# Patient Record
Sex: Female | Born: 1970 | Race: White | Hispanic: No | Smoking: Never smoker
Health system: Southern US, Community
[De-identification: ages and names within clinical notes are randomized; demographics above are authoritative.]

## PROBLEM LIST (undated history)

## (undated) DIAGNOSIS — K219 Gastro-esophageal reflux disease without esophagitis: Secondary | ICD-10-CM

## (undated) DIAGNOSIS — R609 Edema, unspecified: Secondary | ICD-10-CM

## (undated) DIAGNOSIS — B977 Papillomavirus as the cause of diseases classified elsewhere: Secondary | ICD-10-CM

## (undated) DIAGNOSIS — IMO0001 Reserved for inherently not codable concepts without codable children: Secondary | ICD-10-CM

## (undated) DIAGNOSIS — B379 Candidiasis, unspecified: Secondary | ICD-10-CM

## (undated) DIAGNOSIS — N6019 Diffuse cystic mastopathy of unspecified breast: Secondary | ICD-10-CM

## (undated) DIAGNOSIS — J329 Chronic sinusitis, unspecified: Secondary | ICD-10-CM

## (undated) DIAGNOSIS — T7840XA Allergy, unspecified, initial encounter: Secondary | ICD-10-CM

## (undated) DIAGNOSIS — F32A Depression, unspecified: Secondary | ICD-10-CM

## (undated) DIAGNOSIS — N390 Urinary tract infection, site not specified: Secondary | ICD-10-CM

## (undated) DIAGNOSIS — F329 Major depressive disorder, single episode, unspecified: Secondary | ICD-10-CM

## (undated) DIAGNOSIS — R569 Unspecified convulsions: Secondary | ICD-10-CM

## (undated) DIAGNOSIS — I77810 Thoracic aortic ectasia: Secondary | ICD-10-CM

## (undated) DIAGNOSIS — Q249 Congenital malformation of heart, unspecified: Secondary | ICD-10-CM

## (undated) DIAGNOSIS — S0300XA Dislocation of jaw, unspecified side, initial encounter: Secondary | ICD-10-CM

## (undated) HISTORY — DX: Allergy, unspecified, initial encounter: T78.40XA

## (undated) HISTORY — DX: Gastro-esophageal reflux disease without esophagitis: K21.9

## (undated) HISTORY — DX: Major depressive disorder, single episode, unspecified: F32.9

## (undated) HISTORY — DX: Diffuse cystic mastopathy of unspecified breast: N60.19

## (undated) HISTORY — DX: Papillomavirus as the cause of diseases classified elsewhere: B97.7

## (undated) HISTORY — DX: Congenital malformation of heart, unspecified: Q24.9

## (undated) HISTORY — DX: Edema, unspecified: R60.9

## (undated) HISTORY — DX: Depression, unspecified: F32.A

## (undated) HISTORY — DX: Dislocation of jaw, unspecified side, initial encounter: S03.00XA

## (undated) HISTORY — PX: EYE SURGERY: SHX253

## (undated) HISTORY — DX: Thoracic aortic ectasia: I77.810

---
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
## 2001-05-11 ENCOUNTER — Other Ambulatory Visit: Admission: RE | Admit: 2001-05-11 | Discharge: 2001-05-11 | Payer: Self-pay | Admitting: Obstetrics and Gynecology

## 2004-02-28 ENCOUNTER — Ambulatory Visit (HOSPITAL_COMMUNITY): Admission: RE | Admit: 2004-02-28 | Discharge: 2004-02-28 | Payer: Self-pay | Admitting: Cardiovascular Disease

## 2005-02-09 ENCOUNTER — Ambulatory Visit: Payer: Self-pay | Admitting: Family Medicine

## 2005-04-30 ENCOUNTER — Ambulatory Visit: Payer: Self-pay | Admitting: Family Medicine

## 2005-07-07 ENCOUNTER — Ambulatory Visit: Payer: Self-pay | Admitting: Cardiovascular Disease

## 2006-05-03 ENCOUNTER — Ambulatory Visit: Payer: Self-pay

## 2006-05-03 ENCOUNTER — Ambulatory Visit: Payer: Self-pay | Admitting: Cardiovascular Disease

## 2006-05-03 ENCOUNTER — Encounter: Payer: Self-pay | Admitting: Cardiovascular Disease

## 2006-06-12 ENCOUNTER — Encounter: Payer: Self-pay | Admitting: Family Medicine

## 2006-07-07 ENCOUNTER — Ambulatory Visit: Payer: Self-pay | Admitting: Family Medicine

## 2007-05-10 ENCOUNTER — Ambulatory Visit: Payer: Self-pay | Admitting: Cardiovascular Disease

## 2007-05-26 ENCOUNTER — Encounter: Admission: RE | Admit: 2007-05-26 | Discharge: 2007-05-26 | Payer: Self-pay | Admitting: Obstetrics and Gynecology

## 2007-06-13 LAB — CONVERTED CEMR LAB: Pap Smear: NORMAL

## 2007-09-19 ENCOUNTER — Telehealth: Payer: Self-pay | Admitting: Family Medicine

## 2007-10-13 HISTORY — PX: CRYOTHERAPY: SHX1416

## 2007-11-02 ENCOUNTER — Encounter: Payer: Self-pay | Admitting: Family Medicine

## 2007-11-02 DIAGNOSIS — J309 Allergic rhinitis, unspecified: Secondary | ICD-10-CM | POA: Insufficient documentation

## 2007-11-02 DIAGNOSIS — Q231 Congenital insufficiency of aortic valve: Secondary | ICD-10-CM

## 2007-11-02 DIAGNOSIS — F341 Dysthymic disorder: Secondary | ICD-10-CM | POA: Insufficient documentation

## 2007-11-02 DIAGNOSIS — Z8659 Personal history of other mental and behavioral disorders: Secondary | ICD-10-CM | POA: Insufficient documentation

## 2007-11-11 ENCOUNTER — Ambulatory Visit: Payer: Self-pay | Admitting: Family Medicine

## 2007-11-14 LAB — CONVERTED CEMR LAB
AST: 19 units/L (ref 0–37)
BUN: 7 mg/dL (ref 6–23)
Basophils Absolute: 0 10*3/uL (ref 0.0–0.1)
Basophils Relative: 0.5 % (ref 0.0–1.0)
Bilirubin, Direct: 0.1 mg/dL (ref 0.0–0.3)
Calcium: 9.1 mg/dL (ref 8.4–10.5)
Chloride: 104 meq/L (ref 96–112)
Creatinine, Ser: 0.6 mg/dL (ref 0.4–1.2)
Eosinophils Absolute: 0.1 10*3/uL (ref 0.0–0.6)
Eosinophils Relative: 2 % (ref 0.0–5.0)
HCT: 39.5 % (ref 36.0–46.0)
HDL: 44.5 mg/dL (ref >39.0)
Lymphocytes Relative: 32.4 % (ref 12.0–46.0)
MCHC: 33 g/dL (ref 30.0–36.0)
Monocytes Relative: 11.3 % — ABNORMAL HIGH (ref 3.0–11.0)
Neutro Abs: 3.5 10*3/uL (ref 1.4–7.7)
Neutrophils Relative %: 53.8 % (ref 43.0–77.0)
Potassium: 3.6 meq/L (ref 3.5–5.1)
RBC: 4.27 M/uL (ref 3.87–5.11)
RDW: 12.5 % (ref 11.5–14.6)
VLDL: 14 mg/dL (ref 0–40)

## 2008-03-23 ENCOUNTER — Telehealth: Payer: Self-pay | Admitting: Family Medicine

## 2008-06-12 LAB — CONVERTED CEMR LAB: Pap Smear: NORMAL

## 2008-06-28 ENCOUNTER — Telehealth (INDEPENDENT_AMBULATORY_CARE_PROVIDER_SITE_OTHER): Payer: Self-pay | Admitting: *Deleted

## 2008-07-05 ENCOUNTER — Encounter: Payer: Self-pay | Admitting: Family Medicine

## 2008-10-16 ENCOUNTER — Ambulatory Visit: Payer: Self-pay | Admitting: Cardiovascular Disease

## 2008-10-16 ENCOUNTER — Ambulatory Visit: Payer: Self-pay

## 2008-10-16 ENCOUNTER — Encounter: Payer: Self-pay | Admitting: Cardiovascular Disease

## 2008-11-13 ENCOUNTER — Ambulatory Visit: Payer: Self-pay | Admitting: Family Medicine

## 2008-11-14 LAB — CONVERTED CEMR LAB
AST: 16 units/L (ref 0–37)
Basophils Absolute: 0.1 10*3/uL (ref 0.0–0.1)
Basophils Relative: 0.8 % (ref 0.0–3.0)
Chloride: 108 meq/L (ref 96–112)
Cholesterol: 148 mg/dL (ref 0–200)
Creatinine, Ser: 0.7 mg/dL (ref 0.4–1.2)
Eosinophils Absolute: 0.2 10*3/uL (ref 0.0–0.7)
Eosinophils Relative: 3 % (ref 0.0–5.0)
GFR calc non Af Amer: 99 mL/min
HCT: 39.6 % (ref 36.0–46.0)
HDL: 43.5 mg/dL (ref >39.0)
Hemoglobin: 13.6 g/dL (ref 12.0–15.0)
LDL Cholesterol: 96 mg/dL (ref 0–99)
Lymphocytes Relative: 31.5 % (ref 12.0–46.0)
MCV: 91.3 fL (ref 78.0–100.0)
Monocytes Absolute: 1 10*3/uL (ref 0.1–1.0)
Neutrophils Relative %: 52.2 % (ref 43.0–77.0)
RDW: 12.7 % (ref 11.5–14.6)
Sodium: 140 meq/L (ref 135–145)
Total Bilirubin: 0.8 mg/dL (ref 0.3–1.2)
Total CHOL/HDL Ratio: 3.4
Total Protein: 6.4 g/dL (ref 6.0–8.3)
Triglycerides: 45 mg/dL (ref 0–149)
VLDL: 9 mg/dL (ref 0–40)

## 2009-03-25 ENCOUNTER — Telehealth: Payer: Self-pay | Admitting: Family Medicine

## 2009-06-12 ENCOUNTER — Telehealth: Payer: Self-pay | Admitting: Family Medicine

## 2009-06-19 ENCOUNTER — Telehealth: Payer: Self-pay | Admitting: Family Medicine

## 2009-06-24 ENCOUNTER — Telehealth: Payer: Self-pay | Admitting: Family Medicine

## 2009-11-14 ENCOUNTER — Ambulatory Visit: Payer: Self-pay | Admitting: Family Medicine

## 2009-11-14 DIAGNOSIS — R87810 Cervical high risk human papillomavirus (HPV) DNA test positive: Secondary | ICD-10-CM

## 2009-11-20 ENCOUNTER — Encounter: Payer: Self-pay | Admitting: Family Medicine

## 2009-12-11 ENCOUNTER — Encounter: Payer: Self-pay | Admitting: Family Medicine

## 2009-12-11 ENCOUNTER — Ambulatory Visit: Payer: Self-pay | Admitting: Family Medicine

## 2009-12-11 LAB — HM MAMMOGRAPHY: HM Mammogram: NORMAL

## 2009-12-19 ENCOUNTER — Encounter: Payer: Self-pay | Admitting: Family Medicine

## 2010-11-09 LAB — CONVERTED CEMR LAB: Pap Smear: NORMAL

## 2010-11-11 NOTE — Letter (Signed)
Summary: Results Follow up Letter  [place] [facility]  [address] [place], [place] [address]   Phone: [phone]  Fax: [phone]    11/20/2009 MRN: [id number]  Elizabeth Hayden [address] [place], [place]  [address]  Dear Ms. Hayden,  The following are the results of your recent test(s):  Test         Result    Pap Smear:        Normal _X____  Not Normal _____ Comments:Please repeat in one year. ______________________________________________________ Cholesterol: LDL(Bad cholesterol):         Your goal is less than:         HDL (Good cholesterol):       Your goal is more than: Comments:  ______________________________________________________ Mammogram:        Normal _____  Not Normal _____ Comments:  ___________________________________________________________________ Hemoccult:        Normal _____  Not normal _______ Comments:    _____________________________________________________________________ Other Tests:    We routinely do not discuss normal results over the telephone.  If you desire a copy of the results, or you have any questions about this information we can discuss them at your next office visit.   Sincerely,    Roxy Manns, MD   MT/ri

## 2010-11-11 NOTE — Letter (Signed)
Summary: Results Follow up Letter  [place] [facility]  [address] [place], [place] [address]   Phone: [phone]  Fax: [phone]    12/19/2009 MRN: [id number]    Elizabeth Hayden [address] [place], [place]  [address]    Dear Ms. Hayden,  The following are the results of your recent test(s):  Test         Result    Pap Smear:        Normal _____  Not Normal _____ Comments: ______________________________________________________ Cholesterol: LDL(Bad cholesterol):         Your goal is less than:         HDL (Good cholesterol):       Your goal is more than: Comments:  ______________________________________________________ Mammogram:        Normal __X___  Not Normal _____ Comments:Please repeat in one year.  ___________________________________________________________________ Hemoccult:        Normal _____  Not normal _______ Comments:    _____________________________________________________________________ Other Tests:    We routinely do not discuss normal results over the telephone.  If you desire a copy of the results, or you have any questions about this information we can discuss them at your next office visit.   Sincerely,    Idamae Schuller Tower,MD  MT/ri

## 2010-11-11 NOTE — Assessment & Plan Note (Signed)
Summary: CPX/CLE   Vital Signs:  Patient profile:   40 year old female Height:      67.75 inches Weight:      158 pounds BMI:     24.29 Temp:     98.2 degrees F oral Pulse rate:   72 / minute Pulse rhythm:   regular BP sitting:   110 / 74  (left arm) Cuff size:   regular  Vitals Entered By: Lowella Petties CMA (November 14, 2009 9:50 AM) CC: 30 minute check up   History of Present Illness: here for health mt exam   works in new job - at [employer]-- much less stressful/likes it   is feeling better overall  husb is getting eval for sleep apnea- is happy about that  she will sleep better without the snoring   is not exercising yet  much more healthy diet -- and no more fried foods more veg and chicken  mvi and also now on some calcium with vit   wt is stable-- but she wants to weigh less  bp very good at 110/74  has remote hx of abn pap and leep  pap was 09- will do that today  knows she has hpv high risk  last colposcopy has been a while and last pap was nl   mam 9/08- needs to get that scheduled  has had some lumps in past - [place] , fibrocystic  on self exam-- lumps tend to come and go  Td 05  did get a flu shot at work this year   Allergies: 1)  Ace Inhibitors  Past History:  Past Surgical History: Last updated: 11/02/2007 Coarctation of aorta,  surgery (1975) Eye surgery- strabismus (1985) Abn  paps- colposcopy, biopsy, ? 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 Eyes:  Denies blurring and eye pain. CV:  Denies chest pain or discomfort, palpitations, shortness of breath with exertion, and swelling of feet. Resp:  Denies cough and wheezing. GI:  Denies abdominal pain, change in bowel habits, and indigestion. MS:  Complains of joint pain and stiffness; denies cramps and muscle weakness. Derm:  Denies lesion(s), poor wound healing, and rash. Neuro:  Denies numbness and tingling. Psych:  Denies anxiety and depression. Endo:  Denies cold intolerance, excessive thirst, excessive urination, and heat intolerance. Heme:  Denies abnormal bruising and bleeding.  Physical Exam  General:  Well-developed,well-nourished,in no acute distress; alert,appropriate and cooperative throughout examination Head:  normocephalic, atraumatic, and no abnormalities observed.   Eyes:  vision grossly intact, pupils equal, pupils round, and pupils reactive to light.   Ears:  R ear normal and L ear normal.   Nose:  no nasal discharge.   Mouth:  pharynx pink and moist.   Neck:  supple with full rom and no masses or thyromegally, no JVD or carotid bruit  Chest Wall:  No deformities, masses, or  tenderness noted. Breasts:  No mass, nodules, thickening, tenderness, bulging, retraction, inflamation, nipple discharge or skin changes noted.   Lungs:  Normal respiratory effort, chest expands symmetrically. Lungs are clear to auscultation, no crackles or wheezes. Heart:  Normal rate and regular rhythm. S1 and S2 normal without gallop, murmur, click, rub or other extra sounds. Abdomen:  Bowel sounds positive,abdomen soft and non-tender without masses, organomegaly or hernias noted. no renal bruits  Genitalia:  Normal introitus for age, no external lesions,  no vaginal discharge, mucosa pink and moist, no vaginal or cervical lesions, no vaginal atrophy, no friaility or hemorrhage, normal uterus size and position, no adnexal masses or tenderness Msk:  No deformity or scoliosis noted of thoracic or lumbar spine.  no acute joint changes Pulses:  R and L carotid,radial,femoral,dorsalis pedis and posterior tibial pulses are full and equal bilaterally Extremities:  No clubbing, cyanosis, edema, or deformity noted with normal full range of motion of all joints.   Neurologic:  sensation intact to light touch, gait normal, and DTRs symmetrical and normal.   Skin:  Intact without suspicious lesions or rashes Cervical Nodes:  No lymphadenopathy noted Axillary Nodes:  No palpable lymphadenopathy Inguinal Nodes:  No significant adenopathy Psych:  normal affect, talkative and pleasant    Impression & Recommendations:  Problem # 1:  HEALTH MAINTENANCE EXAM (ICD-V70.0) Assessment Comment Only reviewed health habits including diet, exercise and skin cancer prevention reviewed health maintenance list and family history will send lab to [facility] adv to try to get regular exercise   Problem # 2:  ROUTINE GYNECOLOGICAL EXAMINATION (ICD-V72.31) Assessment: Comment Only with hx of hpv high risk  last 2 paps nl  check today and adv   Problem # 3:  DEPRESSION (ICD-311) Assessment: Improved overall is doing well with less stress  wants to try the generic wellbutrin xl again -- in past did not work as well, but price is going up  given px, will update  recommended good sleep and exercise  Her updated medication list for this problem includes:    Wellbutrin Xl 300 Mg Tb24 (Bupropion hcl) .Marland Kitchen... 1 by mouth once daily  Complete Medication List: 1)  Wellbutrin Xl 300 Mg Tb24 (Bupropion hcl) .Marland Kitchen.. 1 by mouth once daily 2)  Zantac 150 Mg Tabs (Ranitidine hcl) .... One by mouth daily prn  Other Orders: Radiology Referral (Radiology)  Patient Instructions: 1)   keep up the healthy diet  2)  It is important that you exercise reguarly at least 20 minutes 5 times a week. If you develop chest pain, have severe difficulty breathing, or feel very tired, stop exercising immediately and seek medical attention.  3)  labs sent to [facility] today Prescriptions: WELLBUTRIN XL 300 MG  TB24 (BUPROPION HCL) 1 by mouth once daily Brand medically necessary #90 x 3   Entered and Authorized by:   Judith Part MD   Signed by:   Judith Part MD on 11/14/2009   Method used:   Print then Give to Patient   RxID:   [id number]   Handout requested.   Prior Medications (reviewed today): WELLBUTRIN XL 300 MG  TB24 (BUPROPION HCL) 1 by mouth once daily ZANTAC 150 MG  TABS (RANITIDINE HCL) one by mouth daily prn Current Allergies: ACE INHIBITORS    Prevention & Chronic Care Immunizations   Influenza vaccine: given  (07/15/2007)    Tetanus booster: 04/18/2004: Td    Pneumococcal vaccine: Not documented  Other Screening   Pap smear: normal  (  06/12/2008)    Mammogram: normal  (06/13/2007)   Smoking status: quit  (11/02/2007)  Lipids   Total Cholesterol: 148  (11/13/2008)   LDL: 96  (11/13/2008)   LDL Direct: Not documented   HDL: 43.5  (11/13/2008)   Triglycerides: 45  (11/13/2008)

## 2010-11-11 NOTE — Miscellaneous (Signed)
Summary: pap smear prev care  Clinical Lists Changes  Observations: Added new observation of PAP DUE: 11/2010 (11/20/2009 16:37) Added new observation of PAP SMEAR: normal (11/14/2009 16:38)      Preventive Care Screening  Pap Smear:    Date:  11/14/2009    Next Due:  11/2010    Results:  normal  Letter mailed to patient as instructed. Put on patient's preventive care form. Lewanda Rife LPN  November 20, 2009 4:38 PM

## 2010-11-11 NOTE — Miscellaneous (Signed)
Summary: mammogram screening  Clinical Lists Changes  Observations: Added new observation of MAMMO DUE: 12/2010 (12/19/2009 13:49) Added new observation of MAMMOGRAM: normal (12/11/2009 13:50)      Preventive Care Screening  Mammogram:    Date:  12/11/2009    Next Due:  12/2010    Results:  normal

## 2011-01-08 ENCOUNTER — Other Ambulatory Visit: Payer: Self-pay | Admitting: Family Medicine

## 2011-01-08 NOTE — Telephone Encounter (Signed)
Pt needs to call for appt. 

## 2011-02-17 ENCOUNTER — Telehealth: Payer: Self-pay | Admitting: *Deleted

## 2011-02-17 NOTE — Telephone Encounter (Signed)
Pt is [facility] employee and has cpx scheduled. She request an order to take to [facility] for cpx labs.

## 2011-02-17 NOTE — Telephone Encounter (Signed)
Left message for pt to call back  °

## 2011-02-17 NOTE — Telephone Encounter (Signed)
Order done on px pad for PE labs in IN box

## 2011-02-18 NOTE — Telephone Encounter (Signed)
Left message for pt to call back  °

## 2011-02-18 NOTE — Telephone Encounter (Signed)
Patient notified as instructed by telephone. Pt requested lab order mailed to her home address. Copy of order sent for scanning.

## 2011-02-24 NOTE — Assessment & Plan Note (Signed)
[facility]                            CARDIOLOGY OFFICE NOTE   Elizabeth Hayden, Elizabeth Hayden                    MRN:          [id number]  DATE:05/10/2007                            DOB:          [birth year]    Elizabeth Hayden returns today for followup.  She has a history of bicuspid  aortic valve with coarctation repair.   She has been doing well, she has not had any significant PND, orthopnea,  chest pain or palpitations.  She has had a long standing history of  chronic edema, this tends to be generalizes body edema both in her legs,  and her hands and in her face.  She has been on Lasix for a long time,  as far as I can tell there has been not an etiology to this.  She is  being seen by Dr. Milinda Antis, this is clearly not related to her heart, she  says her thyroid has been fine.   REVIEW OF SYSTEMS:  Otherwise negative.   Patient's last echocardiogram which I reviewed the images for prior to  her visit showed an EF of 55% - 60%.  She had a bicuspid aortic valve  with a mean gradient of 5, trivial aortic insufficiency and no evidence  of restenosis of her coarc repair by suprasternal notch imaging.   Her teeth are in good shape, she sees a Dr. Judithann Sheen in [place] twice  a year.  Although she dose not have to, her and her dentist prefer to  continue SBE prophylaxis taking 2 grams of amoxicillin an hour before  any procedure.   CURRENT MEDICATIONS:  1. Wellbutrin 300 a day.  2. Lasix 60 a day.  3. Zantac 150 a day.  4. Multivitamin.   She continues to be under a lot of stress.  She works full time at [employer].  She has a blended family as she was remarried  last year.  There are 3 children living at home, 33 and 75 year old  girls who get along well; she has a 1-year-old down syndrome baby named  Elizabeth Hayden who has had an endocardial cushion defect repair, he is doing  fairly well at schools in [place] now; there is a 28 year old child  who  does not live at the house.   EXAMINATION:  Remarkable for healthy-appearing middle-aged white female  in no distress.  She is afebrile, her weight is 157, respiratory rate is  14, blood pressure is 114/80, pulse 73 and regular.  HEENT:  Normal.  There is no thyromegaly or lymphadenopathy, no bruits,  no JVP elevation.  LUNGS:  Clear with good diaphragmatic motion and no wheezing.  There is  no coarctation murmur in the back.  There is an S1 and S2, I think there  is an opening snap from her bicuspid valve.  There is no aortic  insufficiency murmur, there is a very short systolic ejection murmur.  PMI is normal.  ABDOMEN:  Benign, there is no radial femoral delay, there is no  abdominal aortic aneurysm, no tenderness.  Bowel sounds are positive, no  hepatosplenomegaly or  hepatojugular reflux.  Distal pulses are intact with no edema.  NEURO:  Nonfocal.  SKIN:  Warm and dry.  There is no muscular weakness.   Her EKG shows sinus rhythm with left axis deviation and poor R-wave  progression.   IMPRESSION:  1. History of coarctation repair, no lower extremity weakness.      Followup 2D echocardiogram to further assess.  No murmur in the      back, I suspect it is stable.  2. History of bicuspid aortic valve with trivial aortic stenosis and      aortic insufficiency, followup echo clinically it sounds fine and      she prefers to continue subacute bacterial endocarditis      prophylaxis.  3. History of moderate aortic root dilatation.  The patient's aortic      valve measured 4 cm at the sinus of Valsalva with a maximum      diameter distal of the __________ junction of 4.4 cm.  Her last MRI      was in May of 2005, I suspect we will recheck one next year unless      her aortic root appears large on echo.  4. History of generalized edema, not well characterized, clearly not      related to her heart.  Continue Lasix 60 mg a day, follow up with      Dr. Milinda Antis.  5. History of  depression, continue Wellbutrin 300 a day, seems to be      doing better.  Always a lot of stress in her life due to her      handicapped child but she seems to be tolerating it well.  6. History of reflux and gastroesophageal reflux disease, continue      Zantac p.r.n.   I will see her back in a year.     Noralyn Pick. Eden Emms, MD, [facility]  Electronically Signed    PCN/MedQ  DD: 05/10/2007  DT: 05/11/2007  Job #: [id number]

## 2011-02-27 NOTE — Assessment & Plan Note (Signed)
[facility]                              CARDIOLOGY OFFICE NOTE   NAME:Fickett, SHACOLA SCHUSSLER                      MRN:          [id number]  DATE:05/03/2006                            DOB:          1971/05/30    Shonette returns today for follow-up.  She has a bicuspid valve with previous  coarctation repair.  Interestingly, she has a son who has had an AV canal  repair who was followed at [facility].   She is doing well.  She has recently been remarried.  She is not having any  significant PND, orthopnea, palpitations or chest pain.   We did an MRA of her back in 2005 and she had an aortic root that was in the  4 to 4.1 cm range.  She had an echo today which confirmed this. She does  have a bicuspid aortic valve but there is no significant regurgitation or  stenosis.  She has mild MR.   I talked to her about some of the new SBE prophylaxis recommendations and I  do not think she would need them given the new guide lines.   Her dentition is in good shape.   PHYSICAL EXAMINATION:  GENERAL APPEARANCE:  She looks well.  VITAL SIGNS:  Blood pressure is 115/70, pulse 70 and regular.  NECK:  Carotids were normal.  LUNGS:  Clear.  CARDIOVASCULAR:  There was an S1 and S2 without an audible murmur.  ABDOMEN:  Benign.  EXTREMITIES:  Lower extremities with good pulses and no edema.   IMPRESSION:  Stable bicuspid aortic valve with no significant stenosis or  leakiness, previous coarctation repair, no evidence of restenosis by Doppler  today and by MRI in 2005.  I will see her back in a year.                               Noralyn Pick. Eden Emms, MD, [facility]    PCN/MedQ  DD:  05/03/2006  DT:  05/03/2006  Job #:  [id number]

## 2011-03-03 ENCOUNTER — Other Ambulatory Visit: Payer: Self-pay | Admitting: *Deleted

## 2011-03-03 ENCOUNTER — Encounter: Payer: Self-pay | Admitting: Family Medicine

## 2011-03-03 NOTE — Telephone Encounter (Signed)
Opened error

## 2011-03-04 ENCOUNTER — Ambulatory Visit (INDEPENDENT_AMBULATORY_CARE_PROVIDER_SITE_OTHER): Payer: 59 | Admitting: Family Medicine

## 2011-03-04 ENCOUNTER — Encounter: Payer: Self-pay | Admitting: Family Medicine

## 2011-03-04 VITALS — BP 128/84 | HR 84 | Temp 98.9°F | Ht 67.75 in | Wt 155.2 lb

## 2011-03-04 DIAGNOSIS — N63 Unspecified lump in unspecified breast: Secondary | ICD-10-CM | POA: Insufficient documentation

## 2011-03-04 NOTE — Progress Notes (Signed)
  Subjective:    Patient ID: Elizabeth Hayden, female    DOB: [birth year], 40 y.o.   MRN: [id number]  HPI Here for eval of lump in L breast  Noticed it in winter -- was sore and got better and now bigger and sore again  No skin change or rash No nipple d/c  Noticed one in the past and it went away   Cyst in R breast in past  Fibrocystic breasts   Mam 3/11- was normal   Past Medical History  Diagnosis Date  . Allergy   . Depression   . TMJ (dislocation of temporomandibular joint)     Wears a night gear   . Congenital heart disease     bicuspid aortic valve with coarctation repair 04/1974  . Aortic root dilation   . GERD (gastroesophageal reflux disease)   . Edema   . Fibrocystic breast   . HPV (human papilloma virus) infection     History   Social History  . Marital Status: Married    Spouse Name: N/A    Number of Children: 2  . Years of Education: N/A   Occupational History  . Not on file.   Social History Main Topics  . Smoking status: Never Smoker   . Smokeless tobacco: Not on file  . Alcohol Use: Not on file  . Drug Use: Not on file  . Sexually Active: Not on file   Other Topics Concern  . Not on file   Social History Narrative  . No narrative on file   drinks one diet coke per day  Family History  Problem Relation Age of Onset  . Hypertension Father   . Diabetes Father   . Cancer Father     lung          Review of Systems Review of Systems  Constitutional: Negative for fever, appetite change, fatigue and unexpected weight change.  Eyes: Negative for pain and visual disturbance.  Respiratory: Negative for cough and shortness of breath.   Cardiovascular: Negative.for cp or sob or edema    Gastrointestinal: Negative for nausea, diarrhea and constipation.  Genitourinary: Negative for urgency and frequency.  Skin: Negative for pallor.  Neurological: Negative for weakness, light-headedness, numbness and headaches.  Hematological: Negative for  adenopathy. Does not bruise/bleed easily.  Psychiatric/Behavioral: Negative for dysphoric mood. The patient is not nervous/anxious.          Objective:   Physical Exam  Constitutional: She appears well-developed and well-nourished.  HENT:  Head: Normocephalic and atraumatic.  Eyes: Conjunctivae and EOM are normal. Pupils are equal, round, and reactive to light.  Neck: Normal range of motion. Neck supple. No thyromegaly present.  Cardiovascular: Normal rate, regular rhythm and normal heart sounds.   Pulmonary/Chest: Effort normal and breath sounds normal.  Abdominal: Soft. Bowel sounds are normal.  Genitourinary:       bilat breasts are very dense in texture No skin changes or nipple d/c L breast large 2-3 cm oval mass with 1 cm oval mass next to it -- slt tender (consistent with cysts)   Musculoskeletal: She exhibits no edema.  Lymphadenopathy:    She has no cervical adenopathy.  Neurological: She is alert. She has normal reflexes.  Skin: Skin is warm and dry. No rash noted. No erythema. No pallor.  Psychiatric: She has a normal mood and affect.          Assessment & Plan:

## 2011-03-04 NOTE — Assessment & Plan Note (Signed)
L large and tender breast lump in pt with hx of fibrocystic breasts  sched bilat dx mam and L breast US and will update with results Will avoid tight bras

## 2011-03-04 NOTE — Patient Instructions (Signed)
We will do mammogram and ultrasound referral at check out  If symptoms change- let me know  We will update you as soon as we get a report Try to avoid caffiene

## 2011-03-17 ENCOUNTER — Ambulatory Visit
Admission: RE | Admit: 2011-03-17 | Discharge: 2011-03-17 | Disposition: A | Discharge status: Home / Self Care | Payer: 59 | Source: Ambulatory Visit | Attending: Family Medicine | Admitting: Family Medicine

## 2011-03-17 DIAGNOSIS — N63 Unspecified lump in unspecified breast: Secondary | ICD-10-CM

## 2011-04-17 ENCOUNTER — Other Ambulatory Visit: Payer: Self-pay | Admitting: Family Medicine

## 2011-04-17 DIAGNOSIS — N6001 Solitary cyst of right breast: Secondary | ICD-10-CM

## 2011-04-20 ENCOUNTER — Telehealth: Payer: Self-pay

## 2011-04-20 NOTE — Telephone Encounter (Signed)
Pt had called back to get daughter's lab results and pt wanted Dr Milinda Antis to know that she has appt for ultra sound of right breast on 04/21/11.

## 2011-04-20 NOTE — Telephone Encounter (Signed)
[place] did get her daughter's lab result.

## 2011-04-20 NOTE — Telephone Encounter (Signed)
Thanks for the update  Please make sure she got the lab results -thanks

## 2011-04-21 ENCOUNTER — Ambulatory Visit
Admission: RE | Admit: 2011-04-21 | Discharge: 2011-04-21 | Disposition: A | Discharge status: Home / Self Care | Payer: 59 | Source: Ambulatory Visit | Attending: Family Medicine | Admitting: Family Medicine

## 2011-04-21 DIAGNOSIS — N6001 Solitary cyst of right breast: Secondary | ICD-10-CM

## 2011-05-19 ENCOUNTER — Encounter: Payer: Self-pay | Admitting: Family Medicine

## 2011-05-19 ENCOUNTER — Ambulatory Visit (INDEPENDENT_AMBULATORY_CARE_PROVIDER_SITE_OTHER): Payer: 59 | Admitting: Family Medicine

## 2011-05-19 VITALS — BP 114/76 | HR 72 | Temp 97.9°F | Ht 67.75 in | Wt 152.8 lb

## 2011-05-19 DIAGNOSIS — Z0000 Encounter for general adult medical examination without abnormal findings: Secondary | ICD-10-CM

## 2011-05-19 DIAGNOSIS — Z01419 Encounter for gynecological examination (general) (routine) without abnormal findings: Secondary | ICD-10-CM

## 2011-05-19 NOTE — Assessment & Plan Note (Signed)
Reviewed health habits including diet and exercise and skin cancer prevention Also reviewed health mt list, fam hx and immunizations  Reviewed wellness labs today

## 2011-05-19 NOTE — Patient Instructions (Signed)
Pap today  Keep up healthy diet and exercise - your weight is very good today  Labs look good including cholesterol

## 2011-05-19 NOTE — Progress Notes (Signed)
Subjective:    Patient ID: Elizabeth Hayden, female    DOB: [birth year], 40 y.o.   MRN: [id number]  HPI Here for health mt exam and to review chronic med problems   Wt is down 3 lb Wishes she was thinner- hx of body image issues bmi is 23     Pap-- last time she was here  Needs pap today  hpv in past  Td 05  Mam 6/12- had to have some [place] for cysts Was anxiety provoking  Self exam - no lumps lately   Is feeling good - and doing well  Went on a cruise - very relaxing   Patient Active Problem List  Diagnoses  . EATING DISORDER  . DEPRESSION  . CARDIOMYOPATHY, HYPERTROPHIC, OBSTRUCTIVE  . ALLERGIC RHINITIS  . BICUSPID AORTIC VALVE  . CERV HIGH RISK HUMAN PAPILLOMAVIRUS DNA TEST POS  . Breast lump  . Gynecological examination  . Routine general medical examination at a health care facility   Past Medical History  Diagnosis Date  . Allergy   . Depression   . TMJ (dislocation of temporomandibular joint)     Wears a night gear   . Congenital heart disease     bicuspid aortic valve with coarctation repair 04/1974  . Aortic root dilation   . GERD (gastroesophageal reflux disease)   . Edema   . Fibrocystic breast   . HPV (human papilloma virus) infection    Past Surgical History  Procedure Date  . Coarctation of aorta excision 1975  . Eye surgery    History  Substance Use Topics  . Smoking status: Never Smoker   . Smokeless tobacco: Not on file  . Alcohol Use: Not on file   Family History  Problem Relation Age of Onset  . Hypertension Father   . Diabetes Father   . Cancer Father     lung   . Breast cancer Maternal Grandmother   . Leukemia Maternal Grandmother   . Ovarian cancer Paternal Grandmother    Allergies  Allergen Reactions  . Ace Inhibitors     REACTION: rash and cough   Current Outpatient Prescriptions on File Prior to Visit  Medication Sig Dispense Refill  . buPROPion (WELLBUTRIN XL) 300 MG 24 hr tablet TAKE 1 TABLET BY MOUTH ONCE A DAY  90  tablet  0  . CALCIUM-VITAMIN D PO Take 2 tablets by mouth daily.        . Multiple Vitamin (MULTIVITAMIN) capsule Take 1 capsule by mouth daily.        . ranitidine (ZANTAC) 150 MG tablet Take 150 mg by mouth daily.             Review of Systems Review of Systems  Constitutional: Negative for fever, appetite change, fatigue and unexpected weight change.  Eyes: Negative for pain and visual disturbance.  Respiratory: Negative for cough and shortness of breath.   Cardiovascular: Negative.for cp or palpitations   Gastrointestinal: Negative for nausea, diarrhea and constipation.  Genitourinary: Negative for urgency and frequency.  Skin: Negative for pallor. or rash  Neurological: Negative for weakness, light-headedness, numbness and headaches.  Hematological: Negative for adenopathy. Does not bruise/bleed easily.  Psychiatric/Behavioral: Negative for dysphoric mood. The patient is not nervous/anxious.         Objective:   Physical Exam  Constitutional: She appears well-developed and well-nourished. No distress.  HENT:  Head: Normocephalic and atraumatic.  Right Ear: External ear normal.  Left Ear: External ear normal.  Nose: Nose  normal.  Mouth/Throat: Oropharynx is clear and moist.  Eyes: Conjunctivae and EOM are normal. Pupils are equal, round, and reactive to light.  Neck: Normal range of motion. Neck supple. No JVD present. Carotid bruit is not present. Erythema present. No thyromegaly present.  Cardiovascular: Normal rate, regular rhythm, normal heart sounds and intact distal pulses.   Pulmonary/Chest: Effort normal and breath sounds normal. No respiratory distress. She has no wheezes.  Abdominal: Soft. Bowel sounds are normal. She exhibits no distension and no mass. There is no tenderness. There is no rebound.  Genitourinary: Vagina normal and uterus normal. No breast swelling, tenderness, discharge or bleeding. No vaginal discharge found.  Musculoskeletal: She exhibits no  edema and no tenderness.  Lymphadenopathy:    She has no cervical adenopathy.  Neurological: She is alert. She has normal reflexes. No cranial nerve deficit. Coordination normal.  Skin: Skin is warm and dry. No rash noted. No erythema. No pallor.  Psychiatric: She has a normal mood and affect.          Assessment & Plan:

## 2011-05-20 ENCOUNTER — Encounter: Payer: Self-pay | Admitting: Family Medicine

## 2011-05-23 NOTE — Assessment & Plan Note (Signed)
Annual exam with pap done Remote hx of hpv  No symptoms or problems

## 2011-05-28 ENCOUNTER — Encounter: Payer: Self-pay | Admitting: Family Medicine

## 2011-05-31 ENCOUNTER — Telehealth: Payer: Self-pay | Admitting: Family Medicine

## 2011-05-31 DIAGNOSIS — IMO0002 Reserved for concepts with insufficient information to code with codable children: Secondary | ICD-10-CM

## 2011-05-31 NOTE — Telephone Encounter (Signed)
I will go ahead and refer  

## 2011-05-31 NOTE — Telephone Encounter (Signed)
Message copied by Judy Pimple on Sun May 31, 2011 12:18 PM ------      Message from: Patience Musca      Created: Fri May 29, 2011  5:26 PM      Regarding: GYN referral       Please see result notes.      ----- Message -----         From: Roxy Manns, MD         Sent: 05/28/2011   9:00 PM           To: Yetta Glassman, LPN            Please let pt know that her pap shows some atypical cells and HPV       I want to get her referred to gyn for further eval      Let me know if agreeable

## 2011-06-09 ENCOUNTER — Other Ambulatory Visit: Payer: Self-pay | Admitting: *Deleted

## 2011-06-09 MED ORDER — BUPROPION HCL ER (XL) 300 MG PO TB24: ORAL_TABLET | ORAL | Status: DC

## 2011-06-09 NOTE — Telephone Encounter (Signed)
Will refill electronically  

## 2011-09-21 ENCOUNTER — Telehealth: Payer: Self-pay | Admitting: Cardiovascular Disease

## 2011-09-21 DIAGNOSIS — Q231 Congenital insufficiency of aortic valve: Secondary | ICD-10-CM

## 2011-09-21 DIAGNOSIS — I350 Nonrheumatic aortic (valve) stenosis: Secondary | ICD-10-CM

## 2011-09-21 NOTE — Telephone Encounter (Signed)
FU Call: Pt calling to schedule MRI, however there is not an order in pt chart to schedule MRI. Please return pt call to discuss further.

## 2011-09-21 NOTE — Telephone Encounter (Signed)
PER DR NISHAN PT NEEDS ECHO FOR DX AS  AND CARDIAC MRA FOR BICUSPID  VALVE AND AORTIC ROOT DILTATION

## 2011-09-21 NOTE — Telephone Encounter (Signed)
New message:  Pt stated she was to have an MRI this year and has not heard from anyone regarding this.  Wanted to know if this is suppose to be done this year.  Please call pt and advise.

## 2011-09-22 NOTE — Telephone Encounter (Signed)
PT AWARE OF TEST THAT NEED TO BE SCHEDULED  AND THAT   A SCHEDULER WILL CALL BACK TO MAKE APPTS./CY

## 2011-09-23 ENCOUNTER — Telehealth: Payer: Self-pay | Admitting: Cardiovascular Disease

## 2011-09-23 NOTE — Telephone Encounter (Signed)
Pt rtn debra's call re scheduling an MRI, pls call pt on cell  [phone]

## 2011-09-23 NOTE — Telephone Encounter (Signed)
Addended by: Freddi Starr on: 09/23/2011 03:43 PM   Modules accepted: Orders

## 2011-09-24 NOTE — Telephone Encounter (Signed)
Fu call Please call her back on her cell number [phone]

## 2011-09-24 NOTE — Telephone Encounter (Signed)
Spoke with pt, at home number. Aware we will call to schedule. She if off work the week between [employer] and the new year if we can schedule then. Will make geslia aware

## 2011-09-29 ENCOUNTER — Other Ambulatory Visit: Payer: Self-pay | Admitting: *Deleted

## 2011-09-29 ENCOUNTER — Telehealth: Payer: Self-pay | Admitting: *Deleted

## 2011-09-29 NOTE — Telephone Encounter (Signed)
PT AWARE INS WILL NOT PAY FOR MRI/ MRA  PER DR NISHAN  PROCEED WITH ECHO AND OFFICE VISIT   AND  WILL  DECIDE IF  NEEDS MRI/MRA AT THAT TIME  WILL GET PROPER  DOCUMENTATION  IF REQUIRES TEST .Elizabeth Hayden

## 2011-10-02 ENCOUNTER — Other Ambulatory Visit (HOSPITAL_COMMUNITY): Payer: 59

## 2011-10-07 ENCOUNTER — Ambulatory Visit (HOSPITAL_COMMUNITY): Payer: 59 | Attending: Cardiovascular Disease | Admitting: Radiology

## 2011-10-07 DIAGNOSIS — I77819 Aortic ectasia, unspecified site: Secondary | ICD-10-CM

## 2011-10-07 DIAGNOSIS — Q231 Congenital insufficiency of aortic valve: Secondary | ICD-10-CM | POA: Insufficient documentation

## 2011-10-07 DIAGNOSIS — I059 Rheumatic mitral valve disease, unspecified: Secondary | ICD-10-CM | POA: Insufficient documentation

## 2011-10-07 DIAGNOSIS — I359 Nonrheumatic aortic valve disorder, unspecified: Secondary | ICD-10-CM

## 2011-10-07 DIAGNOSIS — I350 Nonrheumatic aortic (valve) stenosis: Secondary | ICD-10-CM

## 2011-10-07 DIAGNOSIS — I079 Rheumatic tricuspid valve disease, unspecified: Secondary | ICD-10-CM | POA: Insufficient documentation

## 2011-10-15 ENCOUNTER — Inpatient Hospital Stay (HOSPITAL_COMMUNITY): Admission: RE | Admit: 2011-10-15 | Payer: 59 | Source: Ambulatory Visit

## 2011-10-15 ENCOUNTER — Other Ambulatory Visit (HOSPITAL_COMMUNITY): Payer: 59 | Admitting: Radiology

## 2011-10-15 ENCOUNTER — Other Ambulatory Visit: Payer: Self-pay | Admitting: *Deleted

## 2011-10-15 DIAGNOSIS — I77819 Aortic ectasia, unspecified site: Secondary | ICD-10-CM

## 2011-10-23 ENCOUNTER — Encounter: Payer: Self-pay | Admitting: *Deleted

## 2011-10-27 ENCOUNTER — Encounter: Payer: Self-pay | Admitting: Cardiovascular Disease

## 2011-10-30 ENCOUNTER — Other Ambulatory Visit (HOSPITAL_COMMUNITY): Payer: 59

## 2011-11-03 ENCOUNTER — Ambulatory Visit (HOSPITAL_COMMUNITY)
Admission: RE | Admit: 2011-11-03 | Discharge: 2011-11-03 | Disposition: A | Discharge status: Home / Self Care | Payer: 59 | Source: Ambulatory Visit | Attending: Cardiovascular Disease | Admitting: Cardiovascular Disease

## 2011-11-03 DIAGNOSIS — Q251 Coarctation of aorta: Secondary | ICD-10-CM | POA: Insufficient documentation

## 2011-11-03 DIAGNOSIS — I77819 Aortic ectasia, unspecified site: Secondary | ICD-10-CM

## 2011-11-03 DIAGNOSIS — Z9889 Other specified postprocedural states: Secondary | ICD-10-CM | POA: Insufficient documentation

## 2011-11-03 DIAGNOSIS — I77810 Thoracic aortic ectasia: Secondary | ICD-10-CM | POA: Insufficient documentation

## 2011-11-03 DIAGNOSIS — Q231 Congenital insufficiency of aortic valve: Secondary | ICD-10-CM | POA: Insufficient documentation

## 2011-11-03 MED ORDER — GADOBENATE DIMEGLUMINE 529 MG/ML IV SOLN
15.0000 mL | Freq: Once | INTRAVENOUS | Status: AC
  Administered 2011-11-03: 15 mL via INTRAVENOUS

## 2011-11-24 ENCOUNTER — Encounter: Payer: Self-pay | Admitting: Cardiovascular Disease

## 2011-11-24 ENCOUNTER — Ambulatory Visit (INDEPENDENT_AMBULATORY_CARE_PROVIDER_SITE_OTHER): Payer: 59 | Admitting: Cardiovascular Disease

## 2011-11-24 DIAGNOSIS — F329 Major depressive disorder, single episode, unspecified: Secondary | ICD-10-CM

## 2011-11-24 DIAGNOSIS — Q251 Coarctation of aorta: Secondary | ICD-10-CM

## 2011-11-24 DIAGNOSIS — F3289 Other specified depressive episodes: Secondary | ICD-10-CM

## 2011-11-24 DIAGNOSIS — I719 Aortic aneurysm of unspecified site, without rupture: Secondary | ICD-10-CM

## 2011-11-24 DIAGNOSIS — Q231 Congenital insufficiency of aortic valve: Secondary | ICD-10-CM

## 2011-11-24 NOTE — Assessment & Plan Note (Signed)
Stable no significant stenosis or regurgitation.  Murmur unchanged

## 2011-11-24 NOTE — Patient Instructions (Signed)
Your physician wants you to follow-up in: YEAR WITH DR NISHAN  You will receive a reminder letter in the mail two months in advance. If you don't receive a letter, please call our office to schedule the follow-up appointment.  Your physician recommends that you continue on your current medications as directed. Please refer to the Current Medication list given to you today. 

## 2011-11-24 NOTE — Assessment & Plan Note (Signed)
Surprised gradients are that low with minimal luminal diameter of 1.5 cm.  Asymptomatic S/P repair and no loud murmur on exam

## 2011-11-24 NOTE — Assessment & Plan Note (Signed)
Discussed with patient.  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

## 2011-11-24 NOTE — Assessment & Plan Note (Addendum)
Reactive Now with husband cheating on her likely to get worse Consider changing welbutrin to cymbalta

## 2011-11-24 NOTE — Progress Notes (Signed)
Patient ID: Elizabeth Hayden, female   DOB: Feb 09, 1971, 41 y.o.   MRN: [id number] Elizabeth Hayden returns today for followup. She has a history of bicuspid  aortic valve with coarctation repair.  She has been doing well, she has not had any significant PND, orthopnea,  chest pain or palpitations. She has had a long standing history of  chronic edema, this tends to be generalizes body edema both in her legs,  and her hands and in her face. No longer on lasix for edema  She is  being seen by Dr. Milinda Antis, this is clearly not related to her heart, she  says her thyroid has been fine.  Some stress at home.  Getting a divorce.  Has 64 yo downs child and 23 yo Daughter looking at colleges.  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 Wall thickness was normal. Systolic function was normal. The estimated ejection fraction was in the range of 60% to 65%. - Aorta: The aorta was moderately dilated.  Indicates coarctation but no mean gradient given. Had patient reimaged today with my supervision  Peak velocity: 1.35 Mean Gradient Peak Gradient:  ROS: Denies fever, malais, weight loss, blurry vision, decreased visual acuity, cough, sputum, SOB, hemoptysis, pleuritic pain, palpitaitons, heartburn, abdominal pain, melena, lower extremity edema, claudication, or rash.  All other systems reviewed and negative  General: Affect appropriate Healthy:  appears stated age HEENT: normal Neck supple with no adenopathy JVP normal no  bruits no thyromegaly Lungs clear with no wheezing and good diaphragmatic motion Heart:  S1/S2 short systolic  murmur, no rub, gallop or click no coarctation murmur PMI normal Abdomen: benighn, BS positve, no tenderness, no AAA no bruit.  No HSM or HJR Distal pulses intact with no bruits No edema Neuro non-focal Skin warm and dry No muscular weakness   Current Outpatient Prescriptions  Medication Sig Dispense Refill  . buPROPion (WELLBUTRIN XL) 300 MG 24 hr tablet Take one by mouth once daily  90 tablet  3  . CALCIUM-VITAMIN D PO Take 2 tablets by mouth daily.        . Multiple Vitamin (MULTIVITAMIN) capsule Take 1 capsule by mouth daily.        . ranitidine (ZANTAC) 150 MG tablet Take 150 mg by mouth as needed.         Allergies  Ace inhibitors  Electrocardiogram:  NSR 71 Nonspecific inferolateral ST/T wave changes. LAFB  Assessment and Plan

## 2011-12-02 NOTE — Progress Notes (Signed)
Addended by: Judithe Modest D on: 12/02/2011 10:33 AM   Modules accepted: Orders

## 2012-02-19 ENCOUNTER — Telehealth: Payer: Self-pay | Admitting: Family Medicine

## 2012-02-19 NOTE — Telephone Encounter (Signed)
We can draw here and send to [employer] -- would that be ok ? - then would flow into our system so I can compare results over time  Let me know

## 2012-02-19 NOTE — Telephone Encounter (Signed)
Patient has appointment for cpx on 07/25/12.  She works for [employer] and she asked if an order for lab work can be mailed to her,so she can get it done before her appointment.

## 2012-02-22 NOTE — Telephone Encounter (Signed)
Patient advised as instructed via telephone, she will have labs done at her appt.

## 2012-06-02 ENCOUNTER — Other Ambulatory Visit: Payer: Self-pay

## 2012-06-02 MED ORDER — BUPROPION HCL ER (XL) 300 MG PO TB24: ORAL_TABLET | ORAL | Status: DC

## 2012-06-02 NOTE — Telephone Encounter (Signed)
Request for Wellbutrin XL 300 mg 24 hr #90 last OV was 05/19/11. Last filled 06/09/11. Ok to refill?

## 2012-06-02 NOTE — Telephone Encounter (Signed)
Schedule appt please Will refill electronically

## 2012-06-06 ENCOUNTER — Other Ambulatory Visit: Payer: Self-pay | Admitting: *Deleted

## 2012-06-06 MED ORDER — BUPROPION HCL ER (XL) 300 MG PO TB24: ORAL_TABLET | ORAL | Status: DC

## 2012-06-07 ENCOUNTER — Other Ambulatory Visit: Payer: Self-pay

## 2012-06-07 MED ORDER — BUPROPION HCL ER (XL) 300 MG PO TB24: ORAL_TABLET | ORAL | Status: DC

## 2012-06-07 NOTE — Telephone Encounter (Signed)
Pt leaving for [place] for  1 week on 06/11/12 and will not get wellbutrin from [facility] before she leaves; pt request 30 day supply to [place] [address]. Pt advised refilled while on phone.

## 2012-07-25 ENCOUNTER — Encounter: Payer: Self-pay | Admitting: Family Medicine

## 2012-07-25 ENCOUNTER — Ambulatory Visit (INDEPENDENT_AMBULATORY_CARE_PROVIDER_SITE_OTHER): Payer: 59 | Admitting: Family Medicine

## 2012-07-25 VITALS — BP 134/84 | HR 86 | Temp 98.8°F | Ht 68.0 in | Wt 156.5 lb

## 2012-07-25 DIAGNOSIS — Z23 Encounter for immunization: Secondary | ICD-10-CM

## 2012-07-25 DIAGNOSIS — Z1231 Encounter for screening mammogram for malignant neoplasm of breast: Secondary | ICD-10-CM

## 2012-07-25 DIAGNOSIS — Z0000 Encounter for general adult medical examination without abnormal findings: Secondary | ICD-10-CM

## 2012-07-25 NOTE — Progress Notes (Signed)
Subjective:    Patient ID: Elizabeth Hayden, female    DOB: [birth year], 41 y.o.   MRN: [id number]  HPI Here for health maintenance exam and to review chronic medical problems    Lots of stress  Husband - infidelity issues, this is tough His daughter is pregnant at 10 Needs to get out of marriage  Also cares for son with down's syndrome   She is seeing a counselor  He has not changed - and is drinking more   Otherwise is feeling ok overall  bp is higher than usual today - is a bit upset   Had a flu shot today  Pap - is seeing a gyn- is due to go back for annual  Abn pap in 2012- just watching at this time  Is time for mammogram  Also tends to be very fibrocystic    Patient Active Problem List  Diagnosis  . EATING DISORDER  . DEPRESSION  . CARDIOMYOPATHY, HYPERTROPHIC, OBSTRUCTIVE  . ALLERGIC RHINITIS  . BICUSPID AORTIC VALVE  . CERV HIGH RISK HUMAN PAPILLOMAVIRUS DNA TEST POS  . Breast lump  . Gynecological examination  . Routine general medical examination at a health care facility  . Abnormal Pap smear and cervical HPV (human papillomavirus)  . Coarctation of aorta  . Aortic aneurysm   Past Medical History  Diagnosis Date  . Allergy   . Depression   . TMJ (dislocation of temporomandibular joint)     Wears a night gear   . Congenital heart disease     bicuspid aortic valve with coarctation repair 04/1974  . Aortic root dilation   . GERD (gastroesophageal reflux disease)   . Edema   . Fibrocystic breast   . HPV (human papilloma virus) infection    Past Surgical History  Procedure Date  . Coarctation of aorta excision 1975  . Eye surgery    History  Substance Use Topics  . Smoking status: Never Smoker   . Smokeless tobacco: Not on file  . Alcohol Use: Yes     occasional   Family History  Problem Relation Age of Onset  . Hypertension Father   . Diabetes Father   . Cancer Father     lung   . Breast cancer Maternal Grandmother   . Leukemia Maternal  Grandmother   . Ovarian cancer Paternal Grandmother    Allergies  Allergen Reactions  . Ace Inhibitors     REACTION: rash and cough   Current Outpatient Prescriptions on File Prior to Visit  Medication Sig Dispense Refill  . buPROPion (WELLBUTRIN XL) 300 MG 24 hr tablet Take one by mouth once daily  30 tablet  0  . CALCIUM-VITAMIN D PO Take 2 tablets by mouth daily.        . Multiple Vitamin (MULTIVITAMIN) capsule Take 1 capsule by mouth daily.        . ranitidine (ZANTAC) 150 MG tablet Take 150 mg by mouth as needed.           Review of Systems Review of Systems  Constitutional: Negative for fever, appetite change, fatigue and unexpected weight change.  Eyes: Negative for pain and visual disturbance.  Respiratory: Negative for cough and shortness of breath.   Cardiovascular: Negative for cp or palpitations    Gastrointestinal: Negative for nausea, diarrhea and constipation.  Genitourinary: Negative for urgency and frequency.  Skin: Negative for pallor or rash   Neurological: Negative for weakness, light-headedness, numbness and headaches.  Hematological: Negative for adenopathy.  Does not bruise/bleed easily.  Psychiatric/Behavioral: Negative for dysphoric mood. The patient is not nervous/anxious.  does admit to a lot of stress        Objective:   Physical Exam  Constitutional: She appears well-developed and well-nourished. No distress.  HENT:  Head: Normocephalic and atraumatic.  Right Ear: External ear normal.  Left Ear: External ear normal.  Nose: Nose normal.  Mouth/Throat: Oropharynx is clear and moist.       Non obst cerumen bilaterally  Eyes: Conjunctivae normal and EOM are normal. Pupils are equal, round, and reactive to light. No scleral icterus.  Neck: Normal range of motion. Neck supple. No JVD present. Carotid bruit is not present. No thyromegaly present.  Cardiovascular: Normal rate, regular rhythm, normal heart sounds and intact distal pulses.  Exam reveals  no gallop.   Pulmonary/Chest: Effort normal and breath sounds normal. No respiratory distress. She has no wheezes.  Abdominal: Soft. Bowel sounds are normal. She exhibits no distension, no abdominal bruit and no mass. There is no tenderness.  Musculoskeletal: She exhibits no edema and no tenderness.  Lymphadenopathy:    She has no cervical adenopathy.  Neurological: She is alert. She has normal reflexes. No cranial nerve deficit. She exhibits normal muscle tone. Coordination normal.  Skin: Skin is warm and dry. No rash noted. No erythema. No pallor.  Psychiatric: She has a normal mood and affect.          Assessment & Plan:

## 2012-07-25 NOTE — Assessment & Plan Note (Signed)
Ref for mammo She will continue self exams- has hx of fibrocystic breast dz  Enc to quit caffeine She will see her gyn for annual breast exam

## 2012-07-25 NOTE — Assessment & Plan Note (Signed)
Reviewed health habits including diet and exercise and skin cancer prevention Also reviewed health mt list, fam hx and immunizations  Pt will see gyn for her pap/ breast/ gyn exam (hx of abn pap in past) [facility]ness labs today to send to [facility] She will follow up if she needs an ear flush

## 2012-07-25 NOTE — Patient Instructions (Addendum)
You had your flu shot today Don't forget to see your gyn for your annual breast/ gyn/pap Labs today to send to [facility]  Keep going to counseling Try an otc night guard/ heat on your jaw for jaw clenching  Zantac is fine  Take care of yourself  We will refer you for mammogram at check out

## 2012-07-26 ENCOUNTER — Encounter: Payer: Self-pay | Admitting: *Deleted

## 2012-07-26 LAB — LIPID PANEL
Chol/HDL Ratio: 2.9 ratio units (ref 0.0–4.4)
HDL: 68 mg/dL (ref >39)
LDL Calculated: 115 mg/dL — ABNORMAL HIGH (ref 0–99)
VLDL Cholesterol Cal: 14 mg/dL (ref 5–40)

## 2012-07-26 LAB — COMPREHENSIVE METABOLIC PANEL
Albumin/Globulin Ratio: 2.1 (ref 1.1–2.5)
Albumin: 4.6 g/dL (ref 3.5–5.5)
Alkaline Phosphatase: 47 IU/L (ref 42–107)
BUN/Creatinine Ratio: 14 (ref 9–23)
BUN: 11 mg/dL (ref 6–24)
Chloride: 101 mmol/L (ref 97–108)
GFR calc Af Amer: 109 mL/min/{1.73_m2} (ref >59)
GFR calc non Af Amer: 95 mL/min/{1.73_m2} (ref >59)
Glucose: 89 mg/dL (ref 65–99)
Total Bilirubin: 0.6 mg/dL (ref 0.0–1.2)
Total Protein: 6.8 g/dL (ref 6.0–8.5)

## 2012-07-26 LAB — CBC WITH DIFFERENTIAL/PLATELET
Basophils Absolute: 0 10*3/uL (ref 0.0–0.2)
Basos: 0 % (ref 0–3)
Eosinophils Absolute: 0.1 10*3/uL (ref 0.0–0.4)
HCT: 41.4 % (ref 34.0–46.6)
Hemoglobin: 13.4 g/dL (ref 11.1–15.9)
Lymphocytes Absolute: 1.8 10*3/uL (ref 0.7–3.1)
Lymphs: 27 % (ref 14–46)
MCH: 29.8 pg (ref 26.6–33.0)
MCHC: 32.4 g/dL (ref 31.5–35.7)
Neutrophils Absolute: 3.9 10*3/uL (ref 1.4–7.0)
RBC: 4.49 x10E6/uL (ref 3.77–5.28)

## 2012-08-24 ENCOUNTER — Ambulatory Visit
Admission: RE | Admit: 2012-08-24 | Discharge: 2012-08-24 | Disposition: A | Discharge status: Home / Self Care | Payer: 59 | Source: Ambulatory Visit | Attending: Family Medicine | Admitting: Family Medicine

## 2012-08-24 DIAGNOSIS — Z1231 Encounter for screening mammogram for malignant neoplasm of breast: Secondary | ICD-10-CM

## 2012-08-25 ENCOUNTER — Other Ambulatory Visit: Payer: Self-pay | Admitting: Family Medicine

## 2012-08-25 ENCOUNTER — Telehealth: Payer: Self-pay

## 2012-08-25 DIAGNOSIS — N63 Unspecified lump in unspecified breast: Secondary | ICD-10-CM

## 2012-08-25 NOTE — Telephone Encounter (Signed)
Faxed order request from [facility] received and on Dr Royden Purl shelf.

## 2012-08-25 NOTE — Telephone Encounter (Signed)
Pt left v/m went for screening mammo 08/24/12; py told tech she had lumps in breast the breast center sent pt home. Pt needs bilateral diagnostic mammogram and [place] if needed ordered; fibrocystic breast can be dx. Pt wanted to make sure request from breast center received and more detailed study ordered. Please advise.

## 2012-08-26 NOTE — Telephone Encounter (Signed)
done

## 2012-08-30 ENCOUNTER — Other Ambulatory Visit: Payer: Self-pay | Admitting: *Deleted

## 2012-09-02 ENCOUNTER — Other Ambulatory Visit: Payer: 59

## 2012-09-07 ENCOUNTER — Ambulatory Visit
Admission: RE | Admit: 2012-09-07 | Discharge: 2012-09-07 | Disposition: A | Discharge status: Home / Self Care | Payer: 59 | Source: Ambulatory Visit | Attending: Family Medicine | Admitting: Family Medicine

## 2012-09-07 DIAGNOSIS — N63 Unspecified lump in unspecified breast: Secondary | ICD-10-CM

## 2012-09-21 ENCOUNTER — Ambulatory Visit (INDEPENDENT_AMBULATORY_CARE_PROVIDER_SITE_OTHER): Payer: 59 | Admitting: Psychology

## 2012-09-21 DIAGNOSIS — F4323 Adjustment disorder with mixed anxiety and depressed mood: Secondary | ICD-10-CM

## 2012-11-01 ENCOUNTER — Ambulatory Visit (INDEPENDENT_AMBULATORY_CARE_PROVIDER_SITE_OTHER): Payer: 59 | Admitting: Psychology

## 2012-11-01 DIAGNOSIS — F4323 Adjustment disorder with mixed anxiety and depressed mood: Secondary | ICD-10-CM

## 2012-11-22 ENCOUNTER — Ambulatory Visit (INDEPENDENT_AMBULATORY_CARE_PROVIDER_SITE_OTHER): Payer: 59 | Admitting: Psychology

## 2012-11-22 DIAGNOSIS — F4323 Adjustment disorder with mixed anxiety and depressed mood: Secondary | ICD-10-CM

## 2012-12-13 ENCOUNTER — Ambulatory Visit (INDEPENDENT_AMBULATORY_CARE_PROVIDER_SITE_OTHER): Payer: 59 | Admitting: Psychology

## 2012-12-13 DIAGNOSIS — F4323 Adjustment disorder with mixed anxiety and depressed mood: Secondary | ICD-10-CM

## 2013-01-03 ENCOUNTER — Ambulatory Visit (INDEPENDENT_AMBULATORY_CARE_PROVIDER_SITE_OTHER): Payer: 59 | Admitting: Psychology

## 2013-01-03 DIAGNOSIS — F4323 Adjustment disorder with mixed anxiety and depressed mood: Secondary | ICD-10-CM

## 2013-01-17 ENCOUNTER — Ambulatory Visit: Payer: 59 | Admitting: Psychology

## 2013-01-24 ENCOUNTER — Ambulatory Visit (INDEPENDENT_AMBULATORY_CARE_PROVIDER_SITE_OTHER): Payer: 59 | Admitting: Psychology

## 2013-01-24 DIAGNOSIS — F4323 Adjustment disorder with mixed anxiety and depressed mood: Secondary | ICD-10-CM

## 2013-02-08 ENCOUNTER — Ambulatory Visit (INDEPENDENT_AMBULATORY_CARE_PROVIDER_SITE_OTHER): Payer: 59 | Admitting: Psychology

## 2013-02-08 DIAGNOSIS — F4323 Adjustment disorder with mixed anxiety and depressed mood: Secondary | ICD-10-CM

## 2013-03-07 ENCOUNTER — Telehealth: Payer: Self-pay

## 2013-03-07 NOTE — Telephone Encounter (Signed)
Pt left v/m; pt is seeing Dr Laymond Purser and pt has questions about wellbutrin. Pt does not think wellbutrin is being effective. Left v/m requesting pt to call back.

## 2013-03-08 ENCOUNTER — Ambulatory Visit (INDEPENDENT_AMBULATORY_CARE_PROVIDER_SITE_OTHER): Payer: 59 | Admitting: Psychology

## 2013-03-08 DIAGNOSIS — F4323 Adjustment disorder with mixed anxiety and depressed mood: Secondary | ICD-10-CM

## 2013-03-08 MED ORDER — BUPROPION HCL ER (XL) 450 MG PO TB24: ORAL_TABLET | ORAL | Status: DC

## 2013-03-08 NOTE — Telephone Encounter (Signed)
Message left notifying patient and advised to return my call.

## 2013-03-08 NOTE — Telephone Encounter (Signed)
Pt called back and pt is feeling depressed on certain days, other days gets snappy with other people. Wellbutrin XL 300 mg one daily for 9 years. Pt also seeing Dr Laymond Purser. Pt said stress level is up and down. Pt request either changing dosage of Wellbutrin or substituting a different med. Pt does not feel that she would harm herself or anyone else.Please advise. [facility] [address] Pt said may speak with Dr Laymond Purser for her suggestions if needed. Pt has an appt 03/08/13 at 5:30 pm with Dr Laymond Purser. Pt request call back.(Can leave detailed message on cell phone if no answer).

## 2013-03-08 NOTE — Telephone Encounter (Signed)
It doesn't look like we've recently prescribed wellbutrin - but would suggest increase to max dose of 450mg  daily, trial of this for next 3-4 weeks, if side effects or not effective to come in for office visit to discuss switch in antidepressant with PCP.

## 2013-03-09 NOTE — Telephone Encounter (Signed)
Patient notified as instructed by telephone. Pt will pick up 450 mg and start med on 03/10/13. Pt will stop Wellbutrin XL 300 mg and will cb with any side effects or problems with med including if ineffective.

## 2013-04-04 ENCOUNTER — Ambulatory Visit (INDEPENDENT_AMBULATORY_CARE_PROVIDER_SITE_OTHER): Payer: 59 | Admitting: Psychology

## 2013-04-04 DIAGNOSIS — F4323 Adjustment disorder with mixed anxiety and depressed mood: Secondary | ICD-10-CM

## 2013-04-05 ENCOUNTER — Telehealth: Payer: Self-pay

## 2013-04-05 NOTE — Telephone Encounter (Signed)
Pt thinks wellbutrin 450 mg is effective; pt experiencing dry mouth and wants to know if that is a side effect of wellbutrin and if so what can pt do about dry mouth.Please advise.

## 2013-04-05 NOTE — Telephone Encounter (Signed)
Dry mouth is definitely side effect of wellbutrin. Recommend carry water bottle with her or may try OTC biotene products.  If not helping, may need change in dose.

## 2013-04-05 NOTE — Telephone Encounter (Signed)
Instructions given to pt, verbalized understanding.

## 2013-04-10 MED ORDER — BUPROPION HCL ER (XL) 450 MG PO TB24: 1.0000 | ORAL_TABLET | Freq: Every day | ORAL | Status: DC

## 2013-04-10 NOTE — Telephone Encounter (Signed)
Pt left v/m wants to know if needs appt to be seen prior to Well butrin being refilled; med seems to be working.Please advise.

## 2013-04-10 NOTE — Addendum Note (Signed)
Addended by: Shon Millet on: 04/10/2013 04:49 PM   Modules accepted: Orders

## 2013-04-10 NOTE — Telephone Encounter (Signed)
I really want her to stay on what she is on unless she cannot pay for it, thanks

## 2013-04-10 NOTE — Telephone Encounter (Addendum)
Pt will be due for CPE in Oct so pt scheduled CPE instead of f/u but when I went to refill med, system advise me that this Rx isn't covered by pt's insurance but system gave a list of Rx's alternatives that are covered, do you want me to refill med or change, please advise

## 2013-04-10 NOTE — Addendum Note (Signed)
Addended by: Shon Millet on: 04/10/2013 05:00 PM   Modules accepted: Orders

## 2013-04-10 NOTE — Telephone Encounter (Signed)
No- if she needs refills please go ahead and give her 3 refills and have her f/u in 3 mo thanks

## 2013-04-13 ENCOUNTER — Other Ambulatory Visit: Payer: Self-pay | Admitting: *Deleted

## 2013-04-13 MED ORDER — BUPROPION HCL ER (XL) 150 MG PO TB24: ORAL_TABLET | ORAL | Status: DC

## 2013-04-13 MED ORDER — BUPROPION HCL ER (XL) 300 MG PO TB24: ORAL_TABLET | ORAL | Status: DC

## 2013-04-13 NOTE — Telephone Encounter (Signed)
Received a fax from pharmacy that 450 mg of Wellbutrin xl is not a currently available strength.

## 2013-04-21 NOTE — Telephone Encounter (Signed)
Pt received Wellbutrin 150 mg and it is a different manufacturer than the 300 mg. Pt wants to know if it is OK to take 300 mg and 150 mg tabs if they have different manufacturers. Please advise.

## 2013-04-21 NOTE — Telephone Encounter (Signed)
That is fine - different generics often come from different manufacturers

## 2013-04-21 NOTE — Telephone Encounter (Signed)
Pt.notified

## 2013-05-02 ENCOUNTER — Ambulatory Visit (INDEPENDENT_AMBULATORY_CARE_PROVIDER_SITE_OTHER): Payer: 59 | Admitting: Psychology

## 2013-05-02 DIAGNOSIS — F4323 Adjustment disorder with mixed anxiety and depressed mood: Secondary | ICD-10-CM

## 2013-05-23 ENCOUNTER — Ambulatory Visit (INDEPENDENT_AMBULATORY_CARE_PROVIDER_SITE_OTHER): Payer: 59 | Admitting: Psychology

## 2013-05-23 DIAGNOSIS — F4323 Adjustment disorder with mixed anxiety and depressed mood: Secondary | ICD-10-CM

## 2013-06-13 ENCOUNTER — Ambulatory Visit (INDEPENDENT_AMBULATORY_CARE_PROVIDER_SITE_OTHER): Payer: 59 | Admitting: Psychology

## 2013-06-13 DIAGNOSIS — F4323 Adjustment disorder with mixed anxiety and depressed mood: Secondary | ICD-10-CM

## 2013-07-11 ENCOUNTER — Ambulatory Visit (INDEPENDENT_AMBULATORY_CARE_PROVIDER_SITE_OTHER): Payer: 59 | Admitting: Psychology

## 2013-07-11 DIAGNOSIS — F4323 Adjustment disorder with mixed anxiety and depressed mood: Secondary | ICD-10-CM

## 2013-08-01 ENCOUNTER — Encounter: Payer: Self-pay | Admitting: Family Medicine

## 2013-08-01 ENCOUNTER — Ambulatory Visit (INDEPENDENT_AMBULATORY_CARE_PROVIDER_SITE_OTHER): Payer: 59 | Admitting: Family Medicine

## 2013-08-01 VITALS — BP 128/82 | HR 76 | Temp 98.2°F | Ht 68.0 in | Wt 145.0 lb

## 2013-08-01 DIAGNOSIS — F329 Major depressive disorder, single episode, unspecified: Secondary | ICD-10-CM

## 2013-08-01 DIAGNOSIS — Z0000 Encounter for general adult medical examination without abnormal findings: Secondary | ICD-10-CM

## 2013-08-01 DIAGNOSIS — Z23 Encounter for immunization: Secondary | ICD-10-CM

## 2013-08-01 DIAGNOSIS — R87810 Cervical high risk human papillomavirus (HPV) DNA test positive: Secondary | ICD-10-CM

## 2013-08-01 MED ORDER — BUPROPION HCL ER (XL) 450 MG PO TB24: 1.0000 | ORAL_TABLET | Freq: Every day | ORAL | Status: DC

## 2013-08-01 NOTE — Progress Notes (Signed)
Subjective:    Patient ID: Elizabeth Hayden, female    DOB: January 25, 1971, 42 y.o.   MRN: [id number]  HPI Here for health maintenance exam and to review chronic medical problems    Is trying to sell a house and get divorced - husb cheated on her - is doing ok with it  Has a great support system    Wt is down 11 lb with bmi of 22 Stress level is up  Diet- eats well overall  -- she thinks she is eating enough  Exercise- is much more active - more time to take care of herself   Flu vaccine - got that today   Td 7/05  Mammogram 11/13 -found a cyst  She will schedule her own   Pap at [facility] - was about 6 months ago  (clear pap smear) Now back to yearly pap  [facility]- assessment was overall inconclusive- but classified as low risk  Hx of hpv Is getting checked for STDs- have been neg   Needs lab today  Mood/ depression- overall is doing fairly well  Increasing the wellbutrin has helped despite stressors - work and home both On 450 (forfivo -- buproprion)   Patient Active Problem List   Diagnosis Date Noted  . Other screening mammogram 07/25/2012  . Coarctation of aorta 11/24/2011  . Aortic aneurysm 11/24/2011  . Abnormal Pap smear and cervical HPV (human papillomavirus) 05/31/2011  . Gynecological examination 05/19/2011  . Routine general medical examination at a health care facility 05/19/2011  . Breast lump 03/04/2011  . CERV HIGH RISK HUMAN PAPILLOMAVIRUS DNA TEST POS 11/14/2009  . EATING DISORDER 11/02/2007  . DEPRESSION 11/02/2007  . CARDIOMYOPATHY, HYPERTROPHIC, OBSTRUCTIVE 11/02/2007  . ALLERGIC RHINITIS 11/02/2007  . BICUSPID AORTIC VALVE 11/02/2007   Past Medical History  Diagnosis Date  . Allergy   . Depression   . TMJ (dislocation of temporomandibular joint)     Wears a night gear   . Congenital heart disease     bicuspid aortic valve with coarctation repair 04/1974  . Aortic root dilation   . GERD (gastroesophageal reflux disease)   . Edema   .  Fibrocystic breast   . HPV (human papilloma virus) infection    Past Surgical History  Procedure Laterality Date  . Coarctation of aorta excision  1975  . Eye surgery     History  Substance Use Topics  . Smoking status: Never Smoker   . Smokeless tobacco: Not on file  . Alcohol Use: Yes     Comment: occasional   Family History  Problem Relation Age of Onset  . Hypertension Father   . Diabetes Father   . Cancer Father     lung   . Breast cancer Maternal Grandmother   . Leukemia Maternal Grandmother   . Ovarian cancer Paternal Grandmother    Allergies  Allergen Reactions  . Ace Inhibitors     REACTION: rash and cough   Current Outpatient Prescriptions on File Prior to Visit  Medication Sig Dispense Refill  . CALCIUM-VITAMIN D PO Take 2 tablets by mouth daily.        . Multiple Vitamin (MULTIVITAMIN) capsule Take 1 capsule by mouth daily.        . ranitidine (ZANTAC) 150 MG tablet Take 150 mg by mouth as needed.        No current facility-administered medications on file prior to visit.       Review of Systems Review of Systems  Constitutional:  Negative for fever, appetite change, fatigue and unexpected weight change.  Eyes: Negative for pain and visual disturbance.  Respiratory: Negative for cough and shortness of breath.   Cardiovascular: Negative for cp or palpitations    Gastrointestinal: Negative for nausea, diarrhea and constipation.  Genitourinary: Negative for urgency and frequency.  Skin: Negative for pallor or rash   Neurological: Negative for weakness, light-headedness, numbness and headaches.  Hematological: Negative for adenopathy. Does not bruise/bleed easily.  Psychiatric/Behavioral: Negative for dysphoric mood. The patient is not nervous/anxious.         Objective:   Physical Exam  Constitutional: She appears well-developed and well-nourished. No distress.  Slim female  HENT:  Head: Normocephalic and atraumatic.  Right Ear: External ear  normal.  Left Ear: External ear normal.  Mouth/Throat: Oropharynx is clear and moist.  Eyes: Conjunctivae and EOM are normal. Pupils are equal, round, and reactive to light. No scleral icterus.  Neck: Normal range of motion. Neck supple. No JVD present. Carotid bruit is not present. No thyromegaly present.  Cardiovascular: Normal rate, regular rhythm and intact distal pulses.  Exam reveals no gallop.   Murmur heard. Pulmonary/Chest: Effort normal and breath sounds normal. No respiratory distress. She has no wheezes. She exhibits no tenderness.  Abdominal: Soft. Bowel sounds are normal. She exhibits no distension, no abdominal bruit and no mass. There is no tenderness.  Genitourinary: No breast swelling, tenderness, discharge or bleeding.  Musculoskeletal: Normal range of motion. She exhibits no edema and no tenderness.  Lymphadenopathy:    She has no cervical adenopathy.  Neurological: She is alert. She has normal reflexes. No cranial nerve deficit. She exhibits normal muscle tone. Coordination normal.  Skin: Skin is warm and dry. No rash noted. No erythema. No pallor.  lentigos diffusely  Psychiatric: She has a normal mood and affect.          Assessment & Plan:

## 2013-08-01 NOTE — Patient Instructions (Signed)
Labs today Keep taking care of yourself Make sure you are getting enough calories - do not loose more weight  Flu shot today

## 2013-08-02 ENCOUNTER — Encounter: Payer: Self-pay | Admitting: *Deleted

## 2013-08-02 LAB — CBC WITH DIFFERENTIAL/PLATELET
Basophils Absolute: 0 10*3/uL (ref 0.0–0.2)
Eos: 3 %
HCT: 41.3 % (ref 34.0–46.6)
Immature Grans (Abs): 0 10*3/uL (ref 0.0–0.1)
Immature Granulocytes: 0 %
Lymphs: 28 %
MCHC: 32.4 g/dL (ref 31.5–35.7)
MCV: 93 fL (ref 79–97)
Monocytes: 12 %
RBC: 4.45 x10E6/uL (ref 3.77–5.28)
RDW: 13.2 % (ref 12.3–15.4)
WBC: 7.1 10*3/uL (ref 3.4–10.8)

## 2013-08-02 LAB — COMPREHENSIVE METABOLIC PANEL
Albumin/Globulin Ratio: 1.9 (ref 1.1–2.5)
Albumin: 4.4 g/dL (ref 3.5–5.5)
Alkaline Phosphatase: 46 IU/L (ref 39–117)
BUN: 10 mg/dL (ref 6–24)
CO2: 26 mmol/L (ref 18–29)
Calcium: 9.3 mg/dL (ref 8.7–10.2)
Chloride: 100 mmol/L (ref 97–108)
Glucose: 76 mg/dL (ref 65–99)
Potassium: 4.3 mmol/L (ref 3.5–5.2)
Total Protein: 6.7 g/dL (ref 6.0–8.5)

## 2013-08-02 LAB — TSH: TSH: 1.87 u[IU]/mL (ref 0.450–4.500)

## 2013-08-02 LAB — LIPID PANEL
Chol/HDL Ratio: 2.7 ratio units (ref 0.0–4.4)
Cholesterol, Total: 182 mg/dL (ref 100–199)
LDL Calculated: 105 mg/dL — ABNORMAL HIGH (ref 0–99)
Triglycerides: 48 mg/dL (ref 0–149)
VLDL Cholesterol Cal: 10 mg/dL (ref 5–40)

## 2013-08-02 NOTE — Assessment & Plan Note (Signed)
Refilled high dose buproprion which works well for her  Overall with good support - getting through divorce after infidelity on husb part  Disc coping skills

## 2013-08-02 NOTE — Assessment & Plan Note (Signed)
Reviewed health habits including diet and exercise and skin cancer prevention Also reviewed health mt list, fam hx and immunizations  Overall doing well - but need to watch for recurrence of disordered eating given 11 lb wt loss- she is exercising more, disc imp of keeping calories up and she voiced understanding  Wellness labs today

## 2013-08-02 NOTE — Assessment & Plan Note (Signed)
Per pt - last pap clear and continues to see gyn Has also had STD screening

## 2013-08-08 ENCOUNTER — Ambulatory Visit: Payer: Self-pay | Admitting: Psychology

## 2014-01-18 ENCOUNTER — Ambulatory Visit
Admission: RE | Admit: 2014-01-18 | Discharge: 2014-01-18 | Disposition: A | Discharge status: Home / Self Care | Payer: 59 | Source: Ambulatory Visit

## 2014-01-18 ENCOUNTER — Other Ambulatory Visit: Payer: Self-pay

## 2014-01-18 DIAGNOSIS — Z1231 Encounter for screening mammogram for malignant neoplasm of breast: Secondary | ICD-10-CM

## 2014-01-22 ENCOUNTER — Encounter: Payer: Self-pay | Admitting: *Deleted

## 2014-06-01 ENCOUNTER — Encounter: Payer: Self-pay | Admitting: Family Medicine

## 2014-06-01 ENCOUNTER — Ambulatory Visit (INDEPENDENT_AMBULATORY_CARE_PROVIDER_SITE_OTHER)
Admission: RE | Admit: 2014-06-01 | Discharge: 2014-06-01 | Disposition: A | Discharge status: Home / Self Care | Payer: 59 | Source: Ambulatory Visit | Attending: Family Medicine | Admitting: Family Medicine

## 2014-06-01 ENCOUNTER — Ambulatory Visit (INDEPENDENT_AMBULATORY_CARE_PROVIDER_SITE_OTHER): Payer: 59 | Admitting: Family Medicine

## 2014-06-01 VITALS — BP 146/96 | HR 83 | Temp 98.5°F | Ht 68.0 in | Wt 144.5 lb

## 2014-06-01 DIAGNOSIS — M542 Cervicalgia: Secondary | ICD-10-CM

## 2014-06-01 MED ORDER — MELOXICAM 15 MG PO TABS: 15.0000 mg | ORAL_TABLET | Freq: Every day | ORAL | Status: DC

## 2014-06-01 MED ORDER — CYCLOBENZAPRINE HCL 10 MG PO TABS: 10.0000 mg | ORAL_TABLET | Freq: Three times a day (TID) | ORAL | Status: DC | PRN

## 2014-06-01 NOTE — Progress Notes (Signed)
Pre visit review using our clinic review tool, if applicable. No additional management support is needed unless otherwise documented below in the visit note. 

## 2014-06-01 NOTE — Progress Notes (Signed)
Subjective:    Patient ID: Elizabeth Hayden, female    DOB: 10/11/1971, 43 y.o.   MRN: [id number]  HPI Here with neck pain after pulling a muscle   Has never had it this bad before   Started Monday - L  side - dull pain / irritating  Tuesday- significantly worse- massaged it all day - took ibuprofen and put heat on it  3am wed- severe- used ice pack to sleep- that persisted through the day (hurt to turn head to the R)  Thurs afternoon -worse /nothing helped Some pain rad down L arm with some tingling   Limited rom , and rotating head either way is bad  Even moving R arm will shoot pain up   Patient Active Problem List   Diagnosis Date Noted  . Other screening mammogram 07/25/2012  . Coarctation of aorta 11/24/2011  . Aortic aneurysm 11/24/2011  . Abnormal Pap smear and cervical HPV (human papillomavirus) 05/31/2011  . Gynecological examination 05/19/2011  . Routine general medical examination at a health care facility 05/19/2011  . CERV HIGH RISK HUMAN PAPILLOMAVIRUS DNA TEST POS 11/14/2009  . History of eating disorder 11/02/2007  . DEPRESSION 11/02/2007  . CARDIOMYOPATHY, HYPERTROPHIC, OBSTRUCTIVE 11/02/2007  . ALLERGIC RHINITIS 11/02/2007  . BICUSPID AORTIC VALVE 11/02/2007   Past Medical History  Diagnosis Date  . Allergy   . Depression   . TMJ (dislocation of temporomandibular joint)     Wears a night gear   . Congenital heart disease     bicuspid aortic valve with coarctation repair 04/1974  . Aortic root dilation   . GERD (gastroesophageal reflux disease)   . Edema   . Fibrocystic breast   . HPV (human papilloma virus) infection    Past Surgical History  Procedure Laterality Date  . Coarctation of aorta excision  1975  . Eye surgery     History  Substance Use Topics  . Smoking status: Never Smoker   . Smokeless tobacco: Not on file  . Alcohol Use: Yes     Comment: occasional   Family History  Problem Relation Age of Onset  . Hypertension Father   .  Diabetes Father   . Cancer Father     lung   . Breast cancer Maternal Grandmother   . Leukemia Maternal Grandmother   . Ovarian cancer Paternal Grandmother    Allergies  Allergen Reactions  . Ace Inhibitors     REACTION: rash and cough   Current Outpatient Prescriptions on File Prior to Visit  Medication Sig Dispense Refill  . BuPROPion HCl ER, XL, 450 MG TB24 Take 1 tablet by mouth daily.  90 tablet  3  . CALCIUM-VITAMIN D PO Take 2 tablets by mouth daily.        . Multiple Vitamin (MULTIVITAMIN) capsule Take 1 capsule by mouth daily.        . ranitidine (ZANTAC) 150 MG tablet Take 150 mg by mouth as needed.        No current facility-administered medications on file prior to visit.      Review of Systems Review of Systems  Constitutional: Negative for fever, appetite change, fatigue and unexpected weight change.  Eyes: Negative for pain and visual disturbance.  Respiratory: Negative for cough and shortness of breath.   Cardiovascular: Negative for cp or palpitations    Gastrointestinal: Negative for nausea, diarrhea and constipation.  Genitourinary: Negative for urgency and frequency.  Skin: Negative for pallor or rash   MSK pos  for neck pain that radiates to the arm  Neurological: Negative for weakness, light-headedness, numbness and headaches.  Hematological: Negative for adenopathy. Does not bruise/bleed easily.  Psychiatric/Behavioral: Negative for dysphoric mood. The patient is not nervous/anxious.         Objective:   Physical Exam  Constitutional: She appears well-developed and well-nourished. No distress.  HENT:  Head: Normocephalic and atraumatic.  Mouth/Throat: Oropharynx is clear and moist.  Eyes: Conjunctivae and EOM are normal. Pupils are equal, round, and reactive to light. No scleral icterus.  Neck: Neck supple. No JVD present. Muscular tenderness present. No spinous process tenderness present. No rigidity. Decreased range of motion present. No edema and  no erythema present. No thyromegaly present.  L para cervical and trapezius tenderness  No bony tenderness  Nl neuro exam of LUE   Cardiovascular: Normal rate and regular rhythm.   Pulmonary/Chest: Effort normal and breath sounds normal.  Lymphadenopathy:    She has no cervical adenopathy.  Neurological: She is alert. She has normal reflexes. No cranial nerve deficit or sensory deficit. She exhibits normal muscle tone. Coordination and gait normal.  Skin: Skin is warm and dry. No rash noted. No erythema. No pallor.  Psychiatric: She has a normal mood and affect.          Assessment & Plan:   Problem List Items Addressed This Visit     Other   Neck pain on left side - Primary     Cervical and trapezius spasm with rad to arm No neuro change on exam Xray cs  Flexeril as tolerated Heat  meloxicam daily Consider PT if no imp    Relevant Orders      DG Cervical Spine Complete (Completed)

## 2014-06-01 NOTE — Patient Instructions (Signed)
Xray today Use heat on neck  Think about a cervical support pillow (foam)  meloxicam daily with food (do not mix with otc meds)  Flexeril up to three times daily-watch for sedation  We may consider physical therapy

## 2014-06-01 NOTE — Assessment & Plan Note (Signed)
Cervical and trapezius spasm with rad to arm No neuro change on exam Xray cs  Flexeril as tolerated Heat  meloxicam daily Consider PT if no imp

## 2014-07-05 ENCOUNTER — Encounter: Payer: Self-pay | Admitting: Cardiovascular Disease

## 2014-07-05 ENCOUNTER — Ambulatory Visit (INDEPENDENT_AMBULATORY_CARE_PROVIDER_SITE_OTHER): Payer: 59 | Admitting: Cardiovascular Disease

## 2014-07-05 VITALS — BP 124/96 | HR 90 | Ht 68.0 in | Wt 145.0 lb

## 2014-07-05 DIAGNOSIS — I77810 Thoracic aortic ectasia: Secondary | ICD-10-CM

## 2014-07-05 DIAGNOSIS — Q231 Congenital insufficiency of aortic valve: Secondary | ICD-10-CM

## 2014-07-05 DIAGNOSIS — J309 Allergic rhinitis, unspecified: Secondary | ICD-10-CM

## 2014-07-05 NOTE — Assessment & Plan Note (Signed)
F/U echo to assess morphology and AS/AR  Suspect both mild

## 2014-07-05 NOTE — Patient Instructions (Signed)
Your physician wants you to follow-up in:  YEAR WITH  DR Haywood Filler will receive a reminder letter in the mail two months in advance. If you don't receive a letter, please call our office to schedule the follow-up appointment.  Your physician recommends that you continue on your current medications as directed. Please refer to the Current Medication list given to you today. Your physician has requested that you have an echocardiogram. Echocardiography is a painless test that uses sound waves to create images of your heart. It provides your doctor with information about the size and shape of your heart and how well your heart's chambers and valves are working. This procedure takes approximately one hour. There are no restrictions for this procedure. Your physician has requested that you have a cardiac MRI. Cardiac MRI uses a computer to create images of your heart as its beating, producing both still and moving pictures of your heart and major blood vessels. For further information please visit [email or web address]. Please follow the instruction sheet given to you today for more information. AND  MRA

## 2014-07-05 NOTE — Assessment & Plan Note (Signed)
In setting of bicuspid AV  4.5 cm  F/U Aortic MRA

## 2014-07-05 NOTE — Progress Notes (Signed)
Patient ID: Elizabeth Hayden, female   DOB: 03/08/1971, 43 y.o.   MRN: [id number] Parys returns today for followup. She has a history of bicuspid  aortic valve with coarctation repair.  She has been doing well, she has not had any significant PND, orthopnea,  chest pain or palpitations. She has had a long standing history of  chronic edema, this tends to be generalizes body edema both in her legs,  and her hands and in her face. No longer on lasix for edema She is  being seen by Dr. Milinda Antis, this is clearly not related to her heart, she  says her thyroid has been fine.  Some stress at home. Getting a divorce. Has 81 yo downs child and 81 yo  Daughter looking at colleges. 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 Wall thickness was normal. Systolic function was normal. The estimated ejection fraction was in the range of 60% to 65%. - Aorta: The aorta was moderately dilated.  Indicates coarctation but no mean gradient given.  Had patient reimaged today with my supervision  Peak velocity: 1.35  Mean Gradient  Peak Gradient:    Doing better finally divorced working in different department at [employer]  Daughter at [employer]  ROS: Denies fever, malais, weight loss, blurry vision, decreased visual acuity, cough, sputum, SOB, hemoptysis, pleuritic pain, palpitaitons, heartburn, abdominal pain, melena, lower extremity edema, claudication, or rash.  All other systems reviewed and  negative  General: Affect appropriate Healthy:  appears stated age HEENT: normal Neck supple with no adenopathy JVP normal no bruits no thyromegaly  No coarctation murmur in back  Lungs clear with no wheezing and good diaphragmatic motion Heart:  S1/S2 SEM no AR  murmur, no rub, gallop or click PMI normal Abdomen: benighn, BS positve, no tenderness, no AAA no bruit.  No HSM or HJR Distal pulses intact with no bruits No edema Neuro non-focal Skin warm and dry No muscular weakness   Current Outpatient Prescriptions  Medication Sig Dispense Refill  . BuPROPion HCl ER, XL, 450 MG TB24 Take 1 tablet by mouth daily.  90 tablet  3  . CALCIUM-VITAMIN D PO Take 2 tablets by mouth daily.        . Multiple Vitamin (MULTIVITAMIN) capsule Take 1 capsule by mouth daily.        . ranitidine (ZANTAC) 150 MG tablet Take 150 mg by mouth as needed.        No current facility-administered medications for this visit.    Allergies  Ace inhibitors  Electrocardiogram:  SR rate 90 LAFB   Assessment and Plan

## 2014-07-05 NOTE — Assessment & Plan Note (Signed)
Previous evidence of re coarctation.  Needs f/u cardiac MRI and aortic MRA to size ascending aortic root 4.5 cm previously and measure residual coarctation lumen  No LE symptoms

## 2014-07-09 ENCOUNTER — Encounter: Payer: Self-pay | Admitting: Cardiovascular Disease

## 2014-07-13 ENCOUNTER — Telehealth: Payer: Self-pay | Admitting: Cardiovascular Disease

## 2014-07-13 NOTE — Telephone Encounter (Signed)
Order for a Bmet faxed to patient at her place of employment for upcoming cardiac MRI, MRA of aorta. Results to be faxed to Dr. Eden EmmsNishan.

## 2014-07-13 NOTE — Telephone Encounter (Signed)
New problem   Pt called and needs   Order  Fax # [phone] for lad faxed to her.   She works at [employer]. Has to do it there.

## 2014-07-19 ENCOUNTER — Ambulatory Visit (HOSPITAL_COMMUNITY): Payer: 59 | Attending: Cardiology | Admitting: Radiology

## 2014-07-19 ENCOUNTER — Other Ambulatory Visit: Payer: Self-pay | Admitting: Cardiovascular Disease

## 2014-07-19 ENCOUNTER — Encounter: Payer: Self-pay | Admitting: Cardiovascular Disease

## 2014-07-19 DIAGNOSIS — I77810 Thoracic aortic ectasia: Secondary | ICD-10-CM | POA: Diagnosis not present

## 2014-07-19 DIAGNOSIS — Q231 Congenital insufficiency of aortic valve: Secondary | ICD-10-CM

## 2014-07-19 NOTE — Progress Notes (Signed)
Echocardiogram performed.  

## 2014-07-20 LAB — BASIC METABOLIC PANEL
BUN / CREAT RATIO: 14 (ref 9–23)
BUN: 12 mg/dL (ref 6–24)
CHLORIDE: 101 mmol/L (ref 97–108)
CO2: 24 mmol/L (ref 18–29)
Calcium: 9.4 mg/dL (ref 8.7–10.2)
Creatinine, Ser: 0.86 mg/dL (ref 0.57–1.00)
GFR calc non Af Amer: 82 mL/min/{1.73_m2} (ref >59)
GFR, EST AFRICAN AMERICAN: 94 mL/min/{1.73_m2} (ref >59)
Glucose: 89 mg/dL (ref 65–99)
Potassium: 4.1 mmol/L (ref 3.5–5.2)
SODIUM: 141 mmol/L (ref 134–144)

## 2014-07-23 ENCOUNTER — Ambulatory Visit (HOSPITAL_COMMUNITY): Admission: RE | Admit: 2014-07-23 | Payer: 59 | Source: Ambulatory Visit

## 2014-07-23 ENCOUNTER — Other Ambulatory Visit: Payer: Self-pay | Admitting: Family Medicine

## 2014-07-23 ENCOUNTER — Inpatient Hospital Stay (HOSPITAL_COMMUNITY): Admission: RE | Admit: 2014-07-23 | Payer: 59 | Source: Ambulatory Visit

## 2014-07-23 ENCOUNTER — Other Ambulatory Visit: Payer: Self-pay | Admitting: Cardiovascular Disease

## 2014-07-23 ENCOUNTER — Ambulatory Visit (HOSPITAL_COMMUNITY)
Admission: RE | Admit: 2014-07-23 | Discharge: 2014-07-23 | Disposition: A | Discharge status: Home / Self Care | Payer: 59 | Source: Ambulatory Visit | Attending: Cardiovascular Disease | Admitting: Cardiovascular Disease

## 2014-07-23 DIAGNOSIS — Q231 Congenital insufficiency of aortic valve: Secondary | ICD-10-CM | POA: Insufficient documentation

## 2014-07-23 DIAGNOSIS — I77810 Thoracic aortic ectasia: Secondary | ICD-10-CM | POA: Diagnosis not present

## 2014-07-23 DIAGNOSIS — I719 Aortic aneurysm of unspecified site, without rupture: Secondary | ICD-10-CM

## 2014-07-23 MED ORDER — GADOBENATE DIMEGLUMINE 529 MG/ML IV SOLN
24.0000 mL | Freq: Once | INTRAVENOUS | Status: AC | PRN
  Administered 2014-07-23: 24 mL via INTRAVENOUS

## 2014-07-24 ENCOUNTER — Telehealth: Payer: Self-pay | Admitting: Cardiovascular Disease

## 2014-07-24 ENCOUNTER — Other Ambulatory Visit: Payer: Self-pay | Admitting: *Deleted

## 2014-07-24 DIAGNOSIS — Q2381 Bicuspid aortic valve: Secondary | ICD-10-CM

## 2014-07-24 DIAGNOSIS — I712 Thoracic aortic aneurysm, without rupture: Secondary | ICD-10-CM

## 2014-07-24 DIAGNOSIS — Q231 Congenital insufficiency of aortic valve: Secondary | ICD-10-CM

## 2014-07-24 DIAGNOSIS — I7121 Aneurysm of the ascending aorta, without rupture: Secondary | ICD-10-CM

## 2014-07-24 NOTE — Telephone Encounter (Signed)
LMTCB ./CY 

## 2014-07-24 NOTE — Telephone Encounter (Signed)
PT  AWARE OF  APPT  FOR  Friday WITH DR Eden EmmsNISHAN  TO DISCUSS MRI MRA  RESULTS  ANEURYSM HAS  INCREASED IN SIZE  .Zack Seal/CY

## 2014-07-24 NOTE — Telephone Encounter (Signed)
New message ° ° ° ° °Pt is returning Christine's call °

## 2014-07-27 ENCOUNTER — Encounter: Payer: Self-pay | Admitting: *Deleted

## 2014-07-27 ENCOUNTER — Ambulatory Visit (INDEPENDENT_AMBULATORY_CARE_PROVIDER_SITE_OTHER): Payer: 59 | Admitting: Cardiovascular Disease

## 2014-07-27 VITALS — BP 133/75 | HR 88 | Ht 68.0 in | Wt 148.8 lb

## 2014-07-27 DIAGNOSIS — Q251 Coarctation of aorta: Secondary | ICD-10-CM

## 2014-07-27 DIAGNOSIS — Q231 Congenital insufficiency of aortic valve: Secondary | ICD-10-CM

## 2014-07-27 DIAGNOSIS — I719 Aortic aneurysm of unspecified site, without rupture: Secondary | ICD-10-CM

## 2014-07-27 DIAGNOSIS — Z01818 Encounter for other preprocedural examination: Secondary | ICD-10-CM

## 2014-07-27 NOTE — Patient Instructions (Addendum)
Your physician has requested that you have a cardiac catheterization. Cardiac catheterization is used to diagnose and/or treat various heart conditions. Doctors may recommend this procedure for a number of different reasons. The most common reason is to evaluate chest pain. Chest pain can be a symptom of coronary artery disease (CAD), and cardiac catheterization can show whether plaque is narrowing or blocking your heart's arteries. This procedure is also used to evaluate the valves, as well as measure the blood flow and oxygen levels in different parts of your heart. For further information please visit [email or web address]. Please follow instruction sheet, as given.   Your physician has requested that you have a TEE. During a TEE, sound waves are used to create images of your heart. It provides your doctor with information about the size and shape of your heart and how well your heart's chambers and valves are working. In this test, a transducer is attached to the end of a flexible tube that's guided down your throat and into your esophagus (the tube leading from you mouth to your stomach) to get a more detailed image of your heart. You are not awake for the procedure. Please see the instruction sheet given to you today. For further information please visit [email or web address].   Your physician recommends that you continue on your current medications as directed. Please refer to the Current Medication list given to you today. Your physician recommends that you return for lab work in:   PT  TO HAVE DO NE AT [facility]    BMET  CBC INR

## 2014-07-27 NOTE — Progress Notes (Signed)
Patient ID: Elizabeth Hayden, female   DOB: 03/08/1971, 43 y.o.   MRN: [id number] [name] returns today for followup. She has a history of bicuspid aortic valve with coarctation repair. She has been doing well, she has not had any significant PND, orthopnea, chest pain or palpitations. She has had a long standing history of  chronic edema, this tends to be generalizes body edema both in her legs, and her hands and in her face. No longer on lasix for edema She is being seen by Dr. 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 Wall thickness was normal. Systolic function was normal. The estimated ejection fraction was in the range of 60% to 65%. - Aorta: The aorta was moderately dilated.  Indicates coarctation but no mean gradient given.  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  Do not think that she needs concomitant distal aortic bypass for coarctation.  Given her young age I would think that despite bicuspid valve a "valve sparing " operation  Would be done as she has no significant AS/AR.  Will need to arrange TEE and left heart cath prior to any considered surgery to r/o CAD and assess morphology of aortic valve better.      ROS: Denies fever, malais, weight loss, blurry vision, decreased visual acuity, cough, sputum, SOB, hemoptysis, pleuritic pain, palpitaitons, heartburn, abdominal pain, melena, lower extremity edema, claudication, or rash.  All other systems reviewed and negative  General: Affect appropriate Healthy:  appears stated age HEENT: normal Neck supple with no adenopathy JVP normal no bruits no thyromegaly Lungs clear with no wheezing and good diaphragmatic motion Heart:  S1/S2 soft SEM no AR  Murmur no coarctation murmur , no rub, gallop or click PMI normal  Previous left lateral thoracotomy scar from coarctation repair  Abdomen: benighn, BS positve, no tenderness, no AAA no bruit.  No HSM or HJR Distal pulses intact with no bruits No edema Neuro non-focal Skin warm and dry No muscular weakness   Current Outpatient Prescriptions  Medication Sig Dispense Refill  . CALCIUM-VITAMIN D PO Take 2 tablets by mouth daily.        . FORFIVO XL 450 MG  TB24 Take 1 tablet by mouth  daily  90 tablet  1  . Multiple Vitamin (MULTIVITAMIN) capsule Take 1 capsule by mouth daily.        . ranitidine (ZANTAC) 150 MG tablet Take 150 mg by mouth as needed.        No current facility-administered medications for this visit.    Allergies  Ace inhibitors  Electrocardiogram:  SR rate 90 LAFB   Assessment and Plan

## 2014-07-29 ENCOUNTER — Encounter: Payer: Self-pay | Admitting: Cardiovascular Disease

## 2014-07-29 NOTE — Assessment & Plan Note (Signed)
No significant AS/AR  TEE to check morphology to see if suitable for valve sparing surgery of aortic root

## 2014-07-29 NOTE — Assessment & Plan Note (Signed)
Increase in size measured orthogonally from 4.5 to 5.0 cm in setting of bicuspid AV aortopathy recommend surgical repair.  Scheduled TEE/Cath ad f/u with Dr Laneta SimmersBartle

## 2014-07-29 NOTE — Assessment & Plan Note (Signed)
MLD 1.5 cm stable no murmur Will do pull back gradient at cath but don't think distal aortic bypass will be needed when aneurysm repaired

## 2014-07-31 ENCOUNTER — Encounter (HOSPITAL_COMMUNITY): Payer: Self-pay | Admitting: Pharmacy Technician

## 2014-08-05 ENCOUNTER — Other Ambulatory Visit: Payer: Self-pay | Admitting: Cardiovascular Disease

## 2014-08-06 ENCOUNTER — Encounter (HOSPITAL_COMMUNITY)
Admission: RE | Disposition: A | Discharge status: Home / Self Care | Payer: Self-pay | Source: Ambulatory Visit | Attending: Cardiovascular Disease

## 2014-08-06 ENCOUNTER — Ambulatory Visit (HOSPITAL_COMMUNITY)
Admission: RE | Admit: 2014-08-06 | Discharge: 2014-08-06 | Disposition: A | Discharge status: Home / Self Care | Payer: 59 | Source: Ambulatory Visit | Attending: Cardiovascular Disease | Admitting: Cardiovascular Disease

## 2014-08-06 ENCOUNTER — Encounter (HOSPITAL_COMMUNITY): Payer: Self-pay

## 2014-08-06 DIAGNOSIS — Z01810 Encounter for preprocedural cardiovascular examination: Secondary | ICD-10-CM | POA: Diagnosis not present

## 2014-08-06 DIAGNOSIS — Q231 Congenital insufficiency of aortic valve: Secondary | ICD-10-CM | POA: Insufficient documentation

## 2014-08-06 DIAGNOSIS — I77810 Thoracic aortic ectasia: Secondary | ICD-10-CM | POA: Diagnosis not present

## 2014-08-06 DIAGNOSIS — I352 Nonrheumatic aortic (valve) stenosis with insufficiency: Secondary | ICD-10-CM

## 2014-08-06 DIAGNOSIS — I351 Nonrheumatic aortic (valve) insufficiency: Secondary | ICD-10-CM

## 2014-08-06 HISTORY — PX: LEFT HEART CATHETERIZATION WITH CORONARY ANGIOGRAM: SHX5451

## 2014-08-06 HISTORY — PX: TEE WITHOUT CARDIOVERSION: SHX5443

## 2014-08-06 LAB — CBC
HCT: 39.1 % (ref 36.0–46.0)
Hemoglobin: 12.9 g/dL (ref 12.0–15.0)
MCH: 30.3 pg (ref 26.0–34.0)
MCHC: 33 g/dL (ref 30.0–36.0)
MCV: 91.8 fL (ref 78.0–100.0)
Platelets: 351 10*3/uL (ref 150–400)
RBC: 4.26 MIL/uL (ref 3.87–5.11)
RDW: 13.4 % (ref 11.5–15.5)
WBC: 6.6 10*3/uL (ref 4.0–10.5)

## 2014-08-06 LAB — BASIC METABOLIC PANEL
Anion gap: 11 (ref 5–15)
BUN: 12 mg/dL (ref 6–23)
CHLORIDE: 103 meq/L (ref 96–112)
CO2: 25 meq/L (ref 19–32)
Calcium: 9.1 mg/dL (ref 8.4–10.5)
Creatinine, Ser: 0.88 mg/dL (ref 0.50–1.10)
GFR calc Af Amer: >90 mL/min (ref >90)
GFR calc non Af Amer: 78 mL/min — ABNORMAL LOW (ref >90)
Glucose, Bld: 96 mg/dL (ref 70–99)
Potassium: 4.1 mEq/L (ref 3.7–5.3)
Sodium: 139 mEq/L (ref 137–147)

## 2014-08-06 LAB — PROTIME-INR
INR: 1.1 (ref 0.00–1.49)
Prothrombin Time: 14.4 seconds (ref 11.6–15.2)

## 2014-08-06 SURGERY — LEFT HEART CATHETERIZATION WITH CORONARY ANGIOGRAM
Anesthesia: LOCAL

## 2014-08-06 SURGERY — ECHOCARDIOGRAM, TRANSESOPHAGEAL
Anesthesia: Moderate Sedation

## 2014-08-06 MED ORDER — FENTANYL CITRATE 0.05 MG/ML IJ SOLN
INTRAMUSCULAR | Status: AC
  Filled 2014-08-06: qty 2

## 2014-08-06 MED ORDER — BUTAMBEN-TETRACAINE-BENZOCAINE 2-2-14 % EX AERO
INHALATION_SPRAY | CUTANEOUS | Status: DC | PRN
  Administered 2014-08-06: 2 via TOPICAL

## 2014-08-06 MED ORDER — SODIUM CHLORIDE 0.9 % IV SOLN
INTRAVENOUS | Status: DC
  Administered 2014-08-06: 09:00:00 via INTRAVENOUS

## 2014-08-06 MED ORDER — SODIUM CHLORIDE 0.45 % IV SOLN
INTRAVENOUS | Status: AC
  Administered 2014-08-06: 225 mL via INTRAVENOUS

## 2014-08-06 MED ORDER — SODIUM CHLORIDE 0.9 % IJ SOLN: 3.0000 mL | INTRAMUSCULAR | Status: DC | PRN

## 2014-08-06 MED ORDER — SODIUM CHLORIDE 0.9 % IV SOLN: 250.0000 mL | INTRAVENOUS | Status: DC | PRN

## 2014-08-06 MED ORDER — OXYCODONE-ACETAMINOPHEN 5-325 MG PO TABS: 1.0000 | ORAL_TABLET | ORAL | Status: DC | PRN

## 2014-08-06 MED ORDER — MIDAZOLAM HCL 10 MG/2ML IJ SOLN
INTRAMUSCULAR | Status: DC | PRN
  Administered 2014-08-06 (×2): 2 mg via INTRAVENOUS
  Administered 2014-08-06: 1 mg via INTRAVENOUS
  Administered 2014-08-06: 2 mg via INTRAVENOUS

## 2014-08-06 MED ORDER — MIDAZOLAM HCL 5 MG/ML IJ SOLN
INTRAMUSCULAR | Status: AC
  Filled 2014-08-06: qty 2

## 2014-08-06 MED ORDER — DIAZEPAM 2 MG PO TABS: 2.0000 mg | ORAL_TABLET | Freq: Four times a day (QID) | ORAL | Status: DC | PRN

## 2014-08-06 MED ORDER — SODIUM CHLORIDE 0.9 % IJ SOLN: 3.0000 mL | Freq: Two times a day (BID) | INTRAMUSCULAR | Status: DC

## 2014-08-06 MED ORDER — LIDOCAINE HCL (PF) 1 % IJ SOLN
INTRAMUSCULAR | Status: AC
  Filled 2014-08-06: qty 30

## 2014-08-06 MED ORDER — NITROGLYCERIN 1 MG/10 ML FOR IR/CATH LAB
INTRA_ARTERIAL | Status: AC
  Filled 2014-08-06: qty 10

## 2014-08-06 MED ORDER — HEPARIN (PORCINE) IN NACL 2-0.9 UNIT/ML-% IJ SOLN
INTRAMUSCULAR | Status: AC
  Filled 2014-08-06: qty 1500

## 2014-08-06 MED ORDER — DIPHENHYDRAMINE HCL 50 MG/ML IJ SOLN
INTRAMUSCULAR | Status: AC
  Filled 2014-08-06: qty 1

## 2014-08-06 MED ORDER — MIDAZOLAM HCL 2 MG/2ML IJ SOLN
INTRAMUSCULAR | Status: AC
  Filled 2014-08-06: qty 2

## 2014-08-06 MED ORDER — FENTANYL CITRATE 0.05 MG/ML IJ SOLN
INTRAMUSCULAR | Status: DC | PRN
  Administered 2014-08-06 (×2): 25 ug via INTRAVENOUS

## 2014-08-06 NOTE — Interval H&P Note (Signed)
History and Physical Interval Note:  08/06/2014 8:55 AM  Elizabeth Hayden  has presented today for surgery, with the diagnosis of ascending aortic aneursym  The various methods of treatment have been discussed with the patient and family. After consideration of risks, benefits and other options for treatment, the patient has consented to  Procedure(s): TRANSESOPHAGEAL ECHOCARDIOGRAM (TEE) (N/A) as a surgical intervention .  The patient's history has been reviewed, patient examined, no change in status, stable for surgery.  I have reviewed the patient's chart and labs.  Questions were answered to the patient's satisfaction.     Charlton HawsPeter Derion Kreiter

## 2014-08-06 NOTE — H&P (View-Only) (Signed)
Patient ID: Elizabeth Hayden, female   DOB: 03/08/1971, 43 y.o.   MRN: [id number] [name] returns today for followup. She has a history of bicuspid aortic valve with coarctation repair. She has been doing well, she has not had any significant PND, orthopnea, chest pain or palpitations. She has had a long standing history of  chronic edema, this tends to be generalizes body edema both in her legs, and her hands and in her face. No longer on lasix for edema She is being seen by Dr. 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 Wall thickness was normal. Systolic function was normal. The estimated ejection fraction was in the range of 60% to 65%. - Aorta: The aorta was moderately dilated.  Indicates coarctation but no mean gradient given.  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  Do not think that she needs concomitant distal aortic bypass for coarctation.  Given her young age I would think that despite bicuspid valve a "valve sparing " operation  Would be done as she has no significant AS/AR.  Will need to arrange TEE and left heart cath prior to any considered surgery to r/o CAD and assess morphology of aortic valve better.      ROS: Denies fever, malais, weight loss, blurry vision, decreased visual acuity, cough, sputum, SOB, hemoptysis, pleuritic pain, palpitaitons, heartburn, abdominal pain, melena, lower extremity edema, claudication, or rash.  All other systems reviewed and negative  General: Affect appropriate Healthy:  appears stated age HEENT: normal Neck supple with no adenopathy JVP normal no bruits no thyromegaly Lungs clear with no wheezing and good diaphragmatic motion Heart:  S1/S2 soft SEM no AR  Murmur no coarctation murmur , no rub, gallop or click PMI normal  Previous left lateral thoracotomy scar from coarctation repair  Abdomen: benighn, BS positve, no tenderness, no AAA no bruit.  No HSM or HJR Distal pulses intact with no bruits No edema Neuro non-focal Skin warm and dry No muscular weakness   Current Outpatient Prescriptions  Medication Sig Dispense Refill  . CALCIUM-VITAMIN D PO Take 2 tablets by mouth daily.        . FORFIVO XL 450 MG  TB24 Take 1 tablet by mouth  daily  90 tablet  1  . Multiple Vitamin (MULTIVITAMIN) capsule Take 1 capsule by mouth daily.        . ranitidine (ZANTAC) 150 MG tablet Take 150 mg by mouth as needed.        No current facility-administered medications for this visit.    Allergies  Ace inhibitors  Electrocardiogram:  SR rate 90 LAFB   Assessment and Plan  

## 2014-08-06 NOTE — Interval H&P Note (Signed)
History and Physical Interval Note:  08/06/2014 8:56 AM  Elizabeth Hayden  has presented today for surgery, with the diagnosis of pre-op clearance, ascending aneurism   The various methods of treatment have been discussed with the patient and family. After consideration of risks, benefits and other options for treatment, the patient has consented to  Procedure(s): LEFT HEART CATHETERIZATION WITH CORONARY ANGIOGRAM (N/A) as a surgical intervention .  The patient's history has been reviewed, patient examined, no change in status, stable for surgery.  I have reviewed the patient's chart and labs.  Questions were answered to the patient's satisfaction.     Charlton HawsPeter Oriyah Lamphear

## 2014-08-06 NOTE — CV Procedure (Signed)
TEE:  Indication Bicuspid Aortic valve Aortic root aneurysm  6 mg versed and 50ug fentanyl  Severe aortic root enlargement 4.9 cm.  Bicuspid Aortic valve with no significant AS/AR suitable for valve sparing EF normal 60%  Normal mitral , pulmonary and tricuspid valves.  No ASD or effusion.  Could not visualize area Of coarctation repair Minimal diameter of descending thoracic aorta 2.0 cm.    Impression:  Bicuspid AV with no AS/AR suitable for valve sparing Severe aortic root enlargement 4.9 cm Normal MV, TV, PV EF 60% No effusion No ASD  Elizabeth HawsPeter Ajiah Hayden

## 2014-08-06 NOTE — CV Procedure (Signed)
   Cardiac Catheterization Procedure Note  Name: Theone StanleyMelissa M Lardner MRN: [id number] DOB: 03/25/1971  Procedure: Left Heart Cath, Selective Coronary Angiography, LV angiography  Indication:  Bicuspid aortic valve , Coarctation Pre surgical for Aortic Root aneurysm   Procedural details: The right groin was prepped, draped, and anesthetized with 1% lidocaine. Using modified Seldinger technique, a 5 French sheath was introduced into the right femoral artery. Standard Judkins catheters were used for coronary angiography and left ventriculography. Catheter exchanges were performed over a guidewire. There were no immediate procedural complications. The patient was transferred to the post catheterization recovery area for further monitoring.  Procedural Findings: Hemodynamics:  AO  123 65  LV     Coronary angiography: Coronary dominance: right  Left mainstem:  Normal  Left anterior descending (LAD):  Normal   D1: normal  D2: normal  D3: normal  Left circumflex (LCx):  Left dominant normal   OM1: normal  OM2: normal  PLD/PLA:  Multiple small branches normal  Right coronary artery (RCA):  Nondominant normal  Aortograpy:  Severe aortic root enlargement no disection  Mild narrowing at sight of previous coarctation repair with no gradient    Final Conclusions:  No CAD  Recommendations:  Patient has a 5.0 cm aortic root that has increased in size.  Refer to Dr Laneta SimmersBartle for valve sparing aortic root replacement.  No gradient across coarctation repair with MLD by MRI over 1.5 cm    Charlton HawsPeter Lonnie Reth 08/06/2014, 10:38 AM

## 2014-08-06 NOTE — Progress Notes (Signed)
Site area: RFA Site Prior to Removal:  Level 0 Pressure Applied For:20 min Manual:    Patient Status During Pull:  stable Post Pull Site:  Level Post Pull Instructions Given:   Post Pull Pulses Present: palpable pt Dressing Applied:  clear Bedrest begins @ 1100am Comments:no complications pulled by [employer] Tim[employer] Company[employer]ondra RN

## 2014-08-06 NOTE — Progress Notes (Signed)
  Echocardiogram Transesophageal Echocardiogram has been performed.  Cathie BeamsGREGORY, Jesiah Yerby 08/06/2014, 9:37 AM

## 2014-08-06 NOTE — Discharge Instructions (Addendum)
Keep appt with Dr Laneta SimmersBartle 08/15/14 Call office if any concerns about cath sight Angiogram, Care After Refer to this sheet in the next few weeks. These instructions provide you with information on caring for yourself after your procedure. Your health care provider may also give you more specific instructions. Your treatment has been planned according to current medical practices, but problems sometimes occur. Call your health care provider if you have any problems or questions after your procedure.  WHAT TO EXPECT AFTER THE PROCEDURE After your procedure, it is typical to have the following sensations:  Minor discomfort or tenderness and a small bump at the catheter insertion site. The bump should usually decrease in size and tenderness within 1 to 2 weeks.  Any bruising will usually fade within 2 to 4 weeks. HOME CARE INSTRUCTIONS   You may need to keep taking blood thinners if they were prescribed for you. Take medicines only as directed by your health care provider.  Do not apply powder or lotion to the site.  Do not take baths, swim, or use a hot tub until your health care provider approves.  You may shower 24 hours after the procedure. Remove the bandage (dressing) and gently wash the site with plain soap and water. Gently pat the site dry.  Inspect the site at least twice daily.  Limit your activity for the first 48 hours. Do not bend, squat, or lift anything over 20 lb (9 kg) or as directed by your health care provider.  Plan to have someone take you home after the procedure. Follow instructions about when you can drive or return to work. SEEK MEDICAL CARE IF:  You get light-headed when standing up.  You have drainage (other than a small amount of blood on the dressing).  You have chills.  You have a fever.  You have redness, warmth, swelling, or pain at the insertion site. SEEK IMMEDIATE MEDICAL CARE IF:   You develop chest pain or shortness of breath, feel faint, or pass  out.  You have bleeding, swelling larger than a walnut, or drainage from the catheter insertion site.  You develop pain, discoloration, coldness, or severe bruising in the leg or arm that held the catheter.  You develop bleeding from any other place, such as the bowels. You may see bright red blood in your urine or stools, or your stools may appear black and tarry.  You have heavy bleeding from the site. If this happens, hold pressure on the site. MAKE SURE YOU:  Understand these instructions.  Will watch your condition.  Will get help right away if you are not doing well or get worse. Document Released: 04/16/2005 Document Revised: 02/12/2014 Document Reviewed: 02/20/2013 [facility] Patient Information 2015 [employer] [place]. This information is not intended to replace advice given to you by your health care provider. Make sure you discuss any questions you have with your health care provider.

## 2014-08-07 ENCOUNTER — Encounter (HOSPITAL_COMMUNITY): Payer: Self-pay | Admitting: Cardiovascular Disease

## 2014-08-15 ENCOUNTER — Encounter: Payer: Self-pay | Admitting: Surgery

## 2014-08-15 ENCOUNTER — Institutional Professional Consult (permissible substitution) (INDEPENDENT_AMBULATORY_CARE_PROVIDER_SITE_OTHER): Payer: 59 | Admitting: Surgery

## 2014-08-15 VITALS — BP 148/99 | HR 81 | Ht 68.0 in | Wt 148.0 lb

## 2014-08-15 DIAGNOSIS — Z736 Limitation of activities due to disability: Secondary | ICD-10-CM

## 2014-08-15 DIAGNOSIS — I719 Aortic aneurysm of unspecified site, without rupture: Secondary | ICD-10-CM

## 2014-08-16 ENCOUNTER — Other Ambulatory Visit: Payer: Self-pay | Admitting: *Deleted

## 2014-08-16 ENCOUNTER — Encounter: Payer: Self-pay | Admitting: Surgery

## 2014-08-16 DIAGNOSIS — I7121 Aneurysm of the ascending aorta, without rupture: Secondary | ICD-10-CM

## 2014-08-16 DIAGNOSIS — I712 Thoracic aortic aneurysm, without rupture: Secondary | ICD-10-CM

## 2014-08-16 NOTE — Progress Notes (Signed)
Cardiothoracic Surgery Consultation   PCP is Roxy Manns, MD Referring Provider is Wendall Stade, MD  Chief Complaint  Patient presents with  . NEW CARDIAC    ASCENDING ANEURYSM    HPI:  The patient is a 43 year old woman with a history of bicuspid aortic valve, coarctation repair at age 30 through a left thoracotomy, and known aortic root and ascending aortic dilation. A 2D echo in 09/2011 showed an aortic root diameter of 42 mm, STJ diameter of 40 mm, and ascending aortic diameter of 49 mm. An MRA in 10/2011 showed the diameter of the ascending aorta at the level of the LPA to be 45 mm compared to the descending aorta that was 23 mm. A recent MRA on 07/23/2014 shows the ascending aorta to be 51 mm. The left subclavian artery has its origin from the descending aorta. The aorta in the area of the DA is narrowed to 17 mm but there is no gradient. Cardiac MR shows a bicuspid valve with no significant stenosis or regurgitation and normal EF of 71%. She is doing well and denies any shortness of breath. She has occassional chest discomfort that is vague. She has a history of chronic generalized edema of unclear etiology but is no longer on lasix for that and it has not been a problem lately.  Past Medical History  Diagnosis Date  . Allergy   . Depression   . TMJ (dislocation of temporomandibular joint)     Wears a night gear   . Congenital heart disease     bicuspid aortic valve with coarctation repair 04/1974  . Aortic root dilation   . GERD (gastroesophageal reflux disease)   . Edema   . Fibrocystic breast   . HPV (human papilloma virus) infection     Past Surgical History  Procedure Laterality Date  . Coarctation of aorta excision  1975  . Eye surgery    . Tee without cardioversion N/A 08/06/2014    Procedure: TRANSESOPHAGEAL ECHOCARDIOGRAM (TEE);  Surgeon: Wendall Stade, MD;  Location: [facility] ENDOSCOPY;  Service: Cardiovascular;  Laterality: N/A;    Family History  Problem  Relation Age of Onset  . Hypertension Father   . Diabetes Father   . Cancer Father     lung   . Breast cancer Maternal Grandmother   . Leukemia Maternal Grandmother   . Ovarian cancer Paternal Grandmother     Social History History  Substance Use Topics  . Smoking status: Never Smoker   . Smokeless tobacco: Not on file  . Alcohol Use: Yes     Comment: occasional    Current Outpatient Prescriptions  Medication Sig Dispense Refill  . CALCIUM-VITAMIN D PO Take 2 tablets by mouth daily.      . FORFIVO XL 450 MG TB24 Take 1 tablet by mouth  daily 90 tablet 1  . Multiple Vitamin (MULTIVITAMIN) capsule Take 1 capsule by mouth daily.      . ranitidine (ZANTAC) 75 MG tablet Take 150 mg by mouth daily.     No current facility-administered medications for this visit.    Allergies  Allergen Reactions  . Ace Inhibitors     REACTION: rash and cough    Review of Systems  Constitutional: Negative for fever, chills, diaphoresis, fatigue and unexpected weight change.       Not physically active.  HENT: Negative.   Eyes: Negative.   Respiratory: Negative.   Cardiovascular: Positive for leg swelling.  Occassional chest pain that is not exertional  Gastrointestinal: Negative.   Endocrine: Negative.   Genitourinary: Negative.   Musculoskeletal: Negative.   Skin: Negative.   Allergic/Immunologic: Negative.   Neurological: Negative.   Hematological: Negative.   Psychiatric/Behavioral:       Depression    BP 148/99 mmHg  Pulse 81  Ht 5\' 8"  (1.727 m)  Wt 148 lb (67.132 kg)  BMI 22.51 kg/m2  SpO2 97%  LMP 07/12/2014 Physical Exam  Constitutional: She is oriented to person, place, and time. She appears well-developed and well-nourished. No distress.  HENT:  Head: Normocephalic and atraumatic.  Mouth/Throat: Oropharynx is clear and moist.  Eyes: EOM are normal. Pupils are equal, round, and reactive to light.  Neck: Normal range of motion. Neck supple. No JVD present. No  tracheal deviation present. No thyromegaly present.  Cardiovascular: Normal rate, regular rhythm, normal heart sounds and intact distal pulses.   1/6 systolic murmur over aorta  Pulmonary/Chest: Effort normal and breath sounds normal. No respiratory distress. She has no wheezes. She has no rales.  Left posterolateral thoracotomy scar  Abdominal: Soft. Bowel sounds are normal. She exhibits no distension and no mass. There is no tenderness.  Musculoskeletal: Normal range of motion. She exhibits no edema or tenderness.  Lymphadenopathy:    She has no cervical adenopathy.  Neurological: She is alert and oriented to person, place, and time. She has normal strength. No cranial nerve deficit or sensory deficit.  Skin: Skin is warm and dry.  Psychiatric: She has a normal mood and affect.     Diagnostic Tests:   CLINICAL DATA: Bicuspid Aortic Valve and Aortic Aneurysm  EXAM: CARDIAC MRI  TECHNIQUE: The patient was scanned on a 1.5 Tesla GE magnet. A dedicated cardiac coil was used. Functional imaging was done using Fiesta sequences. 2,3, and 4 chamber views were done to assess for RWMA's. Modified Simpson's rule using a short axis stack was used to calculate an ejection fraction on a dedicated work [employer]. The patient received 15 cc of Multihance. After 10 minutes inversion recovery sequences were used to assess for infiltration and scar tissue.  CONTRAST: 15 cc multihance  FINDINGS: All 4 cardiac chambers were normal in size and function. There was no ASD/VSD. The mitral and tricuspid valves appeared normal. The aortic valve was bicuspid. There did not appear to be any significant stenosis or regurgitation. The quantitative EF was 71% (EDV 85 cc ESV 25 cc SV 60 cc) The ascending aorta was enlarged This started at the level of the sinus of valsalva but was largest in cross sectional diameter at the right MPA By IIR axial black blood imaging and, MRA the  maximum diameter was 5.0 cm There was no evidence of dissection or penetrating hematoma  Aortic MRA: Aortic MRA confirms severe ascending aortic root dilatation. 5.0 cm. There was an aberrant left subclavian take off from the descending thoracic aorta. The patient has had a previous coarctation repair. There is some re narrowing in the area of the PDA with minimal luminal diameter of 1.5 cm and mild post stenotic dilatation at 2.3 cm There is no significant gradient.  IMPRESSION: 1) Increase in size of ascending aortic aneurysm 5.0 cm compared to 11/03/11 where it measured 4.5 cm.  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  On: 07/23/2014 18:49     CLINICAL DATA: AORTIC DILITATION BICUSPID AORTIC VALVE COART REPAIR  EXAM: MRA CHEST WITH OR WITHOUT CONTRAST  TECHNIQUE: Angiographic images of the chest were obtained using MRA technique without and with intravenous contrast.  CONTRAST: 24mL MULTIHANCE GADOBENATE DIMEGLUMINE 529 MG/ML IV SOLN  COMPARISON: 11/03/2011 and earlier studies  FINDINGS: The thoracic aorta measures 4.2 cm maximum transverse diameter at the level of the sinuses of Valsalva, 5.1 cm mid ascending, 3.6 cm distal ascending/ proximal arch, 2.2 cm distal arch, 2.6 cm proximal descending, with mild tapered narrowing to 17 mm CT in its proximal segment, returned to a diameter of 2.4 cm in the mid descending, tapering down to 2.2 cm in the distal descending just above the diaphragm. Origins of the right brachiocephalic and left common carotid arteries widely patent. There is a distal origin of the left subclavian artery with a broad  infundibulum, widely patent. Mild irregularity and narrowing of the descending thoracic aorta just distal to the subclavian origin as above. No dissection or hemodynamically significant stenosis.  No pleural or pericardial effusion. No hilar or mediastinal adenopathy identified. Visualized segments of upper abdomen unremarkable. Multiple fluid signal lesions in bilateral breasts, incompletely characterized. Small mid thoracic disc protrusion is noted without spinal stenosis.  Cardiac morphology segment of the examination will be dictated separately by supervising cardiologist.  IMPRESSION: 1. Minimal increase in size of 5.1 cm ascending aortic aneurysm. 2. Distal left subclavian artery origin with persistent mild tapered narrowing of the proximal descending aorta, 17 mm diameter.   Electronically Signed  By: Oley Balmaniel Hassell M.D.  On: 07/24/2014 10:13    TEE: Indication Bicuspid Aortic valve Aortic root aneurysm 6 mg versed and 50ug fentanyl  Severe aortic root enlargement 4.9 cm. Bicuspid Aortic valve with no significant AS/AR suitable for valve sparing EF normal 60% Normal mitral , pulmonary and tricuspid valves. No ASD or effusion. Could not visualize area Of coarctation repair Minimal diameter of descending thoracic aorta 2.0 cm.   Impression:  Bicuspid AV with no AS/AR suitable for valve sparing Severe aortic root enlargement 4.9 cm Normal MV, TV, PV EF 60% No effusion No ASD  Charlton HawsPeter Nishan          Cardiac Catheterization Procedure Note  Name: Elizabeth Hayden MRN: [id number] DOB: 12/29/1970  Procedure: Left Heart Cath, Selective Coronary Angiography, LV angiography  Indication: Bicuspid aortic valve , Coarctation Pre surgical for Aortic Root aneurysm  Procedural details: The right groin was prepped, draped, and anesthetized with 1% lidocaine. Using modified Seldinger technique, a 5 French sheath was introduced into the right  femoral artery. Standard Judkins catheters were used for coronary angiography and left ventriculography. Catheter exchanges were performed over a guidewire. There were no immediate procedural complications. The patient was transferred to the post catheterization recovery area for further monitoring.  Procedural Findings: Hemodynamics:  AO 123 65  LV   Coronary angiography: Coronary dominance: right  Left mainstem: Normal  Left anterior descending (LAD): Normal  D1: normal D2: normal D3: normal  Left circumflex (LCx): Left dominant normal  OM1: normal OM2: normal PLD/PLA: Multiple small branches normal  Right coronary artery (RCA): Nondominant normal  Aortograpy: Severe aortic root enlargement no disection Mild narrowing at sight of previous coarctation repair with no gradient   Final Conclusions: No CAD  Recommendations: Patient has a 5.0 cm aortic root that  has increased in size. Refer to Dr Laneta SimmersBartle for valve sparing aortic root replacement. No gradient across coarctation repair with MLD by MRI over 1.5 cm   Charlton HawsPeter Nishan 08/06/2014, 10:38 AM       Impression:  She has an aortic root and ascending aortic aneurysm with a maximum diameter in the mid ascending aorta of 5.1 cm. This has increased from 4.5 cm in 2013. She has a bicuspid aortic valve that structurally looks good with no stenosis or regurgitation and looks suitable for a valve sparing aortic replacement. Her aneurysm is at a size that should be replaced especially with a bicuspid valve in young person. I discussed the very small chance of having to replace her valve and the recommendation for a mechanical valve given her age. She understands that she would have to be on lifelong coumadin and is agreement with that. She is not planning any further pregnancies. I discussed the operative procedure with the patient and  family including alternatives, benefits and risks; including but not limited to bleeding, blood transfusion, infection, stroke, myocardial infarction, graft failure, heart block requiring a permanent pacemaker, organ dysfunction, and death.  Elizabeth Hayden understands and agrees to proceed.    Plan:  Aortic valve sparing root and ascending aortic replacement with circulatory arrest on Monday 09/03/2014.

## 2014-08-20 ENCOUNTER — Encounter: Payer: Self-pay | Admitting: Family Medicine

## 2014-08-20 ENCOUNTER — Ambulatory Visit (INDEPENDENT_AMBULATORY_CARE_PROVIDER_SITE_OTHER): Payer: 59 | Admitting: Family Medicine

## 2014-08-20 VITALS — BP 132/96 | HR 86 | Temp 97.8°F | Ht 68.0 in | Wt 144.8 lb

## 2014-08-20 DIAGNOSIS — B9789 Other viral agents as the cause of diseases classified elsewhere: Principal | ICD-10-CM

## 2014-08-20 DIAGNOSIS — J069 Acute upper respiratory infection, unspecified: Secondary | ICD-10-CM | POA: Insufficient documentation

## 2014-08-20 MED ORDER — AMOXICILLIN 500 MG PO CAPS: 500.0000 mg | ORAL_CAPSULE | Freq: Three times a day (TID) | ORAL | Status: DC

## 2014-08-20 NOTE — Progress Notes (Signed)
Subjective:    Patient ID: Theone StanleyMelissa M Skarda, female    DOB: 05/08/1971, 43 y.o.   MRN: [id number]  HPI Here for uri symptoms   Is supposed to have cardiac surgery on the 3rd of Dec   Started symptoms Friday- scratchy throat and ache in jaw  Went on for several days Took advil cold and sinus Then cough and drainage  Taken mucinex DM - helping break up the phlegm  Not spitting anything out   No fever   No wheeze   (but cough has a barking quality)  Patient Active Problem List   Diagnosis Date Noted  . Neck pain on left side 06/01/2014  . Other screening mammogram 07/25/2012  . Coarctation of aorta 11/24/2011  . Aortic aneurysm 11/24/2011  . Abnormal Pap smear and cervical HPV (human papillomavirus) 05/31/2011  . Gynecological examination 05/19/2011  . Routine general medical examination at a health care facility 05/19/2011  . CERV HIGH RISK HUMAN PAPILLOMAVIRUS DNA TEST POS 11/14/2009  . History of eating disorder 11/02/2007  . DEPRESSION 11/02/2007  . CARDIOMYOPATHY, HYPERTROPHIC, OBSTRUCTIVE 11/02/2007  . Allergic rhinitis 11/02/2007  . BICUSPID AORTIC VALVE 11/02/2007   Past Medical History  Diagnosis Date  . Allergy   . Depression   . TMJ (dislocation of temporomandibular joint)     Wears a night gear   . Congenital heart disease     bicuspid aortic valve with coarctation repair 04/1974  . Aortic root dilation   . GERD (gastroesophageal reflux disease)   . Edema   . Fibrocystic breast   . HPV (human papilloma virus) infection    Past Surgical History  Procedure Laterality Date  . Coarctation of aorta excision  1975  . Eye surgery    . Tee without cardioversion N/A 08/06/2014    Procedure: TRANSESOPHAGEAL ECHOCARDIOGRAM (TEE);  Surgeon: Wendall StadePeter C Nishan, MD;  [facility];  Service: Cardiovascular;  Laterality: N/A;   History  Substance Use Topics  . Smoking status: Never Smoker   . Smokeless tobacco: Not on file  . Alcohol Use: 0.0 oz/week    0  Not specified per week     Comment: occasional   Family History  Problem Relation Age of Onset  . Hypertension Father   . Diabetes Father   . Cancer Father     lung   . Breast cancer Maternal Grandmother   . Leukemia Maternal Grandmother   . Ovarian cancer Paternal Grandmother    Allergies  Allergen Reactions  . Ace Inhibitors     REACTION: rash and cough   Current Outpatient Prescriptions on File Prior to Visit  Medication Sig Dispense Refill  . CALCIUM-VITAMIN D PO Take 2 tablets by mouth daily.      . FORFIVO XL 450 MG TB24 Take 1 tablet by mouth  daily 90 tablet 1  . Multiple Vitamin (MULTIVITAMIN) capsule Take 1 capsule by mouth daily.      . ranitidine (ZANTAC) 75 MG tablet Take 150 mg by mouth daily.     No current facility-administered medications on file prior to visit.      Review of Systems    Review of Systems  Constitutional: Negative for fever, appetite change,  and unexpected weight change.  ENt pos for cong and rhinorrhea and ST , neg for sinus pain  Eyes: Negative for pain and visual disturbance.  Respiratory: Negative for wheeze and shortness of breath.   Cardiovascular: Negative for cp or palpitations    Gastrointestinal: Negative  for nausea, diarrhea and constipation.  Genitourinary: Negative for urgency and frequency.  Skin: Negative for pallor or rash   Neurological: Negative for weakness, light-headedness, numbness and headaches.  Hematological: Negative for adenopathy. Does not bruise/bleed easily.  Psychiatric/Behavioral: Negative for dysphoric mood. The patient is not nervous/anxious.      Objective:   Physical Exam  Constitutional: She appears well-developed and well-nourished. No distress.  HENT:  Head: Normocephalic and atraumatic.  Right Ear: External ear normal.  Left Ear: External ear normal.  Mouth/Throat: Oropharynx is clear and moist. No oropharyngeal exudate.  Nares are injected and congested  Clear rhinorrhea Mild maxillary  sinus tenderness   Eyes: Conjunctivae and EOM are normal. Pupils are equal, round, and reactive to light. Right eye exhibits no discharge. Left eye exhibits no discharge.  Neck: Normal range of motion. Neck supple.  Cardiovascular: Normal rate and regular rhythm.   Murmur heard. Pulmonary/Chest: Effort normal and breath sounds normal. No respiratory distress. She has no wheezes. She has no rales.  No crackles or rhonchi  Musculoskeletal: She exhibits no edema.  Lymphadenopathy:    She has no cervical adenopathy.  Neurological: She is alert.  Skin: Skin is warm and dry. No rash noted.  Psychiatric: She has a normal mood and affect.          Assessment & Plan:   Problem List Items Addressed This Visit      Respiratory   Viral URI with cough - Primary    In light of upcoming cardiac surgery (valvular) and some sinus pain -will err on the side of caution and px 7 d of amox 500 mg tid  Disc symptomatic care - see instructions on AVS mucinex prn Fluids and rest  Update if not starting to improve in a week or if worsening

## 2014-08-20 NOTE — Assessment & Plan Note (Signed)
In light of upcoming cardiac surgery (valvular) and some sinus pain -will err on the side of caution and px 7 d of amox 500 mg tid  Disc symptomatic care - see instructions on AVS mucinex prn Fluids and rest  Update if not starting to improve in a week or if worsening

## 2014-08-20 NOTE — Progress Notes (Signed)
Pre visit review using our clinic review tool, if applicable. No additional management support is needed unless otherwise documented below in the visit note. 

## 2014-08-20 NOTE — Patient Instructions (Signed)
Continue mucinex for congestion and cough  Drink lots of fluids  Rest  Take amoxicillin as directed and finish it all Update if not starting to improve in a week or if worsening

## 2014-08-30 ENCOUNTER — Other Ambulatory Visit (HOSPITAL_COMMUNITY): Payer: Self-pay

## 2014-08-30 ENCOUNTER — Encounter (HOSPITAL_COMMUNITY): Payer: Self-pay

## 2014-08-31 ENCOUNTER — Ambulatory Visit (HOSPITAL_COMMUNITY)
Admission: RE | Admit: 2014-08-31 | Discharge: 2014-08-31 | Disposition: A | Discharge status: Home / Self Care | Payer: 59 | Source: Ambulatory Visit | Attending: Surgery | Admitting: Surgery

## 2014-08-31 ENCOUNTER — Encounter (HOSPITAL_COMMUNITY)
Admission: RE | Admit: 2014-08-31 | Discharge: 2014-08-31 | Disposition: A | Discharge status: Home / Self Care | Payer: 59 | Source: Ambulatory Visit | Attending: Surgery | Admitting: Surgery

## 2014-08-31 ENCOUNTER — Encounter (HOSPITAL_COMMUNITY): Payer: Self-pay

## 2014-08-31 VITALS — BP 148/98 | HR 91 | Temp 98.5°F | Resp 18 | Ht 68.0 in | Wt 143.7 lb

## 2014-08-31 DIAGNOSIS — I712 Thoracic aortic aneurysm, without rupture: Secondary | ICD-10-CM | POA: Insufficient documentation

## 2014-08-31 DIAGNOSIS — F329 Major depressive disorder, single episode, unspecified: Secondary | ICD-10-CM | POA: Diagnosis present

## 2014-08-31 DIAGNOSIS — D62 Acute posthemorrhagic anemia: Secondary | ICD-10-CM | POA: Diagnosis not present

## 2014-08-31 DIAGNOSIS — E8779 Other fluid overload: Secondary | ICD-10-CM | POA: Diagnosis not present

## 2014-08-31 DIAGNOSIS — I7121 Aneurysm of the ascending aorta, without rupture: Secondary | ICD-10-CM

## 2014-08-31 DIAGNOSIS — Q231 Congenital insufficiency of aortic valve: Secondary | ICD-10-CM

## 2014-08-31 DIAGNOSIS — Z8249 Family history of ischemic heart disease and other diseases of the circulatory system: Secondary | ICD-10-CM

## 2014-08-31 DIAGNOSIS — D696 Thrombocytopenia, unspecified: Secondary | ICD-10-CM | POA: Diagnosis not present

## 2014-08-31 DIAGNOSIS — K219 Gastro-esophageal reflux disease without esophagitis: Secondary | ICD-10-CM | POA: Diagnosis present

## 2014-08-31 DIAGNOSIS — Z833 Family history of diabetes mellitus: Secondary | ICD-10-CM

## 2014-08-31 DIAGNOSIS — Z7982 Long term (current) use of aspirin: Secondary | ICD-10-CM

## 2014-08-31 DIAGNOSIS — Z806 Family history of leukemia: Secondary | ICD-10-CM

## 2014-08-31 DIAGNOSIS — Z79899 Other long term (current) drug therapy: Secondary | ICD-10-CM

## 2014-08-31 DIAGNOSIS — Z01810 Encounter for preprocedural cardiovascular examination: Secondary | ICD-10-CM

## 2014-08-31 HISTORY — DX: Candidiasis, unspecified: B37.9

## 2014-08-31 HISTORY — DX: Unspecified convulsions: R56.9

## 2014-08-31 HISTORY — DX: Urinary tract infection, site not specified: N39.0

## 2014-08-31 HISTORY — DX: Chronic sinusitis, unspecified: J32.9

## 2014-08-31 HISTORY — DX: Reserved for inherently not codable concepts without codable children: IMO0001

## 2014-08-31 LAB — URINALYSIS, ROUTINE W REFLEX MICROSCOPIC
Bilirubin Urine: NEGATIVE
GLUCOSE, UA: NEGATIVE mg/dL
Hgb urine dipstick: NEGATIVE
Ketones, ur: NEGATIVE mg/dL
Nitrite: NEGATIVE
PH: 6 (ref 5.0–8.0)
PROTEIN: NEGATIVE mg/dL
Specific Gravity, Urine: 1.011 (ref 1.005–1.030)
Urobilinogen, UA: 0.2 mg/dL (ref 0.0–1.0)

## 2014-08-31 LAB — PULMONARY FUNCTION TEST
DL/VA % pred: 107 %
DL/VA: 5.65 ml/min/mmHg/L
DLCO cor % pred: 98 %
DLCO cor: 29.3 ml/min/mmHg
DLCO unc % pred: 98 %
DLCO unc: 29.3 ml/min/mmHg
FEF 25-75 Post: 4.13 L/sec
FEF 25-75 Pre: 3.4 L/sec
FEF2575-%Change-Post: 21 %
FEF2575-%PRED-PRE: 107 %
FEF2575-%Pred-Post: 130 %
FEV1-%Change-Post: 5 %
FEV1-%Pred-Post: 93 %
FEV1-%Pred-Pre: 88 %
FEV1-Post: 3.07 L
FEV1-Pre: 2.91 L
FEV1FVC-%Change-Post: 2 %
FEV1FVC-%Pred-Pre: 102 %
FEV6-%CHANGE-POST: 2 %
FEV6-%PRED-POST: 88 %
FEV6-%PRED-PRE: 86 %
FEV6-PRE: 3.48 L
FEV6-Post: 3.58 L
FEV6FVC-%PRED-PRE: 102 %
FEV6FVC-%Pred-Post: 102 %
FVC-%Change-Post: 2 %
FVC-%PRED-POST: 86 %
FVC-%PRED-PRE: 84 %
FVC-POST: 3.58 L
FVC-PRE: 3.5 L
POST FEV1/FVC RATIO: 86 %
POST FEV6/FVC RATIO: 100 %
Pre FEV1/FVC ratio: 83 %
Pre FEV6/FVC Ratio: 100 %
RV % PRED: 95 %
RV: 1.81 L
TLC % PRED: 96 %
TLC: 5.47 L

## 2014-08-31 LAB — SURGICAL PCR SCREEN
MRSA, PCR: NEGATIVE
STAPHYLOCOCCUS AUREUS: NEGATIVE

## 2014-08-31 LAB — COMPREHENSIVE METABOLIC PANEL
ALBUMIN: 4.2 g/dL (ref 3.5–5.2)
ALT: 18 U/L (ref 0–35)
ANION GAP: 15 (ref 5–15)
AST: 22 U/L (ref 0–37)
Alkaline Phosphatase: 48 U/L (ref 39–117)
BILIRUBIN TOTAL: 0.6 mg/dL (ref 0.3–1.2)
BUN: 11 mg/dL (ref 6–23)
CO2: 21 meq/L (ref 19–32)
CREATININE: 0.76 mg/dL (ref 0.50–1.10)
Calcium: 9.6 mg/dL (ref 8.4–10.5)
Chloride: 103 mEq/L (ref 96–112)
GFR calc Af Amer: >90 mL/min (ref >90)
Glucose, Bld: 85 mg/dL (ref 70–99)
Potassium: 3.6 mEq/L — ABNORMAL LOW (ref 3.7–5.3)
Sodium: 139 mEq/L (ref 137–147)
Total Protein: 7.7 g/dL (ref 6.0–8.3)

## 2014-08-31 LAB — HEMOGLOBIN A1C
HEMOGLOBIN A1C: 5.2 % (ref <5.7)
MEAN PLASMA GLUCOSE: 103 mg/dL (ref <117)

## 2014-08-31 LAB — CBC
HCT: 41.4 % (ref 36.0–46.0)
Hemoglobin: 13.8 g/dL (ref 12.0–15.0)
MCH: 30 pg (ref 26.0–34.0)
MCHC: 33.3 g/dL (ref 30.0–36.0)
MCV: 90 fL (ref 78.0–100.0)
PLATELETS: 382 10*3/uL (ref 150–400)
RBC: 4.6 MIL/uL (ref 3.87–5.11)
RDW: 13.3 % (ref 11.5–15.5)
WBC: 9.8 10*3/uL (ref 4.0–10.5)

## 2014-08-31 LAB — URINE MICROSCOPIC-ADD ON

## 2014-08-31 LAB — PROTIME-INR
INR: 1.09 (ref 0.00–1.49)
Prothrombin Time: 14.2 seconds (ref 11.6–15.2)

## 2014-08-31 LAB — ABO/RH: ABO/RH(D): O POS

## 2014-08-31 LAB — APTT: aPTT: 39 seconds — ABNORMAL HIGH (ref 24–37)

## 2014-08-31 LAB — HCG, SERUM, QUALITATIVE: PREG SERUM: NEGATIVE

## 2014-08-31 MED ORDER — ALBUTEROL SULFATE (2.5 MG/3ML) 0.083% IN NEBU
2.5000 mg | INHALATION_SOLUTION | Freq: Once | RESPIRATORY_TRACT | Status: AC
  Administered 2014-08-31: 2.5 mg via RESPIRATORY_TRACT

## 2014-08-31 MED ORDER — CHLORHEXIDINE GLUCONATE 4 % EX LIQD: 30.0000 mL | CUTANEOUS | Status: DC

## 2014-08-31 NOTE — Progress Notes (Signed)
VASCULAR LAB PRELIMINARY  PRELIMINARY  PRELIMINARY  PRELIMINARY  Pre-op Cardiac Surgery  Carotid Findings:  Bilateral:  1-39% ICA stenosis.  Vertebral artery flow is antegrade.      Upper Extremity Right Left  Brachial Pressures 133  Triphasic  135  Triphasic   Radial Waveforms Triphasic  Triphasic   Ulnar Waveforms Triphasic  Triphasic   Palmar Arch (Allen's Test) Doppler obliterates with radial compression, normal with ulnar compression. Within normal limits     Adryen Cookson, RVT 08/31/2014, 6:14 PM

## 2014-08-31 NOTE — Progress Notes (Signed)
ABG unable to obtain at PAT-will redraw DOS

## 2014-08-31 NOTE — Progress Notes (Signed)
Cardiologist is Dr.Nishan with clearance in epic from   Heart cath 07/2014 in epic   Echo reports in epic from 2007/2010/2012/2015  Stress test done around 2003-2004  Medical Md is DR.Tower  Denies EKG in past month  Denies CXR in past 2 wks

## 2014-08-31 NOTE — Progress Notes (Signed)
Confirmed with pt that she did have her Dopplers and PFT's done today

## 2014-08-31 NOTE — Pre-Procedure Instructions (Signed)
Theone StanleyMelissa M Mctavish  08/31/2014   Your procedure is scheduled on:  Mon, Nov 23 @ 7:30 AM  [facility]  at 5:30 AM.  Call this number if you have problems the morning of surgery: [phone]   Remember:   Do not eat food or drink liquids after midnight.   Take these medicines the morning of surgery with A SIP OF WATER: Amoxicillin(Amoxil) and Zantac(Ranitidine)               Stop taking your Ibuprofen and Vitamins. No Goody's,BC's,Aleve,Aspirin,Ibuprofen,Fish Oil,or any Herbal Medications   Do not wear jewelry, make-up or nail polish.  Do not wear lotions, powders, or perfumes.   Do not shave 48 hours prior to surgery.   Do not bring valuables to the hospital.  [facility] is not responsible                  for any belongings or valuables.               Contacts, dentures or bridgework may not be worn into surgery.  Leave suitcase in the car. After surgery it may be brought to your room.  For patients admitted to the hospital, discharge time is determined by your                treatment team.                Special Instructions:  [facility] - Preparing for Surgery  Before surgery, you can play an important role.  Because skin is not sterile, your skin needs to be as free of germs as possible.  You can reduce the number of germs on you skin by washing with CHG (chlorahexidine gluconate) soap before surgery.  CHG is an antiseptic cleaner which kills germs and bonds with the skin to continue killing germs even after washing.  Please DO NOT use if you have an allergy to CHG or antibacterial soaps.  If your skin becomes reddened/irritated stop using the CHG and inform your nurse when you arrive at Short Stay.  Do not shave (including legs and underarms) for at least 48 hours prior to the first CHG shower.  You may shave your face.  Please follow these instructions carefully:   1.  Shower with CHG Soap the night before surgery and the                                 morning of Surgery.  2.  If you choose to wash your hair, wash your hair first as usual with your       normal shampoo.  3.  After you shampoo, rinse your hair and body thoroughly to remove the                      Shampoo.  4.  Use CHG as you would any other liquid soap.  You can apply chg directly       to the skin and wash gently with scrungie or a clean washcloth.  5.  Apply the CHG Soap to your body ONLY FROM THE NECK DOWN.        Do not use on open wounds or open sores.  Avoid contact with your eyes,       ears, mouth and genitals (private parts).  Wash genitals (private parts)       with  your normal soap.  6.  Wash thoroughly, paying special attention to the area where your surgery        will be performed.  7.  Thoroughly rinse your body with warm water from the neck down.  8.  DO NOT shower/wash with your normal soap after using and rinsing off       the CHG Soap.  9.  Pat yourself dry with a clean towel.            10.  Wear clean pajamas.            11.  Place clean sheets on your bed the night of your first shower and do not        sleep with pets.  Day of Surgery  Do not apply any lotions/deoderants the morning of surgery.  Please wear clean clothes to the hospital/surgery center.     Please read over the following fact sheets that you were given: Pain Booklet, Coughing and Deep Breathing, Blood Transfusion Information, MRSA Information and Surgical Site Infection Prevention

## 2014-09-01 LAB — TYPE AND SCREEN
ABO/RH(D): O POS
Antibody Screen: NEGATIVE

## 2014-09-02 MED ORDER — SODIUM CHLORIDE 0.9 % IV SOLN
INTRAVENOUS | Status: AC
  Administered 2014-09-03: 1.4 [IU]/h via INTRAVENOUS
  Filled 2014-09-02: qty 2.5

## 2014-09-02 MED ORDER — PLASMA-LYTE 148 IV SOLN
INTRAVENOUS | Status: DC
  Filled 2014-09-02: qty 2.5

## 2014-09-02 MED ORDER — SODIUM CHLORIDE 0.9 % IV SOLN
1250.0000 mg | INTRAVENOUS | Status: AC
  Administered 2014-09-03: 1250 mg via INTRAVENOUS
  Filled 2014-09-02 (×2): qty 1250

## 2014-09-02 MED ORDER — CEFUROXIME SODIUM 1.5 G IJ SOLR
1.5000 g | INTRAMUSCULAR | Status: AC
  Administered 2014-09-03: .75 g via INTRAVENOUS
  Administered 2014-09-03: 1.5 g via INTRAVENOUS
  Filled 2014-09-02: qty 1.5

## 2014-09-02 MED ORDER — DEXTROSE 5 % IV SOLN
750.0000 mg | INTRAVENOUS | Status: DC
  Filled 2014-09-02: qty 750

## 2014-09-02 MED ORDER — METOPROLOL TARTRATE 12.5 MG HALF TABLET: 12.5000 mg | ORAL_TABLET | Freq: Once | ORAL | Status: DC

## 2014-09-02 MED ORDER — SODIUM CHLORIDE 0.9 % IV SOLN
INTRAVENOUS | Status: DC
  Filled 2014-09-02: qty 30

## 2014-09-02 MED ORDER — POTASSIUM CHLORIDE 2 MEQ/ML IV SOLN
80.0000 meq | INTRAVENOUS | Status: DC
  Filled 2014-09-02: qty 40

## 2014-09-02 MED ORDER — DEXTROSE 5 % IV SOLN
0.0000 ug/min | INTRAVENOUS | Status: DC
  Filled 2014-09-02: qty 4

## 2014-09-02 MED ORDER — SODIUM CHLORIDE 0.9 % IV SOLN
INTRAVENOUS | Status: AC
  Administered 2014-09-03: 69.8 mL/h via INTRAVENOUS
  Filled 2014-09-02: qty 40

## 2014-09-02 MED ORDER — DOPAMINE-DEXTROSE 3.2-5 MG/ML-% IV SOLN
0.0000 ug/kg/min | INTRAVENOUS | Status: DC
  Filled 2014-09-02: qty 250

## 2014-09-02 MED ORDER — NITROGLYCERIN IN D5W 200-5 MCG/ML-% IV SOLN
2.0000 ug/min | INTRAVENOUS | Status: DC
  Filled 2014-09-02: qty 250

## 2014-09-02 MED ORDER — DEXMEDETOMIDINE HCL IN NACL 400 MCG/100ML IV SOLN
0.1000 ug/kg/h | INTRAVENOUS | Status: AC
  Administered 2014-09-03: 0.3 ug/kg/h via INTRAVENOUS
  Filled 2014-09-02: qty 100

## 2014-09-02 MED ORDER — PHENYLEPHRINE HCL 10 MG/ML IJ SOLN
30.0000 ug/min | INTRAVENOUS | Status: AC
  Administered 2014-09-03: 30 ug/min via INTRAVENOUS
  Filled 2014-09-02: qty 2

## 2014-09-02 MED ORDER — MAGNESIUM SULFATE 50 % IJ SOLN
40.0000 meq | INTRAMUSCULAR | Status: DC
  Filled 2014-09-02: qty 10

## 2014-09-03 ENCOUNTER — Ambulatory Visit (HOSPITAL_COMMUNITY): Payer: 59 | Admitting: Anesthesiology

## 2014-09-03 ENCOUNTER — Inpatient Hospital Stay (HOSPITAL_COMMUNITY): Payer: 59

## 2014-09-03 ENCOUNTER — Encounter (HOSPITAL_COMMUNITY)
Admission: RE | Disposition: A | Discharge status: Home / Self Care | Payer: 59 | Source: Ambulatory Visit | Attending: Surgery

## 2014-09-03 ENCOUNTER — Encounter (HOSPITAL_COMMUNITY): Payer: Self-pay | Admitting: Surgery

## 2014-09-03 ENCOUNTER — Inpatient Hospital Stay (HOSPITAL_COMMUNITY)
Admission: RE | Admit: 2014-09-03 | Discharge: 2014-09-08 | DRG: 220 | Disposition: A | Discharge status: Home / Self Care | Payer: 59 | Source: Ambulatory Visit | Attending: Surgery | Admitting: Surgery

## 2014-09-03 DIAGNOSIS — Z8249 Family history of ischemic heart disease and other diseases of the circulatory system: Secondary | ICD-10-CM | POA: Diagnosis not present

## 2014-09-03 DIAGNOSIS — I712 Thoracic aortic aneurysm, without rupture: Secondary | ICD-10-CM

## 2014-09-03 DIAGNOSIS — Z806 Family history of leukemia: Secondary | ICD-10-CM | POA: Diagnosis not present

## 2014-09-03 DIAGNOSIS — D696 Thrombocytopenia, unspecified: Secondary | ICD-10-CM | POA: Diagnosis not present

## 2014-09-03 DIAGNOSIS — E8779 Other fluid overload: Secondary | ICD-10-CM | POA: Diagnosis not present

## 2014-09-03 DIAGNOSIS — Z79899 Other long term (current) drug therapy: Secondary | ICD-10-CM | POA: Diagnosis not present

## 2014-09-03 DIAGNOSIS — I714 Abdominal aortic aneurysm, without rupture: Secondary | ICD-10-CM

## 2014-09-03 DIAGNOSIS — K219 Gastro-esophageal reflux disease without esophagitis: Secondary | ICD-10-CM | POA: Diagnosis present

## 2014-09-03 DIAGNOSIS — D62 Acute posthemorrhagic anemia: Secondary | ICD-10-CM | POA: Diagnosis not present

## 2014-09-03 DIAGNOSIS — Z7982 Long term (current) use of aspirin: Secondary | ICD-10-CM | POA: Diagnosis not present

## 2014-09-03 DIAGNOSIS — I7121 Aneurysm of the ascending aorta, without rupture: Secondary | ICD-10-CM

## 2014-09-03 DIAGNOSIS — F329 Major depressive disorder, single episode, unspecified: Secondary | ICD-10-CM | POA: Diagnosis present

## 2014-09-03 DIAGNOSIS — Z833 Family history of diabetes mellitus: Secondary | ICD-10-CM | POA: Diagnosis not present

## 2014-09-03 DIAGNOSIS — I719 Aortic aneurysm of unspecified site, without rupture: Secondary | ICD-10-CM

## 2014-09-03 DIAGNOSIS — Q231 Congenital insufficiency of aortic valve: Secondary | ICD-10-CM | POA: Diagnosis not present

## 2014-09-03 HISTORY — PX: INTRAOPERATIVE TRANSESOPHAGEAL ECHOCARDIOGRAM: SHX5062

## 2014-09-03 HISTORY — PX: ASCENDING AORTIC ROOT REPLACEMENT: SHX5729

## 2014-09-03 LAB — POCT I-STAT 3, ART BLOOD GAS (G3+)
ACID-BASE DEFICIT: 3 mmol/L — AB (ref 0.0–2.0)
ACID-BASE DEFICIT: 2 mmol/L (ref 0.0–2.0)
ACID-BASE DEFICIT: 4 mmol/L — AB (ref 0.0–2.0)
ACID-BASE EXCESS: 2 mmol/L (ref 0.0–2.0)
Acid-base deficit: 1 mmol/L (ref 0.0–2.0)
Bicarbonate: 24.6 mEq/L — ABNORMAL HIGH (ref 20.0–24.0)
Bicarbonate: 26.4 mEq/L — ABNORMAL HIGH (ref 20.0–24.0)
Bicarbonate: 23 mEq/L (ref 20.0–24.0)
Bicarbonate: 21.7 mEq/L (ref 20.0–24.0)
Bicarbonate: 20.5 mEq/L (ref 20.0–24.0)
Bicarbonate: 21.4 mEq/L (ref 20.0–24.0)
O2 SAT: 100 %
O2 SAT: 100 %
O2 SAT: 97 %
O2 Saturation: 100 %
O2 Saturation: 100 %
O2 Saturation: 99 %
PCO2 ART: 38 mmHg (ref 35.0–45.0)
PCO2 ART: 35.2 mmHg (ref 35.0–45.0)
PH ART: 7.419 (ref 7.350–7.450)
PH ART: 7.409 (ref 7.350–7.450)
PO2 ART: 418 mmHg — AB (ref 80.0–100.0)
PO2 ART: 152 mmHg — AB (ref 80.0–100.0)
Patient temperature: 36.8
TCO2: 26 mmol/L (ref 0–100)
TCO2: 28 mmol/L (ref 0–100)
TCO2: 24 mmol/L (ref 0–100)
TCO2: 23 mmol/L (ref 0–100)
TCO2: 21 mmol/L (ref 0–100)
TCO2: 23 mmol/L (ref 0–100)
pCO2 arterial: 40.5 mmHg (ref 35.0–45.0)
pCO2 arterial: 33.8 mmHg — ABNORMAL LOW (ref 35.0–45.0)
pCO2 arterial: 25.8 mmHg — ABNORMAL LOW (ref 35.0–45.0)
pCO2 arterial: 40.5 mmHg (ref 35.0–45.0)
pH, Arterial: 7.422 (ref 7.350–7.450)
pH, Arterial: 7.423 (ref 7.350–7.450)
pH, Arterial: 7.508 — ABNORMAL HIGH (ref 7.350–7.450)
pH, Arterial: 7.33 — ABNORMAL LOW (ref 7.350–7.450)
pO2, Arterial: 513 mmHg — ABNORMAL HIGH (ref 80.0–100.0)
pO2, Arterial: 482 mmHg — ABNORMAL HIGH (ref 80.0–100.0)
pO2, Arterial: 153 mmHg — ABNORMAL HIGH (ref 80.0–100.0)
pO2, Arterial: 102 mmHg — ABNORMAL HIGH (ref 80.0–100.0)

## 2014-09-03 LAB — POCT I-STAT, CHEM 8
BUN: 6 mg/dL (ref 6–23)
BUN: 5 mg/dL — AB (ref 6–23)
BUN: 5 mg/dL — ABNORMAL LOW (ref 6–23)
BUN: 6 mg/dL (ref 6–23)
BUN: 5 mg/dL — ABNORMAL LOW (ref 6–23)
BUN: 5 mg/dL — AB (ref 6–23)
BUN: 5 mg/dL — ABNORMAL LOW (ref 6–23)
BUN: 6 mg/dL (ref 6–23)
CALCIUM ION: 1.2 mmol/L (ref 1.12–1.23)
CALCIUM ION: 1.07 mmol/L — AB (ref 1.12–1.23)
CALCIUM ION: 1.16 mmol/L (ref 1.12–1.23)
CHLORIDE: 100 meq/L (ref 96–112)
CHLORIDE: 103 meq/L (ref 96–112)
CHLORIDE: 102 meq/L (ref 96–112)
CREATININE: 0.3 mg/dL — AB (ref 0.50–1.10)
Calcium, Ion: 1.22 mmol/L (ref 1.12–1.23)
Calcium, Ion: 1.08 mmol/L — ABNORMAL LOW (ref 1.12–1.23)
Calcium, Ion: 1.01 mmol/L — ABNORMAL LOW (ref 1.12–1.23)
Calcium, Ion: 1.05 mmol/L — ABNORMAL LOW (ref 1.12–1.23)
Calcium, Ion: 1.05 mmol/L — ABNORMAL LOW (ref 1.12–1.23)
Chloride: 103 mEq/L (ref 96–112)
Chloride: 99 mEq/L (ref 96–112)
Chloride: 103 mEq/L (ref 96–112)
Chloride: 103 mEq/L (ref 96–112)
Chloride: 107 mEq/L (ref 96–112)
Creatinine, Ser: 0.4 mg/dL — ABNORMAL LOW (ref 0.50–1.10)
Creatinine, Ser: 0.4 mg/dL — ABNORMAL LOW (ref 0.50–1.10)
Creatinine, Ser: 0.5 mg/dL (ref 0.50–1.10)
Creatinine, Ser: 0.4 mg/dL — ABNORMAL LOW (ref 0.50–1.10)
Creatinine, Ser: 0.4 mg/dL — ABNORMAL LOW (ref 0.50–1.10)
Creatinine, Ser: 0.4 mg/dL — ABNORMAL LOW (ref 0.50–1.10)
Creatinine, Ser: 0.5 mg/dL (ref 0.50–1.10)
GLUCOSE: 98 mg/dL (ref 70–99)
GLUCOSE: 137 mg/dL — AB (ref 70–99)
GLUCOSE: 107 mg/dL — AB (ref 70–99)
Glucose, Bld: 105 mg/dL — ABNORMAL HIGH (ref 70–99)
Glucose, Bld: 93 mg/dL (ref 70–99)
Glucose, Bld: 109 mg/dL — ABNORMAL HIGH (ref 70–99)
Glucose, Bld: 101 mg/dL — ABNORMAL HIGH (ref 70–99)
Glucose, Bld: 148 mg/dL — ABNORMAL HIGH (ref 70–99)
HCT: 34 % — ABNORMAL LOW (ref 36.0–46.0)
HCT: 31 % — ABNORMAL LOW (ref 36.0–46.0)
HCT: 25 % — ABNORMAL LOW (ref 36.0–46.0)
HEMATOCRIT: 26 % — AB (ref 36.0–46.0)
HEMATOCRIT: 23 % — AB (ref 36.0–46.0)
HEMATOCRIT: 23 % — AB (ref 36.0–46.0)
HEMATOCRIT: 24 % — AB (ref 36.0–46.0)
HEMATOCRIT: 29 % — AB (ref 36.0–46.0)
HEMOGLOBIN: 11.6 g/dL — AB (ref 12.0–15.0)
HEMOGLOBIN: 10.5 g/dL — AB (ref 12.0–15.0)
HEMOGLOBIN: 8.5 g/dL — AB (ref 12.0–15.0)
HEMOGLOBIN: 8.2 g/dL — AB (ref 12.0–15.0)
HEMOGLOBIN: 9.9 g/dL — AB (ref 12.0–15.0)
Hemoglobin: 8.8 g/dL — ABNORMAL LOW (ref 12.0–15.0)
Hemoglobin: 7.8 g/dL — ABNORMAL LOW (ref 12.0–15.0)
Hemoglobin: 7.8 g/dL — ABNORMAL LOW (ref 12.0–15.0)
POTASSIUM: 3.2 meq/L — AB (ref 3.7–5.3)
POTASSIUM: 3.4 meq/L — AB (ref 3.7–5.3)
POTASSIUM: 4.6 meq/L (ref 3.7–5.3)
POTASSIUM: 3.9 meq/L (ref 3.7–5.3)
Potassium: 3.5 mEq/L — ABNORMAL LOW (ref 3.7–5.3)
Potassium: 4 mEq/L (ref 3.7–5.3)
Potassium: 4.3 mEq/L (ref 3.7–5.3)
Potassium: 4.6 mEq/L (ref 3.7–5.3)
SODIUM: 139 meq/L (ref 137–147)
SODIUM: 136 meq/L — AB (ref 137–147)
SODIUM: 137 meq/L (ref 137–147)
SODIUM: 137 meq/L (ref 137–147)
Sodium: 139 mEq/L (ref 137–147)
Sodium: 135 mEq/L — ABNORMAL LOW (ref 137–147)
Sodium: 133 mEq/L — ABNORMAL LOW (ref 137–147)
Sodium: 139 mEq/L (ref 137–147)
TCO2: 24 mmol/L (ref 0–100)
TCO2: 24 mmol/L (ref 0–100)
TCO2: 24 mmol/L (ref 0–100)
TCO2: 22 mmol/L (ref 0–100)
TCO2: 22 mmol/L (ref 0–100)
TCO2: 22 mmol/L (ref 0–100)
TCO2: 23 mmol/L (ref 0–100)
TCO2: 19 mmol/L (ref 0–100)

## 2014-09-03 LAB — PROTIME-INR
INR: 1.72 — ABNORMAL HIGH (ref 0.00–1.49)
Prothrombin Time: 20.3 seconds — ABNORMAL HIGH (ref 11.6–15.2)

## 2014-09-03 LAB — POCT I-STAT 4, (NA,K, GLUC, HGB,HCT)
Glucose, Bld: 72 mg/dL (ref 70–99)
HEMATOCRIT: 26 % — AB (ref 36.0–46.0)
Hemoglobin: 8.8 g/dL — ABNORMAL LOW (ref 12.0–15.0)
Potassium: 2.9 mEq/L — CL (ref 3.7–5.3)
Sodium: 141 mEq/L (ref 137–147)

## 2014-09-03 LAB — CBC
HCT: 28.4 % — ABNORMAL LOW (ref 36.0–46.0)
HEMATOCRIT: 29.5 % — AB (ref 36.0–46.0)
HEMOGLOBIN: 9.7 g/dL — AB (ref 12.0–15.0)
Hemoglobin: 10.1 g/dL — ABNORMAL LOW (ref 12.0–15.0)
MCH: 31 pg (ref 26.0–34.0)
MCH: 31 pg (ref 26.0–34.0)
MCHC: 34.2 g/dL (ref 30.0–36.0)
MCHC: 34.2 g/dL (ref 30.0–36.0)
MCV: 90.7 fL (ref 78.0–100.0)
MCV: 90.5 fL (ref 78.0–100.0)
Platelets: 123 10*3/uL — ABNORMAL LOW (ref 150–400)
Platelets: 123 10*3/uL — ABNORMAL LOW (ref 150–400)
RBC: 3.13 MIL/uL — ABNORMAL LOW (ref 3.87–5.11)
RBC: 3.26 MIL/uL — ABNORMAL LOW (ref 3.87–5.11)
RDW: 13.1 % (ref 11.5–15.5)
RDW: 13.1 % (ref 11.5–15.5)
WBC: 16.7 10*3/uL — ABNORMAL HIGH (ref 4.0–10.5)
WBC: 16.3 10*3/uL — ABNORMAL HIGH (ref 4.0–10.5)

## 2014-09-03 LAB — APTT: aPTT: 46 seconds — ABNORMAL HIGH (ref 24–37)

## 2014-09-03 LAB — HEMOGLOBIN AND HEMATOCRIT, BLOOD
HCT: 24.3 % — ABNORMAL LOW (ref 36.0–46.0)
Hemoglobin: 8.1 g/dL — ABNORMAL LOW (ref 12.0–15.0)

## 2014-09-03 LAB — PLATELET COUNT: Platelets: 144 10*3/uL — ABNORMAL LOW (ref 150–400)

## 2014-09-03 LAB — CREATININE, SERUM
Creatinine, Ser: 0.56 mg/dL (ref 0.50–1.10)
GFR calc Af Amer: >90 mL/min (ref >90)

## 2014-09-03 LAB — MAGNESIUM: MAGNESIUM: 3.1 mg/dL — AB (ref 1.5–2.5)

## 2014-09-03 SURGERY — ASCENDING AORTIC ROOT REPLACEMENT
Anesthesia: General | Site: Chest

## 2014-09-03 MED ORDER — 0.9 % SODIUM CHLORIDE (POUR BTL) OPTIME
TOPICAL | Status: DC | PRN
  Administered 2014-09-03: 1000 mL

## 2014-09-03 MED ORDER — INSULIN REGULAR BOLUS VIA INFUSION
0.0000 [IU] | Freq: Three times a day (TID) | INTRAVENOUS | Status: DC
  Filled 2014-09-03: qty 10

## 2014-09-03 MED ORDER — DEXTROSE 5 % IV SOLN
1.5000 g | Freq: Two times a day (BID) | INTRAVENOUS | Status: AC
  Administered 2014-09-03 – 2014-09-05 (×4): 1.5 g via INTRAVENOUS
  Filled 2014-09-03 (×5): qty 1.5

## 2014-09-03 MED ORDER — ROCURONIUM BROMIDE 100 MG/10ML IV SOLN
INTRAVENOUS | Status: DC | PRN
  Administered 2014-09-03: 50 mg via INTRAVENOUS

## 2014-09-03 MED ORDER — ASPIRIN EC 325 MG PO TBEC
325.0000 mg | DELAYED_RELEASE_TABLET | Freq: Every day | ORAL | Status: DC
  Administered 2014-09-04 – 2014-09-06 (×3): 325 mg via ORAL
  Filled 2014-09-03 (×3): qty 1

## 2014-09-03 MED ORDER — METOPROLOL TARTRATE 1 MG/ML IV SOLN: 2.5000 mg | INTRAVENOUS | Status: DC | PRN

## 2014-09-03 MED ORDER — OXYCODONE HCL 5 MG PO TABS
5.0000 mg | ORAL_TABLET | ORAL | Status: DC | PRN
  Administered 2014-09-04: 5 mg via ORAL
  Administered 2014-09-04 – 2014-09-05 (×7): 10 mg via ORAL
  Administered 2014-09-06: 5 mg via ORAL
  Filled 2014-09-03: qty 1
  Filled 2014-09-03 (×5): qty 2
  Filled 2014-09-03 (×2): qty 1
  Filled 2014-09-03 (×2): qty 2

## 2014-09-03 MED ORDER — SODIUM CHLORIDE 0.9 % IJ SOLN
3.0000 mL | Freq: Two times a day (BID) | INTRAMUSCULAR | Status: DC
  Administered 2014-09-04 – 2014-09-06 (×5): 3 mL via INTRAVENOUS

## 2014-09-03 MED ORDER — ARTIFICIAL TEARS OP OINT
TOPICAL_OINTMENT | OPHTHALMIC | Status: AC
  Filled 2014-09-03: qty 3.5

## 2014-09-03 MED ORDER — HEMOSTATIC AGENTS (NO CHARGE) OPTIME
TOPICAL | Status: DC | PRN
  Administered 2014-09-03 (×3): 1 via TOPICAL

## 2014-09-03 MED ORDER — MIDAZOLAM HCL 10 MG/2ML IJ SOLN
INTRAMUSCULAR | Status: AC
  Filled 2014-09-03: qty 2

## 2014-09-03 MED ORDER — SODIUM CHLORIDE 0.9 % IJ SOLN
INTRAMUSCULAR | Status: AC
  Filled 2014-09-03: qty 20

## 2014-09-03 MED ORDER — POTASSIUM CHLORIDE 10 MEQ/50ML IV SOLN
10.0000 meq | INTRAVENOUS | Status: AC
  Administered 2014-09-03 (×2): 10 meq via INTRAVENOUS

## 2014-09-03 MED ORDER — SUCCINYLCHOLINE CHLORIDE 20 MG/ML IJ SOLN
INTRAMUSCULAR | Status: AC
  Filled 2014-09-03: qty 1

## 2014-09-03 MED ORDER — BISACODYL 10 MG RE SUPP: 10.0000 mg | Freq: Every day | RECTAL | Status: DC

## 2014-09-03 MED ORDER — MIDAZOLAM HCL 2 MG/2ML IJ SOLN
2.0000 mg | INTRAMUSCULAR | Status: DC | PRN
  Administered 2014-09-04: 2 mg via INTRAVENOUS
  Filled 2014-09-03 (×2): qty 2

## 2014-09-03 MED ORDER — PROPOFOL 10 MG/ML IV BOLUS
INTRAVENOUS | Status: AC
  Filled 2014-09-03: qty 20

## 2014-09-03 MED ORDER — MIDAZOLAM HCL 5 MG/5ML IJ SOLN
INTRAMUSCULAR | Status: DC | PRN
  Administered 2014-09-03 (×2): 5 mg via INTRAVENOUS
  Administered 2014-09-03 (×3): 1 mg via INTRAVENOUS
  Administered 2014-09-03: 2 mg via INTRAVENOUS

## 2014-09-03 MED ORDER — ACETAMINOPHEN 650 MG RE SUPP
650.0000 mg | Freq: Once | RECTAL | Status: AC
  Administered 2014-09-03: 650 mg via RECTAL

## 2014-09-03 MED ORDER — THROMBIN 20000 UNITS EX SOLR
CUTANEOUS | Status: AC
  Filled 2014-09-03: qty 20000

## 2014-09-03 MED ORDER — FENTANYL CITRATE 0.05 MG/ML IJ SOLN
INTRAMUSCULAR | Status: AC
  Filled 2014-09-03: qty 5

## 2014-09-03 MED ORDER — METOPROLOL TARTRATE 25 MG/10 ML ORAL SUSPENSION
12.5000 mg | Freq: Two times a day (BID) | ORAL | Status: DC
  Filled 2014-09-03: qty 5

## 2014-09-03 MED ORDER — MAGNESIUM SULFATE 4 GM/100ML IV SOLN
4.0000 g | Freq: Once | INTRAVENOUS | Status: AC
  Administered 2014-09-03: 4 g via INTRAVENOUS
  Filled 2014-09-03: qty 100

## 2014-09-03 MED ORDER — THROMBIN 20000 UNITS EX SOLR
CUTANEOUS | Status: DC | PRN
  Administered 2014-09-03: 20000 [IU] via TOPICAL

## 2014-09-03 MED ORDER — METOCLOPRAMIDE HCL 5 MG/ML IJ SOLN
10.0000 mg | Freq: Four times a day (QID) | INTRAMUSCULAR | Status: AC
  Administered 2014-09-03 – 2014-09-04 (×3): 10 mg via INTRAVENOUS
  Filled 2014-09-03 (×4): qty 2

## 2014-09-03 MED ORDER — DOCUSATE SODIUM 100 MG PO CAPS
200.0000 mg | ORAL_CAPSULE | Freq: Every day | ORAL | Status: DC
  Administered 2014-09-04 – 2014-09-06 (×3): 200 mg via ORAL
  Filled 2014-09-03 (×3): qty 2

## 2014-09-03 MED ORDER — ALBUMIN HUMAN 5 % IV SOLN
INTRAVENOUS | Status: DC | PRN
  Administered 2014-09-03: 13:00:00 via INTRAVENOUS

## 2014-09-03 MED ORDER — HEPARIN SODIUM (PORCINE) 1000 UNIT/ML IJ SOLN
INTRAMUSCULAR | Status: DC | PRN
  Administered 2014-09-03: 10000 [IU] via INTRAVENOUS
  Administered 2014-09-03: 12000 [IU] via INTRAVENOUS

## 2014-09-03 MED ORDER — THROMBIN 20000 UNITS EX SOLR
OROMUCOSAL | Status: DC | PRN
  Administered 2014-09-03: 4 mL via TOPICAL

## 2014-09-03 MED ORDER — DEXMEDETOMIDINE HCL IN NACL 200 MCG/50ML IV SOLN: 0.0000 ug/kg/h | INTRAVENOUS | Status: DC

## 2014-09-03 MED ORDER — LACTATED RINGERS IV SOLN: 500.0000 mL | Freq: Once | INTRAVENOUS | Status: AC | PRN

## 2014-09-03 MED ORDER — MORPHINE SULFATE 2 MG/ML IJ SOLN
1.0000 mg | INTRAMUSCULAR | Status: AC | PRN
  Administered 2014-09-03 (×3): 2 mg via INTRAVENOUS
  Administered 2014-09-03: 1 mg via INTRAVENOUS
  Administered 2014-09-04: 4 mg via INTRAVENOUS
  Filled 2014-09-03 (×2): qty 1
  Filled 2014-09-03: qty 2
  Filled 2014-09-03 (×2): qty 1

## 2014-09-03 MED ORDER — MORPHINE SULFATE 2 MG/ML IJ SOLN
2.0000 mg | INTRAMUSCULAR | Status: DC | PRN
  Administered 2014-09-04: 2 mg via INTRAVENOUS
  Administered 2014-09-04 (×2): 4 mg via INTRAVENOUS
  Administered 2014-09-04 – 2014-09-05 (×2): 2 mg via INTRAVENOUS
  Administered 2014-09-05: 5 mg via INTRAVENOUS
  Filled 2014-09-03: qty 1
  Filled 2014-09-03: qty 2
  Filled 2014-09-03 (×2): qty 1
  Filled 2014-09-03: qty 2
  Filled 2014-09-03: qty 3

## 2014-09-03 MED ORDER — BISACODYL 5 MG PO TBEC
10.0000 mg | DELAYED_RELEASE_TABLET | Freq: Every day | ORAL | Status: DC
  Administered 2014-09-04 – 2014-09-05 (×2): 10 mg via ORAL
  Filled 2014-09-03 (×3): qty 2

## 2014-09-03 MED ORDER — SODIUM CHLORIDE 0.9 % IV SOLN
INTRAVENOUS | Status: DC
  Administered 2014-09-03: 1.9 [IU]/h via INTRAVENOUS
  Filled 2014-09-03 (×2): qty 2.5

## 2014-09-03 MED ORDER — PANTOPRAZOLE SODIUM 40 MG PO TBEC
40.0000 mg | DELAYED_RELEASE_TABLET | Freq: Every day | ORAL | Status: DC
  Administered 2014-09-05 – 2014-09-06 (×2): 40 mg via ORAL
  Filled 2014-09-03 (×2): qty 1

## 2014-09-03 MED ORDER — SODIUM CHLORIDE 0.45 % IV SOLN
INTRAVENOUS | Status: DC
  Administered 2014-09-03: 20 mL/h via INTRAVENOUS

## 2014-09-03 MED ORDER — SODIUM CHLORIDE 0.9 % IV SOLN
INTRAVENOUS | Status: DC | PRN
  Administered 2014-09-03: 13:00:00 via INTRAVENOUS

## 2014-09-03 MED ORDER — GLYCOPYRROLATE 0.2 MG/ML IJ SOLN
INTRAMUSCULAR | Status: AC
  Filled 2014-09-03: qty 1

## 2014-09-03 MED ORDER — BUPROPION HCL ER (XL) 300 MG PO TB24
450.0000 mg | ORAL_TABLET | Freq: Every day | ORAL | Status: DC
  Administered 2014-09-04 – 2014-09-08 (×5): 450 mg via ORAL
  Filled 2014-09-03 (×5): qty 1

## 2014-09-03 MED ORDER — TRAMADOL HCL 50 MG PO TABS
50.0000 mg | ORAL_TABLET | ORAL | Status: DC | PRN
  Administered 2014-09-04: 50 mg via ORAL
  Filled 2014-09-03: qty 1

## 2014-09-03 MED ORDER — SODIUM CHLORIDE 0.9 % IJ SOLN: 3.0000 mL | INTRAMUSCULAR | Status: DC | PRN

## 2014-09-03 MED ORDER — VANCOMYCIN HCL IN DEXTROSE 1-5 GM/200ML-% IV SOLN
1000.0000 mg | Freq: Once | INTRAVENOUS | Status: AC
  Administered 2014-09-03: 1000 mg via INTRAVENOUS
  Filled 2014-09-03: qty 200

## 2014-09-03 MED ORDER — METOPROLOL TARTRATE 12.5 MG HALF TABLET
12.5000 mg | ORAL_TABLET | Freq: Two times a day (BID) | ORAL | Status: DC
  Filled 2014-09-03: qty 1

## 2014-09-03 MED ORDER — LIDOCAINE HCL (CARDIAC) 20 MG/ML IV SOLN
INTRAVENOUS | Status: DC | PRN
  Administered 2014-09-03: 40 mg via INTRAVENOUS

## 2014-09-03 MED ORDER — VECURONIUM BROMIDE 10 MG IV SOLR
INTRAVENOUS | Status: AC
  Filled 2014-09-03: qty 20

## 2014-09-03 MED ORDER — ACETAMINOPHEN 160 MG/5ML PO SOLN: 650.0000 mg | Freq: Once | ORAL | Status: AC

## 2014-09-03 MED ORDER — PROPOFOL 10 MG/ML IV BOLUS
INTRAVENOUS | Status: DC | PRN
  Administered 2014-09-03: 50 mg via INTRAVENOUS
  Administered 2014-09-03: 150 mg via INTRAVENOUS

## 2014-09-03 MED ORDER — SODIUM CHLORIDE 0.9 % IV SOLN: 250.0000 mL | INTRAVENOUS | Status: DC

## 2014-09-03 MED ORDER — ALBUMIN HUMAN 5 % IV SOLN: 250.0000 mL | INTRAVENOUS | Status: AC | PRN

## 2014-09-03 MED ORDER — ROCURONIUM BROMIDE 50 MG/5ML IV SOLN
INTRAVENOUS | Status: AC
  Filled 2014-09-03: qty 1

## 2014-09-03 MED ORDER — PHENYLEPHRINE HCL 10 MG/ML IJ SOLN
INTRAMUSCULAR | Status: DC | PRN
  Administered 2014-09-03: 40 ug via INTRAVENOUS

## 2014-09-03 MED ORDER — ACETAMINOPHEN 160 MG/5ML PO SOLN
1000.0000 mg | Freq: Four times a day (QID) | ORAL | Status: DC
  Filled 2014-09-03: qty 40

## 2014-09-03 MED ORDER — METHYLPREDNISOLONE SODIUM SUCC 125 MG IJ SOLR
INTRAMUSCULAR | Status: DC | PRN
  Administered 2014-09-03: 125 mg via INTRAVENOUS

## 2014-09-03 MED ORDER — LACTATED RINGERS IV SOLN
INTRAVENOUS | Status: DC | PRN
  Administered 2014-09-03 (×6): via INTRAVENOUS

## 2014-09-03 MED ORDER — ACETAMINOPHEN 500 MG PO TABS
1000.0000 mg | ORAL_TABLET | Freq: Four times a day (QID) | ORAL | Status: DC
  Administered 2014-09-04 – 2014-09-05 (×6): 1000 mg via ORAL
  Filled 2014-09-03 (×15): qty 2

## 2014-09-03 MED ORDER — SODIUM CHLORIDE 0.9 % IV SOLN
INTRAVENOUS | Status: DC
  Administered 2014-09-03: 20 mL/h via INTRAVENOUS

## 2014-09-03 MED ORDER — LACTATED RINGERS IV SOLN: INTRAVENOUS | Status: DC

## 2014-09-03 MED ORDER — DEXTROSE 5 % IV SOLN
0.0000 ug/min | INTRAVENOUS | Status: DC
  Administered 2014-09-05: 12 ug/min via INTRAVENOUS
  Filled 2014-09-03 (×3): qty 2

## 2014-09-03 MED ORDER — FENTANYL CITRATE 0.05 MG/ML IJ SOLN
INTRAMUSCULAR | Status: DC | PRN
  Administered 2014-09-03: 100 ug via INTRAVENOUS
  Administered 2014-09-03: 150 ug via INTRAVENOUS
  Administered 2014-09-03: 200 ug via INTRAVENOUS
  Administered 2014-09-03: 150 ug via INTRAVENOUS
  Administered 2014-09-03: 100 ug via INTRAVENOUS
  Administered 2014-09-03: 50 ug via INTRAVENOUS
  Administered 2014-09-03: 350 ug via INTRAVENOUS
  Administered 2014-09-03: 150 ug via INTRAVENOUS

## 2014-09-03 MED ORDER — NITROGLYCERIN IN D5W 200-5 MCG/ML-% IV SOLN: 0.0000 ug/min | INTRAVENOUS | Status: DC

## 2014-09-03 MED ORDER — POTASSIUM CHLORIDE 10 MEQ/50ML IV SOLN
10.0000 meq | INTRAVENOUS | Status: AC
  Administered 2014-09-03 (×3): 10 meq via INTRAVENOUS

## 2014-09-03 MED ORDER — CHLORHEXIDINE GLUCONATE 0.12 % MT SOLN
15.0000 mL | Freq: Once | OROMUCOSAL | Status: AC
  Administered 2014-09-03: 15 mL via OROMUCOSAL
  Filled 2014-09-03: qty 15

## 2014-09-03 MED ORDER — EPHEDRINE SULFATE 50 MG/ML IJ SOLN
INTRAMUSCULAR | Status: AC
  Filled 2014-09-03: qty 1

## 2014-09-03 MED ORDER — BUPROPION HCL ER (XL) 450 MG PO TB24: 450.0000 mg | ORAL_TABLET | Freq: Every day | ORAL | Status: DC

## 2014-09-03 MED ORDER — ASPIRIN 81 MG PO CHEW: 324.0000 mg | CHEWABLE_TABLET | Freq: Every day | ORAL | Status: DC

## 2014-09-03 MED ORDER — VECURONIUM BROMIDE 10 MG IV SOLR
INTRAVENOUS | Status: DC | PRN
  Administered 2014-09-03 (×3): 5 mg via INTRAVENOUS

## 2014-09-03 MED ORDER — FAMOTIDINE IN NACL 20-0.9 MG/50ML-% IV SOLN
20.0000 mg | Freq: Two times a day (BID) | INTRAVENOUS | Status: DC
  Administered 2014-09-03: 20 mg via INTRAVENOUS

## 2014-09-03 MED ORDER — ONDANSETRON HCL 4 MG/2ML IJ SOLN
4.0000 mg | Freq: Four times a day (QID) | INTRAMUSCULAR | Status: DC | PRN
  Administered 2014-09-03 – 2014-09-05 (×4): 4 mg via INTRAVENOUS
  Filled 2014-09-03 (×5): qty 2

## 2014-09-03 MED FILL — Mannitol IV Soln 20%: INTRAVENOUS | Qty: 500 | Status: AC

## 2014-09-03 MED FILL — Sodium Bicarbonate IV Soln 8.4%: INTRAVENOUS | Qty: 50 | Status: AC

## 2014-09-03 MED FILL — Lidocaine HCl IV Inj 20 MG/ML: INTRAVENOUS | Qty: 5 | Status: AC

## 2014-09-03 MED FILL — Heparin Sodium (Porcine) Inj 1000 Unit/ML: INTRAMUSCULAR | Qty: 10 | Status: AC

## 2014-09-03 MED FILL — Sodium Chloride IV Soln 0.9%: INTRAVENOUS | Qty: 2000 | Status: AC

## 2014-09-03 MED FILL — Electrolyte-R (PH 7.4) Solution: INTRAVENOUS | Qty: 4000 | Status: AC

## 2014-09-03 SURGICAL SUPPLY — 93 items
ADAPTER CARDIO PERF ANTE/RETRO (ADAPTER) ×4 IMPLANT
ADPR PRFSN 84XANTGRD RTRGD (ADAPTER) ×2
APL SRG 7X2 LUM MLBL SLNT (VASCULAR PRODUCTS) ×4
APPLICATOR TIP COSEAL (VASCULAR PRODUCTS) ×4 IMPLANT
ATTRACTOMAT 16X20 MAGNETIC DRP (DRAPES) ×4 IMPLANT
BAG DECANTER FOR FLEXI CONT (MISCELLANEOUS) ×2 IMPLANT
BLADE STERNUM SYSTEM 6 (BLADE) ×4 IMPLANT
BLADE SURG 15 STRL LF DISP TIS (BLADE) ×2 IMPLANT
BLADE SURG 15 STRL SS (BLADE) ×4
CANISTER SUCTION 2500CC (MISCELLANEOUS) ×4 IMPLANT
CANNULA GUNDRY RCSP 15FR (MISCELLANEOUS) ×6 IMPLANT
CATH HEART VENT LEFT (CATHETERS) IMPLANT
CATH ROBINSON RED A/P 18FR (CATHETERS) ×14 IMPLANT
CATH THORACIC 36FR (CATHETERS) ×4 IMPLANT
CATH THORACIC 36FR RT ANG (CATHETERS) ×4 IMPLANT
CAUTERY HIGH TEMP VAS (MISCELLANEOUS) ×2 IMPLANT
CAUTERY SURG HI TEMP FINE TIP (MISCELLANEOUS) ×2 IMPLANT
CLIP TI WIDE RED SMALL 24 (CLIP) ×2 IMPLANT
CLOSURE WOUND 1/2 X4 (GAUZE/BANDAGES/DRESSINGS) ×1
CONT SPEC STER OR (MISCELLANEOUS) ×4 IMPLANT
COVER SURGICAL LIGHT HANDLE (MISCELLANEOUS) ×4 IMPLANT
CRADLE DONUT ADULT HEAD (MISCELLANEOUS) ×4 IMPLANT
DRAPE SLUSH/WARMER DISC (DRAPES) ×2 IMPLANT
DRSG COVADERM 4X14 (GAUZE/BANDAGES/DRESSINGS) ×4 IMPLANT
ELECT CAUTERY BLADE 6.4 (BLADE) ×4 IMPLANT
ELECT REM PT RETURN 9FT ADLT (ELECTROSURGICAL) ×8
ELECTRODE REM PT RTRN 9FT ADLT (ELECTROSURGICAL) ×4 IMPLANT
GAUZE SPONGE 4X4 12PLY STRL (GAUZE/BANDAGES/DRESSINGS) ×4 IMPLANT
GLOVE BIO SURGEON STRL SZ 6 (GLOVE) ×12 IMPLANT
GLOVE BIO SURGEON STRL SZ 6.5 (GLOVE) ×2 IMPLANT
GLOVE BIO SURGEON STRL SZ7 (GLOVE) IMPLANT
GLOVE BIO SURGEON STRL SZ7.5 (GLOVE) IMPLANT
GLOVE BIO SURGEONS STRL SZ 6.5 (GLOVE) ×2
GLOVE EUDERMIC 7 POWDERFREE (GLOVE) ×8 IMPLANT
GOWN STRL REUS W/ TWL LRG LVL3 (GOWN DISPOSABLE) ×8 IMPLANT
GOWN STRL REUS W/ TWL XL LVL3 (GOWN DISPOSABLE) ×2 IMPLANT
GOWN STRL REUS W/TWL LRG LVL3 (GOWN DISPOSABLE) ×40
GOWN STRL REUS W/TWL XL LVL3 (GOWN DISPOSABLE) ×4
GRAFT GELWEAVE VALSALVA 28 (Prosthesis & Implant Heart) IMPLANT
GRAFT GELWEAVE VALSALVA 28CM (Prosthesis & Implant Heart) ×4 IMPLANT
GRAFT HEMASHIELD 28X40 (Vascular Products) ×4 IMPLANT
GRAFT HEMASHIELD 28X50 (Vascular Products) IMPLANT
HEART VENT LT CURVED (MISCELLANEOUS) ×4 IMPLANT
HEMOSTAT POWDER SURGIFOAM 1G (HEMOSTASIS) ×12 IMPLANT
HEMOSTAT SURGICEL 2X14 (HEMOSTASIS) ×4 IMPLANT
INSERT FOGARTY XLG (MISCELLANEOUS) ×2 IMPLANT
KIT BASIN OR (CUSTOM PROCEDURE TRAY) ×4 IMPLANT
KIT CATH CPB BARTLE (MISCELLANEOUS) ×4 IMPLANT
KIT ROOM TURNOVER OR (KITS) ×4 IMPLANT
KIT SUCTION CATH 14FR (SUCTIONS) ×4 IMPLANT
LINE VENT (MISCELLANEOUS) ×2 IMPLANT
LOOP VESSEL SUPERMAXI WHITE (MISCELLANEOUS) ×2 IMPLANT
NS IRRIG 1000ML POUR BTL (IV SOLUTION) ×22 IMPLANT
PACK OPEN HEART (CUSTOM PROCEDURE TRAY) ×4 IMPLANT
PAD ARMBOARD 7.5X6 YLW CONV (MISCELLANEOUS) ×8 IMPLANT
SEALANT SURG COSEAL 8ML (VASCULAR PRODUCTS) ×4 IMPLANT
SET CARDIOPLEGIA MPS 5001102 (MISCELLANEOUS) ×2 IMPLANT
SPONGE GAUZE 4X4 12PLY STER LF (GAUZE/BANDAGES/DRESSINGS) ×2 IMPLANT
STRIP CLOSURE SKIN 1/2X4 (GAUZE/BANDAGES/DRESSINGS) ×1 IMPLANT
SUCKER INTRACARDIAC WEIGHTED (SUCKER) ×2 IMPLANT
SUT BONE WAX W31G (SUTURE) ×4 IMPLANT
SUT ETHIBON 2 0 V 52N 30 (SUTURE) ×10 IMPLANT
SUT ETHIBON EXCEL 2-0 V-5 (SUTURE) IMPLANT
SUT ETHIBOND 2 0 SH (SUTURE) ×4
SUT ETHIBOND 2 0 SH 36X2 (SUTURE) IMPLANT
SUT ETHIBOND V-5 VALVE (SUTURE) IMPLANT
SUT PROLENE 3 0 SH 1 (SUTURE) ×6 IMPLANT
SUT PROLENE 3 0 SH 48 (SUTURE) ×2 IMPLANT
SUT PROLENE 3 0 SH DA (SUTURE) IMPLANT
SUT PROLENE 4 0 RB 1 (SUTURE) ×52
SUT PROLENE 4-0 RB1 .5 CRCL 36 (SUTURE) ×8 IMPLANT
SUT PROLENE 5 0 C 1 36 (SUTURE) ×4 IMPLANT
SUT PROLENE 5 0 RB 2 (SUTURE) ×8 IMPLANT
SUT STEEL 6MS V (SUTURE) ×4 IMPLANT
SUT STEEL STERNAL CCS#1 18IN (SUTURE) IMPLANT
SUT STEEL SZ 6 DBL 3X14 BALL (SUTURE) IMPLANT
SUT VIC AB 1 CTX 36 (SUTURE) ×8
SUT VIC AB 1 CTX36XBRD ANBCTR (SUTURE) ×4 IMPLANT
SUT VIC AB 2-0 CT1 27 (SUTURE)
SUT VIC AB 2-0 CT1 TAPERPNT 27 (SUTURE) IMPLANT
SUT VIC AB 3-0 X1 27 (SUTURE) IMPLANT
SUTURE E-PAK OPEN HEART (SUTURE) ×4 IMPLANT
SYSTEM SAHARA CHEST DRAIN ATS (WOUND CARE) ×4 IMPLANT
TAPE CLOTH SURG 4X10 WHT LF (GAUZE/BANDAGES/DRESSINGS) ×2 IMPLANT
TAPE PAPER 2X10 WHT MICROPORE (GAUZE/BANDAGES/DRESSINGS) ×2 IMPLANT
TOWEL OR 17X24 6PK STRL BLUE (TOWEL DISPOSABLE) ×4 IMPLANT
TOWEL OR 17X26 10 PK STRL BLUE (TOWEL DISPOSABLE) ×4 IMPLANT
TRAY FOLEY IC TEMP SENS 14FR (CATHETERS) ×4 IMPLANT
TUBING BULK SUCTION (MISCELLANEOUS) ×2 IMPLANT
UNDERPAD 30X30 INCONTINENT (UNDERPADS AND DIAPERS) ×4 IMPLANT
VENT LEFT HEART 12002 (CATHETERS) ×4
WATER STERILE IRR 1000ML POUR (IV SOLUTION) ×8 IMPLANT
YANKAUER SUCT BULB TIP NO VENT (SUCTIONS) ×2 IMPLANT

## 2014-09-03 NOTE — Anesthesia Procedure Notes (Addendum)
Procedures   The patient was identified and consent obtained.  TO was performed, and full barrier precautions were used.  The skin was anesthetized with lidocaine.  Once the vein was located with the 22 ga. needle using ultrasound guidance , the wire was inserted into the vein.  The wire location was confirmed with ultrasound.  The insertion site was dilated and the introducer was carefully inserted and sutured in place. The PAC was checked, and floated into the PA.  Once in the PA, the catheter was secured. The patient tolerated the procedure well.  CXR was ordered for PACU. Start: 0645 End: 0658 J. Claybon Jabsaniel Coreen Shippee, MD

## 2014-09-03 NOTE — Brief Op Note (Signed)
      [address]       [place] [id number]             [phone]     09/03/2014  12:47 PM  PATIENT:  Elizabeth StanleyMelissa M Hayden  43 y.o. female  PRE-OPERATIVE DIAGNOSIS:  ASCENDING AORTIC ANEURYSM   POST-OPERATIVE DIAGNOSIS:  ASCENDING AORTIC ANEURYSM   PROCEDURE:  Procedure(s):Aortic Valve  Procedure Performed:  Replacement: No.  Repair/Reconstruction: No.   Aortic Annular Enlargement: No.    Aortic Valve Etiology   Aortic Insufficiency:  Trivial/Trace  Aortic Valve Disease:  Yes.  Aortic Stenosis:  No.  Etiology (Choose at least one and up to  5 etiologies):  Bicuspid valve disease and OtherASCENDING AORTIC ANEURYSM PREVIOUS COARCTATION REPAIR   ASCENDING AORTIC ROOT REPLACEMENT & VALVE SPARING ROOT INTRAOPERATIVE TRANSESOPHAGEAL ECHOCARDIOGRAM  SURGEON:  Surgeon(s): Alleen BorneBryan K Bartle, MD  PHYSICIAN ASSISTANT: Sylvanus Telford PA-C  ANESTHESIA:   general  PATIENT CONDITION:  ICU - intubated and hemodynamically stable.  PRE-OPERATIVE WEIGHT: 64kg  EBL: SEE ANEST/PERFUSION RECORDS  COMPLICATIONS : NO KNOWN

## 2014-09-03 NOTE — H&P (Signed)
[address]       [name] [address]             [phone]      Cardiothoracic Surgery History and Physical   PCP is Roxy Manns, MD Referring Provider is Wendall Stade, MD  Chief Complaint  Patient presents with  . NEW CARDIAC    ASCENDING ANEURYSM    HPI:  The patient is a 43 year old woman with a history of bicuspid aortic valve, coarctation repair at age 1 through a left thoracotomy, and known aortic root and ascending aortic dilation. A 2D echo in 09/2011 showed an aortic root diameter of 42 mm, STJ diameter of 40 mm, and ascending aortic diameter of 49 mm. An MRA in 10/2011 showed the diameter of the ascending aorta at the level of the LPA to be 45 mm compared to the descending aorta that was 23 mm. A recent MRA on 07/23/2014 shows the ascending aorta to be 51 mm. The left subclavian artery has its origin from the descending aorta. The aorta in the area of the DA is narrowed to 17 mm but there is no gradient. Cardiac MR shows a bicuspid valve with no significant stenosis or regurgitation and normal EF of 71%. She is doing well and denies any shortness of breath. She has occassional chest discomfort that is vague. She has a history of chronic generalized edema of unclear etiology but is no longer on lasix for that and it has not been a problem lately.  Past Medical History  Diagnosis Date  . Allergy   . Depression   . TMJ (dislocation of temporomandibular joint)     Wears a night gear   . Congenital heart disease     bicuspid aortic valve with coarctation repair 04/1974  . Aortic root dilation   . GERD (gastroesophageal reflux disease)   . Edema   . Fibrocystic breast   . HPV (human papilloma virus) infection     Past Surgical History  Procedure Laterality Date  . Coarctation of aorta excision  1975  . Eye surgery    . Tee without cardioversion N/A 08/06/2014    Procedure:  TRANSESOPHAGEAL ECHOCARDIOGRAM (TEE); Surgeon: Wendall Stade, MD; Location: [facility] ENDOSCOPY; Service: Cardiovascular; Laterality: N/A;    Family History  Problem Relation Age of Onset  . Hypertension Father   . Diabetes Father   . Cancer Father     lung   . Breast cancer Maternal Grandmother   . Leukemia Maternal Grandmother   . Ovarian cancer Paternal Grandmother     Social History History  Substance Use Topics  . Smoking status: Never Smoker   . Smokeless tobacco: Not on file  . Alcohol Use: Yes     Comment: occasional    Current Outpatient Prescriptions  Medication Sig Dispense Refill  . CALCIUM-VITAMIN D PO Take 2 tablets by mouth daily.     . FORFIVO XL 450 MG TB24 Take 1 tablet by mouth daily 90 tablet 1  . Multiple Vitamin (MULTIVITAMIN) capsule Take 1 capsule by mouth daily.     . ranitidine (ZANTAC) 75 MG tablet Take 150 mg by mouth daily.     No current facility-administered medications for this visit.    Allergies  Allergen Reactions  . Ace Inhibitors     REACTION: rash and cough    Review of Systems  Constitutional: Negative for fever, chills, diaphoresis, fatigue and unexpected weight change.  Not physically active.  HENT: Negative.  Eyes: Negative.  Respiratory: Negative.  Cardiovascular: Positive for leg swelling.   Occassional chest pain that is not exertional  Gastrointestinal: Negative.  Endocrine: Negative.  Genitourinary: Negative.  Musculoskeletal: Negative.  Skin: Negative.  Allergic/Immunologic: Negative.  Neurological: Negative.  Hematological: Negative.  Psychiatric/Behavioral:   Depression    BP 148/99 mmHg  Pulse 81  Ht 5\' 8"  (1.727 m)  Wt 148 lb (67.132 kg)  BMI 22.51 kg/m2  SpO2 97%  LMP 07/12/2014 Physical Exam  Constitutional: She is oriented to person, place, and time. She appears well-developed and  well-nourished. No distress.  HENT:  Head: Normocephalic and atraumatic.  Mouth/Throat: Oropharynx is clear and moist.  Eyes: EOM are normal. Pupils are equal, round, and reactive to light.  Neck: Normal range of motion. Neck supple. No JVD present. No tracheal deviation present. No thyromegaly present.  Cardiovascular: Normal rate, regular rhythm, normal heart sounds and intact distal pulses.  1/6 systolic murmur over aorta  Pulmonary/Chest: Effort normal and breath sounds normal. No respiratory distress. She has no wheezes. She has no rales.  Left posterolateral thoracotomy scar  Abdominal: Soft. Bowel sounds are normal. She exhibits no distension and no mass. There is no tenderness.  Musculoskeletal: Normal range of motion. She exhibits no edema or tenderness.  Lymphadenopathy:   She has no cervical adenopathy.  Neurological: She is alert and oriented to person, place, and time. She has normal strength. No cranial nerve deficit or sensory deficit.  Skin: Skin is warm and dry.  Psychiatric: She has a normal mood and affect.     Diagnostic Tests:   CLINICAL DATA: Bicuspid Aortic Valve and Aortic Aneurysm  EXAM: CARDIAC MRI  TECHNIQUE: The patient was scanned on a 1.5 Tesla GE magnet. A dedicated cardiac coil was used. Functional imaging was done using Fiesta sequences. 2,3, and 4 chamber views were done to assess for RWMA's. Modified Simpson's rule using a short axis stack was used to calculate an ejection fraction on a dedicated work Research officer, trade unionstation using Circle software. The patient received 15 cc of Multihance. After 10 minutes inversion recovery sequences were used to assess for infiltration and scar tissue.  CONTRAST: 15 cc multihance  FINDINGS: All 4 cardiac chambers were normal in size and function. There was no ASD/VSD. The mitral and tricuspid valves appeared normal. The aortic valve was bicuspid. There did not appear to be any significant stenosis or  regurgitation. The quantitative EF was 71% (EDV 85 cc ESV 25 cc SV 60 cc) The ascending aorta was enlarged This started at the level of the sinus of valsalva but was largest in cross sectional diameter at the right MPA By IIR axial black blood imaging and, MRA the maximum diameter was 5.0 cm There was no evidence of dissection or penetrating hematoma  Aortic MRA: Aortic MRA confirms severe ascending aortic root dilatation. 5.0 cm. There was an aberrant left subclavian take off from the descending thoracic aorta. The patient has had a previous coarctation repair. There is some re narrowing in the area of the PDA with minimal luminal diameter of 1.5 cm and mild post stenotic dilatation at 2.3 cm There is no significant gradient.  IMPRESSION: 1) Increase in size of ascending aortic aneurysm 5.0 cm compared to 11/03/11 where it measured 4.5 cm.  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  On: 07/23/2014 18:49     CLINICAL DATA: AORTIC DILITATION BICUSPID AORTIC VALVE COART REPAIR  EXAM: MRA CHEST WITH OR WITHOUT CONTRAST  TECHNIQUE: Angiographic images of the chest were obtained using MRA technique without and with intravenous contrast.  CONTRAST: 24mL MULTIHANCE GADOBENATE DIMEGLUMINE 529 MG/ML IV SOLN  COMPARISON: 11/03/2011 and earlier studies  FINDINGS: The thoracic aorta measures 4.2 cm maximum transverse diameter at the level of the sinuses of Valsalva, 5.1 cm mid ascending, 3.6 cm distal ascending/ proximal arch, 2.2 cm distal arch, 2.6 cm proximal descending, with mild tapered narrowing to 17 mm CT in its proximal segment, returned to a diameter of 2.4  cm in the mid descending, tapering down to 2.2 cm in the distal descending just above the diaphragm. Origins of the right brachiocephalic and left common carotid arteries widely patent. There is a distal origin of the left subclavian artery with a broad infundibulum, widely patent. Mild irregularity and narrowing of the descending thoracic aorta just distal to the subclavian origin as above. No dissection or hemodynamically significant stenosis.  No pleural or pericardial effusion. No hilar or mediastinal adenopathy identified. Visualized segments of upper abdomen unremarkable. Multiple fluid signal lesions in bilateral breasts, incompletely characterized. Small mid thoracic disc protrusion is noted without spinal stenosis.  Cardiac morphology segment of the examination will be dictated separately by supervising cardiologist.  IMPRESSION: 1. Minimal increase in size of 5.1 cm ascending aortic aneurysm. 2. Distal left subclavian artery origin with persistent mild tapered narrowing of the proximal descending aorta, 17 mm diameter.   Electronically Signed  By: Oley Balmaniel Hassell M.D.  On: 07/24/2014 10:13    TEE: Indication Bicuspid Aortic valve Aortic root aneurysm 6 mg versed and 50ug fentanyl  Severe aortic root enlargement 4.9 cm. Bicuspid Aortic valve with no significant AS/AR suitable for valve sparing EF normal 60% Normal mitral , pulmonary and tricuspid valves. No ASD or effusion. Could not visualize area Of coarctation repair Minimal diameter of descending thoracic aorta 2.0 cm.   Impression:  Bicuspid AV with no AS/AR suitable for valve sparing Severe aortic root enlargement 4.9 cm Normal MV, TV, PV EF 60% No effusion No ASD  Charlton HawsPeter Nishan          Cardiac Catheterization Procedure Note  Name: Theone StanleyMelissa M Turrubiates MRN: [id number] DOB: 04/04/1971  Procedure: Left Heart Cath, Selective Coronary Angiography, LV angiography  Indication:  Bicuspid aortic valve , Coarctation Pre surgical for Aortic Root aneurysm  Procedural details: The right groin was prepped, draped, and anesthetized with 1% lidocaine. Using modified Seldinger technique, a 5 French sheath was introduced into the right femoral artery. Standard Judkins catheters were used for coronary angiography and left ventriculography. Catheter exchanges were performed over a guidewire. There were no immediate procedural complications. The patient was transferred to the post catheterization recovery area for further monitoring.  Procedural Findings: Hemodynamics:  AO 123 65  LV   Coronary angiography: Coronary dominance: right  Left mainstem: Normal  Left anterior descending (LAD): Normal  D1: normal D2: normal D3: normal  Left circumflex (LCx): Left dominant normal  OM1: normal OM2: normal PLD/PLA: Multiple small branches normal  Right coronary artery (RCA): Nondominant normal  Aortograpy: Severe aortic root enlargement no disection Mild narrowing at sight of previous coarctation repair with no gradient   Final Conclusions: No CAD  Recommendations: Patient has  a 5.0 cm aortic root that has increased in size. Refer to Dr Laneta Simmers for valve sparing aortic root replacement. No gradient across coarctation repair with MLD by MRI over 1.5 cm   Charlton Haws 08/06/2014, 10:38 AM       Impression:  She has an aortic root and ascending aortic aneurysm with a maximum diameter in the mid ascending aorta of 5.1 cm. This has increased from 4.5 cm in 2013. She has a bicuspid aortic valve that structurally looks good with no stenosis or regurgitation and looks suitable for a valve sparing aortic replacement. Her aneurysm is at a size that should be replaced especially with a bicuspid valve in young person. I discussed the very small chance of having to  replace her valve and the recommendation for a mechanical valve given her age. She understands that she would have to be on lifelong coumadin and is agreement with that. She is not planning any further pregnancies. I discussed the operative procedure with the patient and family including alternatives, benefits and risks; including but not limited to bleeding, blood transfusion, infection, stroke, myocardial infarction, graft failure, heart block requiring a permanent pacemaker, organ dysfunction, and death. Theone Stanley understands and agrees to proceed.   Plan:  Aortic valve sparing root and ascending aortic replacement with circulatory arrest on Monday 09/03/2014.

## 2014-09-03 NOTE — Consult Note (Cosign Needed)
PHARMACY CONSULT NOTE--POST-PROCEDURE ANTIBIOTICS   Consult  :  [facility] Indication :  Empiric Post-Procedure Prophylaxis   Pharmacy to review medication, including post-op antibiotics s/p Ascending Aortic Aneurysm repair.  Patient is to receive Zinacef and Vancomycin for empiric post- procedure prophylaxis.   Wt 65 kg,  CrCl ~ 89.6 ml/min.  Zinacef 1.5g IV q12h x 4 doses and Vancomycin 1 dose post-op tonight ordered - dosing adjustments are not required.  No drug-related issues noted.  Pharmacy will sign off. Please reconsult if additional assistance is needed.   Thank you.  Breelyn Icard, Elisha HeadlandEarle J,  Pharm.D.,  09/03/2014,  7:37 PM

## 2014-09-03 NOTE — Anesthesia Preprocedure Evaluation (Addendum)
Anesthesia Evaluation  Patient identified by MRN, date of birth, ID band Patient awake    Reviewed: Allergy & Precautions, H&P , NPO status , Patient's Chart, lab work & pertinent test results  History of Anesthesia Complications Negative for: history of anesthetic complications  Airway Mallampati: II  TM Distance: >3 FB     Dental  (+) Teeth Intact, Dental Advisory Given   Pulmonary neg pulmonary ROS,    Pulmonary exam normal       Cardiovascular + Peripheral Vascular Disease  Left ventricle: The cavity size was normal. Systolic function was normal. The estimated ejection fraction was in the range of 55% to 60%. Wall motion was normal; there were no regional wall motion abnormalities. - Aortic valve: Bicuspid; normal thickness leaflets. There was trivial regurgitation. - Aorta: Aortic root dimension: 41 mm (ED). Ascending aortic diameter: 50 mm (S). - Ascending aorta: The ascending aorta was mildly dilated. - Mitral valve: Redundnat chordae tendinae noted in the mid LV cavity. Calcified annulus. There was mild regurgitation.     Neuro/Psych PSYCHIATRIC DISORDERS Depression negative neurological ROS     GI/Hepatic Neg liver ROS, GERD-  Medicated,  Endo/Other  negative endocrine ROS  Renal/GU negative Renal ROS     Musculoskeletal   Abdominal   Peds  Hematology   Anesthesia Other Findings   Reproductive/Obstetrics                            Anesthesia Physical Anesthesia Plan  ASA: III  Anesthesia Plan: General   Post-op Pain Management:    Induction: Intravenous  Airway Management Planned: Oral ETT  Additional Equipment: Arterial line, TEE and PA Cath  Intra-op Plan:   Post-operative Plan: Post-operative intubation/ventilation  Informed Consent: I have reviewed the patients History and Physical, chart, labs and discussed the procedure including the risks, benefits and  alternatives for the proposed anesthesia with the patient or authorized representative who has indicated his/her understanding and acceptance.   Dental advisory given  Plan Discussed with: CRNA, Anesthesiologist and Surgeon  Anesthesia Plan Comments:         Anesthesia Quick Evaluation

## 2014-09-03 NOTE — Progress Notes (Signed)
T[facility] BRIEF SICU PROGRESS NOTE  Day of Surgery  S/P Procedure(s) (LRB): ASCENDING AORTIC ROOT REPLACEMENT & VALVE SPARING ROOT (N/A) INTRAOPERATIVE TRANSESOPHAGEAL ECHOCARDIOGRAM (N/A)   Extubated uneventfully NSR w/ stable hemodynamics Chest tube output low UOP adequate  Plan: Continue routine early postop  Elizabeth Hayden H 09/03/2014 10:05 PM

## 2014-09-03 NOTE — Transfer of Care (Signed)
Immediate Anesthesia Transfer of Care Note  Patient: Elizabeth Hayden  Procedure(s) Performed: Procedure(s) with comments: ASCENDING AORTIC ROOT REPLACEMENT & VALVE SPARING ROOT (N/A) - CIRC ARREST INTRAOPERATIVE TRANSESOPHAGEAL ECHOCARDIOGRAM (N/A)  Patient Location: ICU  Anesthesia Type:General  Level of Consciousness: sedated  Airway & Oxygen Therapy: Patient remains intubated per anesthesia plan and Patient placed on Ventilator (see vital sign flow sheet for setting)  Post-op Assessment: report given to ICU RN  Post vital signs: Reviewed and stable  Complications: No apparent anesthesia complications

## 2014-09-03 NOTE — Op Note (Signed)
CARDIOVASCULAR SURGERY OPERATIVE NOTE  09/03/2014  Surgeon:  Alleen Borne, MD  First Assistant: Gershon Crane, PA-C   Preoperative Diagnosis:  Bicuspid aortic valve and 5.1 cm enlarging aortic root and ascending aortic aneurysm   Postoperative Diagnosis:  Same   Procedure:  1. Median Sternotomy 2. Extracorporeal circulation 3.   Valve-sparing aortic root replacement with reimplantation of the native aortic valve and left and right coronary arteries ( 28 mm Gelweave valsalva graft). 4.   Replacement of ascending aortic aneurysm with a 28 mm Hemashield graft using deep hypothermic circulatory arrest.   Anesthesia:  General Endotracheal   Clinical History/Surgical Indication:  The patient is a 43 year old woman with a history of bicuspid aortic valve, coarctation repair at age 43 through a left thoracotomy, and known aortic root and ascending aortic dilation. A 2D echo in 09/2011 showed an aortic root diameter of 42 mm, STJ diameter of 40 mm, and ascending aortic diameter of 49 mm. An MRA in 10/2011 showed the diameter of the ascending aorta at the level of the LPA to be 45 mm compared to the descending aorta that was 23 mm. A recent MRA on 07/23/2014 shows the ascending aorta to be 51 mm. The left subclavian artery has its origin from the descending aorta. The aorta in the area of the DA is narrowed to 17 mm but there is no gradient. Cardiac MR shows a bicuspid valve with no significant stenosis or regurgitation and normal EF of 71%. She is doing well and denies any shortness of breath. She has occassional chest discomfort that is vague. She has a history of chronic generalized edema of unclear etiology but is no longer on lasix for that and it has not been a problem lately. Cardiac cath showed severe aortic root enlargement with mild narrowing at the previous coarctation repair with no gradient and normal coronary arteries. She has an aortic root and ascending aortic aneurysm with a maximum  diameter in the mid ascending aorta of 5.1 cm. This has increased from 4.5 cm in 2013. She has a bicuspid aortic valve that structurally looks good with no stenosis or regurgitation and looks suitable for a valve sparing aortic replacement. Her aneurysm is at a size that should be replaced especially with a bicuspid valve in young person. I discussed the very small chance of having to replace her valve and the recommendation for a mechanical valve given her age. She understands that she would have to be on lifelong coumadin and is agreement with that. She is not planning any further pregnancies. I discussed the operative procedure with the patient and family including alternatives, benefits and risks; including but not limited to bleeding, blood transfusion, infection, stroke, myocardial infarction, graft failure, heart block requiring a permanent pacemaker, organ dysfunction, and death. Elizabeth Hayden understands and agrees to proceed.    Preparation:  The patient was seen in the preoperative holding area and the correct patient, correct operation were confirmed with the patient after reviewing the medical record and catheterization. The consent was signed by me. Preoperative antibiotics were given. A pulmonary arterial line and radial arterial line were placed by the anesthesia team. The patient was taken back to the operating room and positioned supine on the operating room table. After being placed under general endotracheal anesthesia by the anesthesia team a foley catheter was placed. The neck, chest, abdomen, and both legs were prepped with betadine soap and solution and draped in the usual sterile manner. A surgical time-out  was taken and the correct patient and operative procedure were confirmed with the nursing and anesthesia staff.    Pre-bypass TEE:  Performed by Dr. Kipp Broodavid Joslin.    This showed a true bicuspid aortic valve with no AS or AI. The leaflets appeared structurally normal.  LV  function looked normal.  There was no MR.   Cardiopulmonary Bypass:  A median sternotomy was performed. The pericardium was opened in the midline. Right ventricular function appeared normal. The ascending aorta was aneurysmal and had no palpable plaque. There were no contraindications to aortic cannulation or cross-clamping. The patient was fully systemically heparinized and the ACT was maintained > 400 sec. The distal ascending aorta was cannulated with a 20 F aortic cannula for arterial inflow. Venous cannulation was performed via the right atrial appendage using a two-staged venous cannula. An antegrade cardioplegia/vent cannula was inserted into the mid-ascending aorta. Aortic occlusion was performed with a single cross-clamp.     Resection and grafting of ascending aortic aneurysm:  The patient was placed on cardiopulmonary bypass and a left ventricular vent was placed via the right superior pulmonary vein. Systemic cooling was begun with a goal temperature of 18 degrees centigrade by bladder and rectal temperature probes. A retrograde cardioplegia cannula was placed through the right atrium into the coronary sinus without difficulty. A retrograde cerebral perfusion cannula was placed into the SVC through a pursestring suture and the SVC was encircled with a silastic tape. After 30 minutes of cooling the target temperature of 18 degrees centigrade was reached. Cerebral oximetry was 87% bilaterally. BIS was zero. The patient was given 100 mg Propofol and 125 mg of Solumedrol. The head was packed in ice. The bed was placed in steep trendelenburg. Circulatory arrest was begun and the blood volume emptied into the venous reservoir.  The aorta was crossclamped and 500 cc of cold blood antegrade cardioplegia was given into the aorta with quick arrest of the heart. Continuous retrograde cerebral perfusion was begun and the SVC occluded with the silastic tape. Myocardial temperature dropped to 10 degrees  centigrade. Additional doses of cold blood retrograde cardioplegia were given at approximately 20 minute intervals throughout the period of circulatory arrest and cross-clamping. Complete diastolic arrest was maintained. The aortic cannula was removed. The aorta was transected just proximal to the innominate artery beveling the resection out along the undersurface of the aortic arch (Hemiarch replacement). The aortic diameter was measured at 28 mm here. A 28 x 10 mm Hemasheild Platinum vascular graft was prepared. ( Catalog # [id number], Lot # [id number]). It was anastomosed to the aortic arch in an end to end manner using 3-0 prolene continuous suture with a felt strip to reinforce the anastomisis. A light coating of CoSeal was applied to seal needle holes. The arterial end of the bypass circuit was then connected to the 10mm side arm graft and circulation was slowly resumed. The tape was removed from the SVC. The aortic graft was cross-clamped proximal to the side arm graft and full CPB support was resumed. Circulatory arrest time was 23 min. Retrograde cerebral perfusion time was 20 min.   Valve sparing root replacement:   The ascending aorta was mobilized from the right pulmonary artery and main PA. It was opened longitudinally to a point 2 cm above the STJ and the valve inspected. There were 2 leaflets that were thin and pliable. There was no calcium. There did not appear to be any prolapse. The right and left coronary arteries were removed from  the aortic root with a button of aortic wall around the ostia. They were retracted carefully out of the way with stay sutures to prevent rotation. Stay sutures were placed at each commissure. The plane between the aorta and the surrounding structures was dissected using electrocautery down to level just below the nadir of the valve cusps. The excess aortic sinus tissue was excised leaving a 5 mm rim above the annulus. A 2-0 Ethibond horizontal mattress suture was  placed below the nadir of each cusp from the ventricular side to outside the aorta. 2 additional sutures were placed beneath each commissure so that all 4 sutures were in the same plane. The optimal STJ diameter was determined using a valve sizer and a 28 mm Gelweave Valsalva graft chosen ( Cat # [id number], SN [id number]). The proximal skirt of the graft was shortened to 2 rings. The 4 sub-annular sutures were placed through the skirt at the proper position. The commissures were brought inside the graft and the graft lowered into place. The sub-annular sutures were tied. The position of the commissures were determined and they were held in place using pledgetted 4-0 prolene mattress sutures through the graft from inside to outside. Then the distal valve suture line was performed using 4-0 prolene sutures beginning at the nadir of each cusp and tieing the sutures together at the top of the commissures outside the graft. This resulted in a normal appearing bicuspid valve which appeared completely competent. Small openings were made in the graft for the coronary anastomoses using a thermal cautery. Then the left and right coronary buttons were anastomosed to the graft in an end to side manner using continuous 5-0 prolene suture. A light coating of CoSeal was applied to each anastomosis for hemostasis. The two grafts were then cut to the appropriate length and anastomosed end to end using continuous 3-0 prolene suture. CoSeal was applied to seal the needle holes in the grafts. The proximal and distal aortic grafts were then cut to the appropriate length and anastomosed using continuous 4-0 prolene suture. A vent cannula was placed into the graft to remove any air. Deairing maneuvers were performed and the bed placed in trendelenburg position.   Completion:  The patient was rewarmed to 37 degrees Centigrade.  The crossclamp was removed with a time of 184 minutes. There was spontaneous return of sinus rhythm. The  anastomoses were checked for hemostasis.  Two temporary epicardial pacing wires were placed on the right atrium and two on the right ventricle.  The LV vent and retrograde cannula were removed. The patient was weaned from CPB without difficulty on no inotropes. CPB time was 233 minutes. TEE is as noted below. There was no AI. Cardiac output was 5 LPM. Heparin was fully reversed with protamine. The 10 mm side arm arterial perfusion graft was ligated with a heavy silk tie and suture ligated with 3-0 prolene and the venous cannula removed. Hemostasis was achieved. Mediastinal drainage tubes were placed. The sternum was closed with single #6 stainless steel wires. The fascia was closed with continuous # 1 vicryl suture. The subcutaneous tissue was closed with 2-0 vicryl continuous suture. The skin was closed with 3-0 vicryl subcuticular suture. All sponge, needle, and instrument counts were reported correct at the end of the case. Dry sterile dressings were placed over the incisions and around the chest tubes which were connected to pleurevac suction. The patient was then transported to the surgical intensive care unit in critical but stable condition.   Post-bypass  TEE:              *[place]*         *[facility]*            [address]            [place], [place] [address]              [phone]  ------------------------------------------------------------------- Transesophageal Echocardiography  Patient:  Elizabeth Hayden, Elizabeth Hayden MR #:    [id number] Study Date: 09/03/2014 Gender:   F Age:    43 Height: Weight: BSA: Pt. Status: Room:    2S02C  ADMITTING  Evelene Croon, MD ATTENDING  Evelene Croon, MD ORDERING   Evelene Croon, MD REFERRING  Evelene Croon, MD SONOGRAPHER Cathie Beams PERFORMING  Heather Roberts,  MD  cc:  -------------------------------------------------------------------  ------------------------------------------------------------------- Study Conclusions  - Left ventricle: The cavity size was normal. Wall thickness was normal. Systolic function was normal. - Aortic valve: Normal-sized annulus. Bicuspid. - Aorta: The aorta was severely dilated and mildly calcified. There was an aneurysm. There was no evidence for dissection.  Impressions:  - Post repair, Aortic and Mitral valve unchanged. Aortic graft in good position.  Diagnostic transesophageal echocardiography. 2D and color Doppler. Birthdate: Patient birthdate: 03/24/1971. Age: Patient is 43 yr old. Sex: Gender: female. Patient status: Inpatient. Study date: Study date: 09/03/2014. Study time: 08:53 AM. Location: Operating room.  -------------------------------------------------------------------  ------------------------------------------------------------------- Left ventricle: The cavity size was normal. Wall thickness was normal. Systolic function was normal.  ------------------------------------------------------------------- Aortic valve:  Normal-sized annulus. Bicuspid.  ------------------------------------------------------------------- Aorta: The aorta was severely dilated and mildly calcified. There was an aneurysm. There was no evidence for dissection.  ------------------------------------------------------------------- Mitral valve:  Structurally normal valve.  Leaflet separation was normal. No evidence of vegetation. Doppler: There was trivial regurgitation.  ------------------------------------------------------------------- Left atrium: The atrium was normal in size. No evidence of thrombus in the appendage. The appendage was of normal size. Emptying velocity was normal.  ------------------------------------------------------------------- Atrial septum: No  defect or patent foramen ovale was identified.  ------------------------------------------------------------------- Pulmonic valve:  Structurally normal valve.  Cusp separation was normal. No evidence of vegetation.  ------------------------------------------------------------------- Tricuspid valve:  Structurally normal valve.  Leaflet separation was normal. No evidence of vegetation. Doppler: There was no regurgitation.  ------------------------------------------------------------------- Right atrium: The atrium was normal in size. No evidence of thrombus in the atrial cavity or appendage.  ------------------------------------------------------------------- Pericardium: The pericardium was normal in appearance. There was no pericardial effusion.  ------------------------------------------------------------------- Post bypass:  ------------------------------------------------------------------- Post procedure conclusions Left ventricle:  - Good LVEF. - Aortic graft visualized, no dissection seen. Aortic valve appears unchanged post bypass.  Aortic valve:  - No AI seen in LV outflow tract.  Mitral valve:  - No change post bypass.  Ascending Aorta:  - The aorta was severely dilated and mildly calcified.  ------------------------------------------------------------------- Prepared and Electronically Authenticated by  Heather Roberts, MD 2015-11-23T16:43:45

## 2014-09-03 NOTE — Procedures (Signed)
Extubation Procedure Note  Patient Details:   Name: Elizabeth Hayden DOB: 06/08/1971 MRN: [id number]   Airway Documentation:     Evaluation  O2 sats: stable throughout Complications: No apparent complications Patient did tolerate procedure well. Bilateral Breath Sounds: Clear, Diminished   Yes  Ok AnisKelly Smith, MA 09/03/2014, 6:26 PM

## 2014-09-03 NOTE — Progress Notes (Signed)
  Echocardiogram Echocardiogram Transesophageal has been performed.  Cathie BeamsGREGORY, Tra Wilemon 09/03/2014, 9:14 AM

## 2014-09-03 NOTE — Progress Notes (Signed)
NIF -35 VC 1855 with good effort prior to extubation.

## 2014-09-03 NOTE — Interval H&P Note (Signed)
History and Physical Interval Note:  09/03/2014 6:44 AM  Elizabeth Hayden  has presented today for surgery, with the diagnosis of ASCENDING AORTIC ANEURYSM   The various methods of treatment have been discussed with the patient and family. After consideration of risks, benefits and other options for treatment, the patient has consented to  Procedure(s) with comments: ASCENDING AORTIC ROOT REPLACEMENT & VALVE SPARING ROOT (N/A) - CIRC ARREST INTRAOPERATIVE TRANSESOPHAGEAL ECHOCARDIOGRAM (N/A) as a surgical intervention .  The patient's history has been reviewed, patient examined, no change in status, stable for surgery.  I have reviewed the patient's chart and labs.  Questions were answered to the patient's satisfaction.     Alleen BorneBARTLE,BRYAN K

## 2014-09-03 NOTE — Anesthesia Postprocedure Evaluation (Signed)
  Anesthesia Post-op Note  Patient: Elizabeth Hayden  Procedure(s) Performed: Procedure(s) with comments: ASCENDING AORTIC ROOT REPLACEMENT & VALVE SPARING ROOT (N/A) - CIRC ARREST INTRAOPERATIVE TRANSESOPHAGEAL ECHOCARDIOGRAM (N/A)  Patient Location: ICU  Anesthesia Type:General  Level of Consciousness: sedated  Airway and Oxygen Therapy: Patient remains intubated per anesthesia plan and Patient placed on Ventilator (see vital sign flow sheet for setting)  Post-op Pain: none  Post-op Assessment: Post-op Vital signs reviewed, Patient's Cardiovascular Status Stable, Respiratory Function Stable, Patent Airway and No signs of Nausea or vomiting  Post-op Vital Signs: Reviewed and stable  Last Vitals:  Filed Vitals:   09/03/14 1432  BP: 105/61  Pulse: 72  Temp:   Resp: 12    Complications: No apparent anesthesia complications

## 2014-09-03 NOTE — Progress Notes (Signed)
Utilization Review Completed.Elizabeth Hayden T11/23/2015  

## 2014-09-04 ENCOUNTER — Encounter (HOSPITAL_COMMUNITY): Payer: Self-pay | Admitting: Surgery

## 2014-09-04 ENCOUNTER — Inpatient Hospital Stay (HOSPITAL_COMMUNITY): Payer: 59

## 2014-09-04 LAB — POCT I-STAT, CHEM 8
BUN: 6 mg/dL (ref 6–23)
CHLORIDE: 98 meq/L (ref 96–112)
Calcium, Ion: 1.16 mmol/L (ref 1.12–1.23)
Creatinine, Ser: 0.7 mg/dL (ref 0.50–1.10)
Glucose, Bld: 195 mg/dL — ABNORMAL HIGH (ref 70–99)
HEMATOCRIT: 31 % — AB (ref 36.0–46.0)
Hemoglobin: 10.5 g/dL — ABNORMAL LOW (ref 12.0–15.0)
POTASSIUM: 4 meq/L (ref 3.7–5.3)
Sodium: 134 mEq/L — ABNORMAL LOW (ref 137–147)
TCO2: 23 mmol/L (ref 0–100)

## 2014-09-04 LAB — GLUCOSE, CAPILLARY
GLUCOSE-CAPILLARY: 115 mg/dL — AB (ref 70–99)
GLUCOSE-CAPILLARY: 122 mg/dL — AB (ref 70–99)
GLUCOSE-CAPILLARY: 123 mg/dL — AB (ref 70–99)
GLUCOSE-CAPILLARY: 125 mg/dL — AB (ref 70–99)
GLUCOSE-CAPILLARY: 128 mg/dL — AB (ref 70–99)
GLUCOSE-CAPILLARY: 129 mg/dL — AB (ref 70–99)
GLUCOSE-CAPILLARY: 86 mg/dL (ref 70–99)
GLUCOSE-CAPILLARY: 95 mg/dL (ref 70–99)
GLUCOSE-CAPILLARY: 95 mg/dL (ref 70–99)
Glucose-Capillary: 117 mg/dL — ABNORMAL HIGH (ref 70–99)
Glucose-Capillary: 137 mg/dL — ABNORMAL HIGH (ref 70–99)
Glucose-Capillary: 128 mg/dL — ABNORMAL HIGH (ref 70–99)
Glucose-Capillary: 150 mg/dL — ABNORMAL HIGH (ref 70–99)
Glucose-Capillary: 121 mg/dL — ABNORMAL HIGH (ref 70–99)
Glucose-Capillary: 115 mg/dL — ABNORMAL HIGH (ref 70–99)
Glucose-Capillary: 93 mg/dL (ref 70–99)
Glucose-Capillary: 116 mg/dL — ABNORMAL HIGH (ref 70–99)
Glucose-Capillary: 122 mg/dL — ABNORMAL HIGH (ref 70–99)
Glucose-Capillary: 124 mg/dL — ABNORMAL HIGH (ref 70–99)
Glucose-Capillary: 125 mg/dL — ABNORMAL HIGH (ref 70–99)
Glucose-Capillary: 111 mg/dL — ABNORMAL HIGH (ref 70–99)
Glucose-Capillary: 127 mg/dL — ABNORMAL HIGH (ref 70–99)
Glucose-Capillary: 117 mg/dL — ABNORMAL HIGH (ref 70–99)

## 2014-09-04 LAB — CBC
HCT: 29.6 % — ABNORMAL LOW (ref 36.0–46.0)
HEMATOCRIT: 30 % — AB (ref 36.0–46.0)
HEMOGLOBIN: 10.1 g/dL — AB (ref 12.0–15.0)
HEMOGLOBIN: 9.5 g/dL — AB (ref 12.0–15.0)
MCH: 31 pg (ref 26.0–34.0)
MCH: 29.1 pg (ref 26.0–34.0)
MCHC: 33.7 g/dL (ref 30.0–36.0)
MCHC: 32.1 g/dL (ref 30.0–36.0)
MCV: 92 fL (ref 78.0–100.0)
MCV: 90.5 fL (ref 78.0–100.0)
Platelets: 129 10*3/uL — ABNORMAL LOW (ref 150–400)
Platelets: 134 10*3/uL — ABNORMAL LOW (ref 150–400)
RBC: 3.26 MIL/uL — ABNORMAL LOW (ref 3.87–5.11)
RBC: 3.27 MIL/uL — AB (ref 3.87–5.11)
RDW: 13.5 % (ref 11.5–15.5)
RDW: 13.7 % (ref 11.5–15.5)
WBC: 19.9 10*3/uL — ABNORMAL HIGH (ref 4.0–10.5)
WBC: 23.2 10*3/uL — ABNORMAL HIGH (ref 4.0–10.5)

## 2014-09-04 LAB — CREATININE, SERUM
Creatinine, Ser: 0.66 mg/dL (ref 0.50–1.10)
GFR calc Af Amer: >90 mL/min (ref >90)
GFR calc non Af Amer: >90 mL/min (ref >90)

## 2014-09-04 LAB — BASIC METABOLIC PANEL
Anion gap: 12 (ref 5–15)
BUN: 7 mg/dL (ref 6–23)
CALCIUM: 7.6 mg/dL — AB (ref 8.4–10.5)
CO2: 19 meq/L (ref 19–32)
CREATININE: 0.55 mg/dL (ref 0.50–1.10)
Chloride: 103 mEq/L (ref 96–112)
GFR calc Af Amer: >90 mL/min (ref >90)
Glucose, Bld: 106 mg/dL — ABNORMAL HIGH (ref 70–99)
Potassium: 4.6 mEq/L (ref 3.7–5.3)
SODIUM: 134 meq/L — AB (ref 137–147)

## 2014-09-04 LAB — MAGNESIUM
Magnesium: 2.4 mg/dL (ref 1.5–2.5)
Magnesium: 2.2 mg/dL (ref 1.5–2.5)

## 2014-09-04 MED ORDER — ENOXAPARIN SODIUM 40 MG/0.4ML ~~LOC~~ SOLN
40.0000 mg | Freq: Every day | SUBCUTANEOUS | Status: DC
  Administered 2014-09-04 – 2014-09-06 (×3): 40 mg via SUBCUTANEOUS
  Filled 2014-09-04 (×5): qty 0.4

## 2014-09-04 MED ORDER — INSULIN ASPART 100 UNIT/ML ~~LOC~~ SOLN
0.0000 [IU] | SUBCUTANEOUS | Status: DC
  Administered 2014-09-04 – 2014-09-05 (×4): 2 [IU] via SUBCUTANEOUS

## 2014-09-04 MED ORDER — INSULIN ASPART 100 UNIT/ML ~~LOC~~ SOLN: 0.0000 [IU] | SUBCUTANEOUS | Status: DC

## 2014-09-04 MED ORDER — INFLUENZA VAC SPLIT QUAD 0.5 ML IM SUSY
0.5000 mL | PREFILLED_SYRINGE | INTRAMUSCULAR | Status: DC
  Filled 2014-09-04: qty 0.5

## 2014-09-04 MED ORDER — FUROSEMIDE 10 MG/ML IJ SOLN
40.0000 mg | Freq: Two times a day (BID) | INTRAMUSCULAR | Status: AC
  Administered 2014-09-04 (×2): 40 mg via INTRAVENOUS
  Filled 2014-09-04 (×2): qty 4

## 2014-09-04 MED ORDER — POTASSIUM CHLORIDE 10 MEQ/50ML IV SOLN
10.0000 meq | INTRAVENOUS | Status: AC
  Administered 2014-09-04 (×3): 10 meq via INTRAVENOUS
  Filled 2014-09-04: qty 50

## 2014-09-04 MED FILL — Magnesium Sulfate Inj 50%: INTRAMUSCULAR | Qty: 10 | Status: AC

## 2014-09-04 MED FILL — Potassium Chloride Inj 2 mEq/ML: INTRAVENOUS | Qty: 40 | Status: AC

## 2014-09-04 MED FILL — Heparin Sodium (Porcine) Inj 1000 Unit/ML: INTRAMUSCULAR | Qty: 30 | Status: AC

## 2014-09-04 NOTE — Progress Notes (Signed)
Patient ID: Elizabeth Hayden, female   DOB: 01/25/1971, 43 y.o.   MRN: [id number] EVENING ROUNDS NOTE :     [address]       [place] [id number]             [phone]                 1 Day Post-Op Procedure(s) (LRB): ASCENDING AORTIC ROOT REPLACEMENT & VALVE SPARING ROOT (N/A) INTRAOPERATIVE TRANSESOPHAGEAL ECHOCARDIOGRAM (N/A)  Total Length of Stay:  LOS: 1 day  BP 96/57 mmHg  Pulse 85  Temp(Src) 97.8 F (36.6 C) (Oral)  Resp 25  Wt 159 lb 13.3 oz (72.5 kg)  SpO2 98%  LMP 08/07/2014  .Intake/Output      11/23 0701 - 11/24 0700 11/24 0701 - 11/25 0700   I.V. (mL/kg) 4305.1 (59.4) 390 (5.4)   Blood 425    IV Piggyback 650 50   Total Intake(mL/kg) 5380.1 (74.2) 440 (6.1)   Urine (mL/kg/hr) 2615 (1.5) 1095 (1.3)   Blood 950 (0.5)    Chest Tube 354 (0.2) 30 (0)   Total Output 3919 1125   Net +1461.1 -685          . sodium chloride Stopped (09/03/14 1600)  . sodium chloride    . sodium chloride Stopped (09/04/14 0900)  . dexmedetomidine Stopped (09/03/14 1615)  . lactated ringers 20 mL/hr at 09/04/14 0700  . nitroGLYCERIN Stopped (09/03/14 1430)  . phenylephrine (NEO-SYNEPHRINE) Adult infusion Stopped (09/04/14 0200)     Lab Results  Component Value Date   WBC 23.2* 09/04/2014   HGB 10.5* 09/04/2014   HCT 31.0* 09/04/2014   PLT 134* 09/04/2014   GLUCOSE 195* 09/04/2014   CHOL 148 11/13/2008   TRIG 48 08/01/2013   HDL 67 08/01/2013   LDLCALC 105* 08/01/2013   ALT 18 08/31/2014   AST 22 08/31/2014   NA 134* 09/04/2014   K 4.0 09/04/2014   CL 98 09/04/2014   CREATININE 0.70 09/04/2014   BUN 6 09/04/2014   CO2 19 09/04/2014   TSH 1.870 08/01/2013   INR 1.72* 09/03/2014   HGBA1C 5.2 08/31/2014   Stable day, has not walked yet  Delight OvensEdward B Easter Schinke MD  Beeper [phone] Office [phone] 09/04/2014 6:18 PM

## 2014-09-04 NOTE — Plan of Care (Signed)
Problem: Consults Goal: Cardiac Surgery Patient Education ( See Patient Education module for education specifics.) Outcome: Progressing Goal: Skin Care Protocol Initiated - if Braden Score 18 or less If consults are not indicated, leave blank or document N/A Outcome: Progressing Goal: Diabetes Guidelines if Diabetic/Glucose > 140 If diabetic or lab glucose is > 140 mg/dl - Initiate Diabetes/Hyperglycemia Guidelines & Document Interventions  Outcome: Progressing  Problem: Phase I - Pre-Op Goal: Pain controlled with appropriate interventions Outcome: Progressing  Problem: Phase II - Intermediate Post-Op Goal: Wean to Extubate Outcome: Completed/Met Date Met:  09/04/14 Goal: Maintain Hemodynamic Stability Outcome: Progressing Goal: CBGs/Blood Glucose per SCIP Criteria Outcome: Progressing Goal: Pain controlled with appropriate interventions Outcome: Progressing Goal: Advance Diet Outcome: Completed/Met Date Met:  09/04/14

## 2014-09-04 NOTE — Clinical Documentation Improvement (Signed)
  Abnormal findings (laboratory, x-ray, MRI/CT scans, and other diagnostic results) are not coded and reported unless the physician indicates their clinical significance. The medical record reflects the following abnormal laboratory value(s). If possible, please help by clarifying the specific medical condition related to these abnormal laboratory findings.  - Potassium Chloride injections 80 MEQ given in OR on 09/03/14 - Potassium Chloride IVPB 10 MEQ given x5 on 09/03/14  Component      Potassium  Latest Ref Rng      3.7 - 5.3 mEq/L  09/03/2014      8:01 AM 3.5 (L)  09/03/2014      8:50 AM 3.4 (L)  09/03/2014      9:20 AM 3.2 (L)  09/03/2014     10:12 AM 4.6  09/03/2014     11:15 AM 4.0  09/03/2014     12:09 PM 4.3  09/03/2014      1:12 PM 3.9  09/03/2014      2:40 PM 2.9 (LL)    Thank you for your time with this!   Servando SnareSalena Derric Dealmeida, RN Clinical Documentation Improvement Specialist ([phone]) [phone]

## 2014-09-04 NOTE — Progress Notes (Signed)
1 Day Post-Op Procedure(s) (LRB): ASCENDING AORTIC ROOT REPLACEMENT & VALVE SPARING ROOT (N/A) INTRAOPERATIVE TRANSESOPHAGEAL ECHOCARDIOGRAM (N/A) Subjective:  Sore  Objective: Vital signs in last 24 hours: Temp:  [95 F (35 C)-99 F (37.2 C)] 99 F (37.2 C) (11/24 0700) Pulse Rate:  [72-94] 91 (11/24 0700) Cardiac Rhythm:  [-] Atrial paced (11/24 0400) Resp:  [12-26] 21 (11/24 0700) BP: (82-105)/(53-73) 94/57 mmHg (11/24 0700) SpO2:  [96 %-100 %] 99 % (11/24 0700) Arterial Line BP: (95-121)/(49-74) 105/49 mmHg (11/24 0700) FiO2 (%):  [40 %-50 %] 40 % (11/23 1742) Weight:  [72.5 kg (159 lb 13.3 oz)] 72.5 kg (159 lb 13.3 oz) (11/24 0500)  Hemodynamic parameters for last 24 hours: PAP: (18-24)/(7-14) 20/10 mmHg CO:  [3.1 L/min-5.5 L/min] 5.5 L/min CI:  [1.7 L/min/m2-3 L/min/m2] 3 L/min/m2  Intake/Output from previous day: 11/23 0701 - 11/24 0700 In: 5380.1 [I.V.:4305.1; Blood:425; IV Piggyback:650] Out: 3919 [Urine:2615; Blood:950; Chest Tube:354] Intake/Output this shift:    General appearance: alert and cooperative Neurologic: intact Heart: regular rate and rhythm, S1, S2 normal, no murmur, click, rub or gallop Lungs: clear to auscultation bilaterally Extremities: edema moderate Wound: dressing dry  Lab Results:  Recent Labs  09/03/14 2000 09/03/14 2009 09/04/14 0411  WBC 16.3*  --  19.9*  HGB 10.1* 9.9* 10.1*  HCT 29.5* 29.0* 30.0*  PLT 123*  --  129*   BMET:  Recent Labs  09/03/14 2009 09/04/14 0411  NA 139 134*  Hayden 4.6 4.6  CL 107 103  CO2  --  19  GLUCOSE 107* 106*  BUN 6 7  CREATININE 0.50 0.55  CALCIUM  --  7.6*    PT/INR:  Recent Labs  09/03/14 1436  LABPROT 20.3*  INR 1.72*   ABG    Component Value Date/Time   PHART 7.330* 09/03/2014 2026   HCO3 21.4 09/03/2014 2026   TCO2 23 09/03/2014 2026   ACIDBASEDEF 4.0* 09/03/2014 2026   O2SAT 97.0 09/03/2014 2026   CBG (last 3)   Recent Labs  09/03/14 2123 09/03/14 2205  09/03/14 2327  GLUCAP 150* 121* 86   CXR: bibasilar atelectasis  Assessment/Plan: S/P Procedure(s) (LRB): ASCENDING AORTIC ROOT REPLACEMENT & VALVE SPARING ROOT (N/A) INTRAOPERATIVE TRANSESOPHAGEAL ECHOCARDIOGRAM (N/A) Mobilize Diuresis d/c tubes/lines Continue foley due to diuresing patient, patient in ICU and urinary output monitoring See progression orders   LOS: 1 day    Elizabeth Hayden 09/04/2014

## 2014-09-05 ENCOUNTER — Inpatient Hospital Stay (HOSPITAL_COMMUNITY): Payer: 59

## 2014-09-05 ENCOUNTER — Encounter: Payer: Self-pay | Admitting: Surgery

## 2014-09-05 LAB — CBC
HCT: 27.5 % — ABNORMAL LOW (ref 36.0–46.0)
Hemoglobin: 9 g/dL — ABNORMAL LOW (ref 12.0–15.0)
MCH: 30 pg (ref 26.0–34.0)
MCHC: 32.7 g/dL (ref 30.0–36.0)
MCV: 91.7 fL (ref 78.0–100.0)
PLATELETS: 127 10*3/uL — AB (ref 150–400)
RBC: 3 MIL/uL — ABNORMAL LOW (ref 3.87–5.11)
RDW: 13.9 % (ref 11.5–15.5)
WBC: 22 10*3/uL — AB (ref 4.0–10.5)

## 2014-09-05 LAB — BASIC METABOLIC PANEL
ANION GAP: 10 (ref 5–15)
BUN: 9 mg/dL (ref 6–23)
CALCIUM: 7.9 mg/dL — AB (ref 8.4–10.5)
CO2: 26 mEq/L (ref 19–32)
CREATININE: 0.67 mg/dL (ref 0.50–1.10)
Chloride: 99 mEq/L (ref 96–112)
Glucose, Bld: 140 mg/dL — ABNORMAL HIGH (ref 70–99)
Potassium: 4.4 mEq/L (ref 3.7–5.3)
Sodium: 135 mEq/L — ABNORMAL LOW (ref 137–147)

## 2014-09-05 LAB — GLUCOSE, CAPILLARY
GLUCOSE-CAPILLARY: 122 mg/dL — AB (ref 70–99)
GLUCOSE-CAPILLARY: 135 mg/dL — AB (ref 70–99)

## 2014-09-05 NOTE — Progress Notes (Signed)
Patient ID: Elizabeth StanleyMelissa M Hayden, female   DOB: 03/30/1971, 43 y.o.   MRN: [id number]  SICU Evening Rounds:  Hemodynamically stable. Neo weaned off this evening.  Walked twice today.  Will plan to transfer to 2W tomorrow as long as BP remains stable.

## 2014-09-05 NOTE — Progress Notes (Signed)
2 Days Post-Op Procedure(s) (LRB): ASCENDING AORTIC ROOT REPLACEMENT & VALVE SPARING ROOT (N/A) INTRAOPERATIVE TRANSESOPHAGEAL ECHOCARDIOGRAM (N/A) Subjective:  No complaints  Did not walk yesterday  Put back on neo overnight. Currently on 12 mcg  Objective: Vital signs in last 24 hours: Temp:  [97.8 F (36.6 C)-98.7 F (37.1 C)] 97.8 F (36.6 C) (11/25 0404) Pulse Rate:  [85-105] 94 (11/25 0700) Cardiac Rhythm:  [-] Normal sinus rhythm (11/25 0750) Resp:  [11-53] 22 (11/25 0700) BP: (88-121)/(46-68) 96/59 mmHg (11/25 0700) SpO2:  [89 %-100 %] 95 % (11/25 0700) Arterial Line BP: (118-129)/(53-60) 129/60 mmHg (11/24 1000) Weight:  [70.3 kg (154 lb 15.7 oz)-72.5 kg (159 lb 13.3 oz)] 70.3 kg (154 lb 15.7 oz) (11/25 0500)  Hemodynamic parameters for last 24 hours:    Intake/Output from previous day: 11/24 0701 - 11/25 0700 In: 1338 [P.O.:480; I.V.:708; IV Piggyback:150] Out: 2950 [Urine:2920; Chest Tube:30] Intake/Output this shift:    General appearance: alert and cooperative Neurologic: intact Heart: regular rate and rhythm, S1, S2 normal, no murmur, click, rub or gallop Lungs: diminished breath sounds bibasilar Extremities: edema mild Wound: dressing dry  Lab Results:  Recent Labs  09/04/14 1600 09/04/14 1602 09/05/14 0430  WBC 23.2*  --  22.0*  HGB 9.5* 10.5* 9.0*  HCT 29.6* 31.0* 27.5*  PLT 134*  --  127*   BMET:  Recent Labs  09/04/14 0411  09/04/14 1602 09/05/14 0430  NA 134*  --  134* 135*  K 4.6  --  4.0 4.4  CL 103  --  98 99  CO2 19  --   --  26  GLUCOSE 106*  --  195* 140*  BUN 7  --  6 9  CREATININE 0.55  < > 0.70 0.67  CALCIUM 7.6*  --   --  7.9*  < > = values in this interval not displayed.  PT/INR:  Recent Labs  09/03/14 1436  LABPROT 20.3*  INR 1.72*   ABG    Component Value Date/Time   PHART 7.330* 09/03/2014 2026   HCO3 21.4 09/03/2014 2026   TCO2 23 09/04/2014 1602   ACIDBASEDEF 4.0* 09/03/2014 2026   O2SAT 97.0  09/03/2014 2026   CBG (last 3)   Recent Labs  09/04/14 2321 09/05/14 0359 09/05/14 0736  GLUCAP 117* 122* 135*   CXR: bibasilar atelectasis  Assessment/Plan: S/P Procedure(s) (LRB): ASCENDING AORTIC ROOT REPLACEMENT & VALVE SPARING ROOT (N/A) INTRAOPERATIVE TRANSESOPHAGEAL ECHOCARDIOGRAM (N/A)   She is back on neo this am so will wean that as tolerated. Hold off on further diuresis for now. No beta blocker until stable off neo. Work on IS and ambulate. Weight is still 11 lbs over baseline so she will need further diuresis once off neo.  LOS: 2 days    Evelene CroonBARTLE,BRYAN K 09/05/2014

## 2014-09-06 MED ORDER — ONDANSETRON HCL 4 MG/2ML IJ SOLN: 4.0000 mg | Freq: Four times a day (QID) | INTRAMUSCULAR | Status: DC | PRN

## 2014-09-06 MED ORDER — SODIUM CHLORIDE 0.9 % IJ SOLN: 3.0000 mL | INTRAMUSCULAR | Status: DC | PRN

## 2014-09-06 MED ORDER — ASPIRIN EC 325 MG PO TBEC
325.0000 mg | DELAYED_RELEASE_TABLET | Freq: Every day | ORAL | Status: DC
  Administered 2014-09-07 – 2014-09-08 (×2): 325 mg via ORAL
  Filled 2014-09-06 (×2): qty 1

## 2014-09-06 MED ORDER — SODIUM CHLORIDE 0.9 % IJ SOLN
3.0000 mL | Freq: Two times a day (BID) | INTRAMUSCULAR | Status: DC
  Administered 2014-09-06 – 2014-09-07 (×3): 3 mL via INTRAVENOUS

## 2014-09-06 MED ORDER — TRAMADOL HCL 50 MG PO TABS: 50.0000 mg | ORAL_TABLET | ORAL | Status: DC | PRN

## 2014-09-06 MED ORDER — BISACODYL 10 MG RE SUPP
10.0000 mg | Freq: Every day | RECTAL | Status: DC | PRN
Start: 2014-09-06 — End: 2014-09-08

## 2014-09-06 MED ORDER — OXYCODONE HCL 5 MG PO TABS
5.0000 mg | ORAL_TABLET | ORAL | Status: DC | PRN
  Administered 2014-09-06 – 2014-09-08 (×4): 5 mg via ORAL
  Filled 2014-09-06 (×4): qty 1

## 2014-09-06 MED ORDER — FUROSEMIDE 40 MG PO TABS
40.0000 mg | ORAL_TABLET | Freq: Every day | ORAL | Status: DC
  Administered 2014-09-06 – 2014-09-08 (×3): 40 mg via ORAL
  Filled 2014-09-06 (×4): qty 1

## 2014-09-06 MED ORDER — BISACODYL 5 MG PO TBEC
10.0000 mg | DELAYED_RELEASE_TABLET | Freq: Every day | ORAL | Status: DC | PRN
  Administered 2014-09-07: 10 mg via ORAL
  Filled 2014-09-06: qty 2

## 2014-09-06 MED ORDER — ACETAMINOPHEN 325 MG PO TABS: 650.0000 mg | ORAL_TABLET | Freq: Four times a day (QID) | ORAL | Status: DC | PRN

## 2014-09-06 MED ORDER — POTASSIUM CHLORIDE CRYS ER 20 MEQ PO TBCR
20.0000 meq | EXTENDED_RELEASE_TABLET | Freq: Two times a day (BID) | ORAL | Status: DC
  Administered 2014-09-06 – 2014-09-08 (×5): 20 meq via ORAL
  Filled 2014-09-06 (×7): qty 1

## 2014-09-06 MED ORDER — DOCUSATE SODIUM 100 MG PO CAPS
200.0000 mg | ORAL_CAPSULE | Freq: Every day | ORAL | Status: DC
  Administered 2014-09-07 – 2014-09-08 (×2): 200 mg via ORAL
  Filled 2014-09-06 (×2): qty 2

## 2014-09-06 MED ORDER — SODIUM CHLORIDE 0.9 % IV SOLN: 250.0000 mL | INTRAVENOUS | Status: DC | PRN

## 2014-09-06 MED ORDER — MOVING RIGHT ALONG BOOK
Freq: Once | Status: AC
  Administered 2014-09-06: 11:00:00
  Filled 2014-09-06: qty 1

## 2014-09-06 MED ORDER — ONDANSETRON HCL 4 MG PO TABS: 4.0000 mg | ORAL_TABLET | Freq: Four times a day (QID) | ORAL | Status: DC | PRN

## 2014-09-06 NOTE — Progress Notes (Signed)
Pt ambulated from 2S to 2W pushing wheelchair with staff assistance.  No complaints.  Pt rec'd by unit and transferred to care.  Elizabeth Hayden, [facility]

## 2014-09-06 NOTE — Progress Notes (Signed)
3 Days Post-Op Procedure(s) (LRB): ASCENDING AORTIC ROOT REPLACEMENT & VALVE SPARING ROOT (N/A) INTRAOPERATIVE TRANSESOPHAGEAL ECHOCARDIOGRAM (N/A) Subjective: Feeling better, ate dinner, slept some Walked 3 times yesterday. Some nausea toward end of walks  Objective: Vital signs in last 24 hours: Temp:  [97.4 F (36.3 C)-98.6 F (37 C)] 98.4 F (36.9 C) (11/26 0400) Pulse Rate:  [84-115] 99 (11/26 0700) Cardiac Rhythm:  [-] Normal sinus rhythm (11/26 0400) Resp:  [17-53] 46 (11/26 0700) BP: (91-113)/(50-79) 112/66 mmHg (11/26 0700) SpO2:  [88 %-100 %] 96 % (11/26 0600) Weight:  [70 kg (154 lb 5.2 oz)] 70 kg (154 lb 5.2 oz) (11/26 0500)  Hemodynamic parameters for last 24 hours:    Intake/Output from previous day: 11/25 0701 - 11/26 0700 In: 576.8 [P.O.:300; I.V.:276.8] Out: 760 [Urine:760] Intake/Output this shift:    General appearance: alert and cooperative Neurologic: intact Heart: regular rate and rhythm, S1, S2 normal, no murmur, click, rub or gallop Lungs: diminished breath sounds bibasilar Abdomen: soft, non-tender; bowel sounds normal; no masses,  no organomegaly Extremities: edema mild Wound: incision ok  Lab Results:  Recent Labs  09/04/14 1600 09/04/14 1602 09/05/14 0430  WBC 23.2*  --  22.0*  HGB 9.5* 10.5* 9.0*  HCT 29.6* 31.0* 27.5*  PLT 134*  --  127*   BMET:  Recent Labs  09/04/14 0411  09/04/14 1602 09/05/14 0430  NA 134*  --  134* 135*  K 4.6  --  4.0 4.4  CL 103  --  98 99  CO2 19  --   --  26  GLUCOSE 106*  --  195* 140*  BUN 7  --  6 9  CREATININE 0.55  < > 0.70 0.67  CALCIUM 7.6*  --   --  7.9*  < > = values in this interval not displayed.  PT/INR:  Recent Labs  09/03/14 1436  LABPROT 20.3*  INR 1.72*   ABG    Component Value Date/Time   PHART 7.330* 09/03/2014 2026   HCO3 21.4 09/03/2014 2026   TCO2 23 09/04/2014 1602   ACIDBASEDEF 4.0* 09/03/2014 2026   O2SAT 97.0 09/03/2014 2026   CBG (last 3)   Recent Labs  09/04/14 2321 09/05/14 0359 09/05/14 0736  GLUCAP 117* 122* 135*    Assessment/Plan: S/P Procedure(s) (LRB): ASCENDING AORTIC ROOT REPLACEMENT & VALVE SPARING ROOT (N/A) INTRAOPERATIVE TRANSESOPHAGEAL ECHOCARDIOGRAM (N/A)  She has been hemodynamically stable overnight and generally feeling better. Will hold off on beta blocker while diuresing to avoid hypotension.  Mobilize Diuresis: weight up 11 lbs from preop Plan for transfer to step-down: see transfer orders   LOS: 3 days    Geofrey Silliman K 09/06/2014

## 2014-09-06 NOTE — Progress Notes (Signed)
Pt up ambulating with family; no needs; will cont. To monitor.

## 2014-09-07 LAB — BASIC METABOLIC PANEL
Anion gap: 11 (ref 5–15)
BUN: 8 mg/dL (ref 6–23)
CHLORIDE: 103 meq/L (ref 96–112)
CO2: 27 meq/L (ref 19–32)
Calcium: 8.1 mg/dL — ABNORMAL LOW (ref 8.4–10.5)
Creatinine, Ser: 0.53 mg/dL (ref 0.50–1.10)
GFR calc Af Amer: >90 mL/min (ref >90)
GFR calc non Af Amer: >90 mL/min (ref >90)
GLUCOSE: 114 mg/dL — AB (ref 70–99)
POTASSIUM: 4 meq/L (ref 3.7–5.3)
SODIUM: 141 meq/L (ref 137–147)

## 2014-09-07 LAB — CBC
HEMATOCRIT: 23.5 % — AB (ref 36.0–46.0)
Hemoglobin: 7.8 g/dL — ABNORMAL LOW (ref 12.0–15.0)
MCH: 31.1 pg (ref 26.0–34.0)
MCHC: 33.2 g/dL (ref 30.0–36.0)
MCV: 93.6 fL (ref 78.0–100.0)
Platelets: 168 10*3/uL (ref 150–400)
RBC: 2.51 MIL/uL — AB (ref 3.87–5.11)
RDW: 14 % (ref 11.5–15.5)
WBC: 10.2 10*3/uL (ref 4.0–10.5)

## 2014-09-07 MED ORDER — METOPROLOL TARTRATE 12.5 MG HALF TABLET
12.5000 mg | ORAL_TABLET | Freq: Two times a day (BID) | ORAL | Status: DC
Start: 2014-09-07 — End: 2014-09-08
  Administered 2014-09-07 – 2014-09-08 (×3): 12.5 mg via ORAL
  Filled 2014-09-07 (×4): qty 1

## 2014-09-07 MED ORDER — FERROUS GLUCONATE 324 (38 FE) MG PO TABS
324.0000 mg | ORAL_TABLET | Freq: Two times a day (BID) | ORAL | Status: DC
  Administered 2014-09-07 – 2014-09-08 (×3): 324 mg via ORAL
  Filled 2014-09-07 (×5): qty 1

## 2014-09-07 NOTE — Plan of Care (Signed)
Problem: Phase II - Intermediate Post-Op Goal: Maintain Hemodynamic Stability Outcome: Completed/Met Date Met:  09/07/14 Goal: Pain controlled with appropriate interventions Outcome: Completed/Met Date Met:  09/07/14

## 2014-09-07 NOTE — Progress Notes (Signed)
CARDIAC REHAB PHASE I   PRE:  Rate/Rhythm: 101 ST  BP:  Sitting: 112/61      SaO2: 98 RA  MODE:  Ambulation: 350 ft   POST:  Rate/Rhythm: 108 ST  BP:  Sitting: 108/60  Pt walked 350 ft independently, with steady gait.  Pt c/o mild fatigue and SOB upon returning to pt room, but she stated she was able to walk at a faster pace today.  Completed pt education and reinforced IS use.  [facility]  Pt voiced understanding and did not have any questions at this time.  [phone]  Marvene StaffBarrett, Shalin Vonbargen D MS, ACSM RCEP 9:00 AM 09/07/2014

## 2014-09-07 NOTE — Progress Notes (Addendum)
      [address]       [place] [id number]             [phone]      4 Days Post-Op Procedure(s) (LRB): ASCENDING AORTIC ROOT REPLACEMENT & VALVE SPARING ROOT (N/A) INTRAOPERATIVE TRANSESOPHAGEAL ECHOCARDIOGRAM (N/A) Subjective: Feels pretty well , mild nausea at times  Objective: Vital signs in last 24 hours: Temp:  [98 F (36.7 C)-99.5 F (37.5 C)] 98.3 F (36.8 C) (11/27 0556) Pulse Rate:  [10-106] 96 (11/27 0556) Cardiac Rhythm:  [-] Sinus tachycardia (11/26 2031) Resp:  [18-35] 18 (11/27 0556) BP: (104-117)/(59-68) 109/67 mmHg (11/27 0556) SpO2:  [92 %-99 %] 99 % (11/27 0556) Weight:  [152 lb 14.4 oz (69.355 kg)] 152 lb 14.4 oz (69.355 kg) (11/27 0556)  Hemodynamic parameters for last 24 hours:    Intake/Output from previous day: 11/26 0701 - 11/27 0700 In: 360 [P.O.:360] Out: 350 [Urine:350] Intake/Output this shift:    General appearance: alert, cooperative and no distress Heart: regular rate and rhythm Lungs: clear to auscultation bilaterally Abdomen: benign Extremities: no sig edema Wound: incis healing well  Lab Results:  Recent Labs  09/05/14 0430 09/07/14 0300  WBC 22.0* 10.2  HGB 9.0* 7.8*  HCT 27.5* 23.5*  PLT 127* 168   BMET:  Recent Labs  09/05/14 0430 09/07/14 0300  NA 135* 141  K 4.4 4.0  CL 99 103  CO2 26 27  GLUCOSE 140* 114*  BUN 9 8  CREATININE 0.67 0.53  CALCIUM 7.9* 8.1*    PT/INR: No results for input(s): LABPROT, INR in the last 72 hours. ABG    Component Value Date/Time   PHART 7.330* 09/03/2014 2026   HCO3 21.4 09/03/2014 2026   TCO2 23 09/04/2014 1602   ACIDBASEDEF 4.0* 09/03/2014 2026   O2SAT 97.0 09/03/2014 2026   CBG (last 3)   Recent Labs  09/04/14 2321 09/05/14 0359 09/05/14 0736  GLUCAP 117* 122* 135*    Meds Scheduled Meds: . aspirin EC  325 mg Oral Daily  . buPROPion  450 mg Oral Daily  . docusate sodium  200 mg Oral Daily  . enoxaparin (LOVENOX) injection  40 mg  Subcutaneous QHS  . furosemide  40 mg Oral Daily  . Influenza vac split quadrivalent PF  0.5 mL Intramuscular Tomorrow-1000  . potassium chloride  20 mEq Oral BID  . sodium chloride  3 mL Intravenous Q12H   Continuous Infusions:  PRN Meds:.sodium chloride, acetaminophen, bisacodyl **OR** bisacodyl, ondansetron **OR** ondansetron (ZOFRAN) IV, oxyCODONE, sodium chloride, traMADol  Xrays No results found.  Assessment/Plan: S/P Procedure(s) (LRB): ASCENDING AORTIC ROOT REPLACEMENT & VALVE SPARING ROOT (N/A) INTRAOPERATIVE TRANSESOPHAGEAL ECHOCARDIOGRAM (N/A)  1 doing very well 2 tachycardia, will add low dose beta blocker as appears BP will tolerate 3 diurese further , edema is much improved 4 H/H 7.8/23, tol well- will add Iron supplement    LOS: 4 days    GOLD,WAYNE E 09/07/2014   Chart reviewed, patient examined, agree with above. She feels well and is ambulating well.  Hgb dropped from 9.0 2 days ago to 7.8 this am. She is asymptomatic. No visible blood loss. Will repeat in the am to be sure this is stable. It could be lab error or postop equilibration. We have not been able to diurese her much due to borderline bp for the first few days postop requiring neo. Possibly home tomorrow if no changes.

## 2014-09-07 NOTE — Discharge Instructions (Signed)
Activity: 1.May walk up steps °               2.No lifting more than ten pounds for four weeks.  °               3.No driving for four weeks. °               4.Stop any activity that causes chest pain, shortness of breath, dizziness, sweating or excessive weakness. °               5.Avoid straining. °               6.Continue with your breathing exercises daily. ° °Diet: Regular diet ° °Wound Care: May shower.  Clean wounds with mild soap and water daily. Contact the office at [phone] if any problems arise. ° ° °

## 2014-09-07 NOTE — Progress Notes (Signed)
Pt up ambulating in hallway with sister at this time; no needs voiced; will cont. To monitor.

## 2014-09-07 NOTE — Discharge Summary (Signed)
Physician Discharge Summary       [address]       [place] [id number]             [phone]    Patient ID: Elizabeth StanleyMelissa M Segar MRN: [id number] DOB/AGE: 45/12/1970 43 y.o.  Admit date: 09/03/2014 Discharge date: 09/08/2014  Admission Diagnoses: 1. Bicuspid aortic valve 2. 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 History of GERD 5. History of depression  Discharge Diagnoses:  1. Bicuspid aortic valve 2. 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 History of GERD 5. History of depression 6. ABL anemia  Procedure (s):  1. Median Sternotomy 2. Extracorporeal circulation 3. Valve-sparing aortic root replacement with reimplantation of the native aortic valve and left and right coronary arteries ( 28 mm Gelweave valsalva graft). 4. Replacement of ascending aortic aneurysm with a 28 mm Hemashield graft using deep hypothermic circulatory arrest by Dr. Laneta SimmersBartle on 09/03/2014.  History of Presenting Illness: The patient is a 43 year old woman with a history of bicuspid aortic valve, coarctation repair at age 89 through a left thoracotomy, and known aortic root and ascending aortic dilation. A 2D echo in 09/2011 showed an aortic root diameter of 42 mm, STJ diameter of 40 mm, and ascending aortic diameter of 49 mm. An MRA in 10/2011 showed the diameter of the ascending aorta at the level of the LPA to be 45 mm compared to the descending aorta that was 23 mm. A recent MRA on 07/23/2014 shows the ascending aorta to be 51 mm. The left subclavian artery has its origin from the descending aorta. The aorta in the area of the DA is narrowed to 17 mm but there is no gradient. Cardiac MR shows a bicuspid valve with no  significant stenosis or regurgitation and normal EF of 71%. She is doing well and denies any shortness of breath. She has occassional chest discomfort that is vague. She has a history of chronic generalized edema of unclear etiology but is no longer on lasix for that and it has not been a problem lately. Dr. Laneta SimmersBartle discussed the need for an aortic valve sparing root and ascending aortic replacement with circulatory arrest. Potential risks, benefits, and complications were discussed with the patient and she agreed to proceed with surgery.  Brief Hospital Course:  The patient was extubated the evening of surgery without difficulty. She was weaned off of Neo Synephrine. She remained afebrile and hemodynamically stable. Elizabeth MurdochSwan Ganz, a line, chest tubes, and foley were removed early in the post operative course. Lopressor was started. She was volume over loaded and diuresed. She was weaned off the insulin drip. The patient's HGA1C pre op was 5.2. She had thrombocytopenia, but this did resolve as her platelet count went up to 278,000. She did have ABL anemia.  She did not require a post op transfusion. She was started on Fergon.The patient was felt surgically stable for transfer from the ICU to PCTU for further convalescence on 09/06/2013. She continues to progress with cardiac rehab. She was ambulating on room air. She has been tolerating a diet and has had a bowel movement. Epicardial pacing wires and chest tube sutures will be removed prior to discharge. Provided the patient remains afebrile, hemodynamically stable, and pending morning round evaluation, she  will be surgically stable for discharge on 09/08/2014.    Latest Vital Signs: Blood pressure 119/69, pulse 92, temperature 98 F (36.7 C), temperature source Oral, resp. rate 18, height 5\' 8"  (1.727 m), weight 149 lb 12.8 oz (67.949 kg), last menstrual period 08/07/2014, SpO2 95 %.  Physical Exam: General appearance: alert, cooperative and no  distress Heart: regular rate and rhythm Lungs: clear to auscultation bilaterally Abdomen: benign Extremities: no sig edema Wound: incis healing well    Discharge Condition:Stable  Recent laboratory studies:  Lab Results  Component Value Date   WBC 8.8 09/08/2014   HGB 8.3* 09/08/2014   HCT 25.7* 09/08/2014   MCV 94.5 09/08/2014   PLT 278 09/08/2014   Lab Results  Component Value Date   NA 141 09/07/2014   K 4.0 09/07/2014   CL 103 09/07/2014   CO2 27 09/07/2014   CREATININE 0.53 09/07/2014   GLUCOSE 114* 09/07/2014     Diagnostic Studies: Dg Chest Port 1 View  09/05/2014   CLINICAL DATA:  Status post aortic root replacement for aneurysm.  EXAM: PORTABLE CHEST - 1 VIEW  COMPARISON:  Portable chest x-ray of September 04, 2014  FINDINGS: The lungs are adequately inflated. The right hemidiaphragm remains partially obscured. The left hemidiaphragm is better demonstrated. The pulmonary interstitial markings and peripheral pulmonary vascularity are less conspicuous. The cardiac silhouette is normal in size. The mediastinum is normal in width. There are 7 intact sternal wires. The right internal jugular Cordis sheath remains. The Swan-Ganz catheter is been removed. The 2 mediastinal drains have been removed.  IMPRESSION: Improved appearance of both lungs with resolving interstitial edema. Minimal basilar atelectasis and pleural fluid is present on the right. There is decreasing basilar atelectasis on the left.   Electronically Signed   By: David  [place]   On: 09/05/2014 07:28  Discharge Instructions    Amb Referral to Cardiac Rehabilitation    Complete by:  As directed            Discharge Medications:   Medication List    STOP taking these medications        amoxicillin 500 MG capsule  Commonly known as:  AMOXIL      TAKE these medications        ascorbic acid 250 MG Chew  Commonly known as:  VITAMIN C  Chew 250 mg by mouth daily.     aspirin 325 MG EC tablet  Take 1  tablet (325 mg total) by mouth daily.     CALCIUM-VITAMIN D PO  Take 2 tablets by mouth daily.     ferrous gluconate 324 MG tablet  Commonly known as:  FERGON  Take 1 tablet (324 mg total) by mouth 2 (two) times daily with a meal.     FORFIVO XL 450 MG Tb24  Generic drug:  BuPROPion HCl ER (XL)  Take 1 tablet by mouth  daily     ibuprofen 200 MG tablet  Commonly known as:  ADVIL,MOTRIN  Take 400 mg by mouth every 6 (six) hours as needed for headache.     metoprolol tartrate 12.5 mg Tabs tablet  Commonly known as:  LOPRESSOR  Take 0.5 tablets (12.5 mg total) by mouth 2 (two) times daily.     multivitamin capsule  Take 1 capsule by mouth daily.     oxyCODONE 5 MG immediate release tablet  Commonly known as:  Oxy IR/ROXICODONE  Take 1-2 tablets (5-10 mg total) by mouth every 3 (three) hours as  needed for severe pain.     ranitidine 75 MG tablet  Commonly known as:  ZANTAC  Take 150 mg by mouth daily.        The patient has been discharged on:   1.Beta Blocker:  Yes [ x  ]                              No   [   ]                              If No, reason:  2.Ace Inhibitor/ARB: Yes [   ]                                     No  [  x  ]                                     If No, reason: Normal LVEF  3.Statin:   Yes [   ]                  No  [ x  ]                  If No, reason: No hyperlipidemia, no CAD  4.Marlowe Kays:  Yes  [ x  ]                  No   [   ]                  If No, reason:   Follow Up Appointments: Follow-up Information    Follow up with Charlton Haws, MD.   Specialty:  Cardiology   Why:  Call for a follow up for 2 weeks   Contact information:   [address] [place] [address] [phone]       Follow up with Alleen Borne, MD On 09/07/2014.   Specialty:  Cardiothoracic Surgery   Why:  PA/LAT CXR to be taken at [facility] which is in the same building as Dr. Sharee Pimple office) one hour prior to office appointment  with Dr. Laneta Simmers. Office will mail appointment date and time.   Contact information:   [address] [employer] [address] [place] [address] [phone]       Signed: Coral Ceo HPA-C 09/20/2014, 12:33 PM

## 2014-09-08 LAB — CBC
HEMATOCRIT: 25.7 % — AB (ref 36.0–46.0)
HEMOGLOBIN: 8.3 g/dL — AB (ref 12.0–15.0)
MCH: 30.5 pg (ref 26.0–34.0)
MCHC: 32.3 g/dL (ref 30.0–36.0)
MCV: 94.5 fL (ref 78.0–100.0)
Platelets: 278 10*3/uL (ref 150–400)
RBC: 2.72 MIL/uL — AB (ref 3.87–5.11)
RDW: 14.1 % (ref 11.5–15.5)
WBC: 8.8 10*3/uL (ref 4.0–10.5)

## 2014-09-08 MED ORDER — INFLUENZA VAC SPLIT QUAD 0.5 ML IM SUSY
0.5000 mL | PREFILLED_SYRINGE | Freq: Once | INTRAMUSCULAR | Status: AC
  Administered 2014-09-08: 0.5 mL via INTRAMUSCULAR
  Filled 2014-09-08: qty 0.5

## 2014-09-08 MED ORDER — METOPROLOL TARTRATE 12.5 MG HALF TABLET: 12.5000 mg | ORAL_TABLET | Freq: Two times a day (BID) | ORAL | Status: DC

## 2014-09-08 MED ORDER — OXYCODONE HCL 5 MG PO TABS: 5.0000 mg | ORAL_TABLET | ORAL | Status: DC | PRN

## 2014-09-08 MED ORDER — ASPIRIN 325 MG PO TBEC: 325.0000 mg | DELAYED_RELEASE_TABLET | Freq: Every day | ORAL | Status: DC

## 2014-09-08 MED ORDER — FERROUS GLUCONATE 324 (38 FE) MG PO TABS: 324.0000 mg | ORAL_TABLET | Freq: Two times a day (BID) | ORAL | Status: DC

## 2014-09-08 NOTE — Plan of Care (Signed)
Problem: Phase II - Intermediate Post-Op Goal: CBGs/Blood Glucose per SCIP Criteria Outcome: Completed/Met Date Met:  09/08/14 Goal: Activity Progressed Outcome: Not Applicable Date Met:  26/71/24

## 2014-09-08 NOTE — Progress Notes (Signed)
1237- Patient just ambulated 550 ft independently without c/o.  Artist Paislinty M Ryoma Nofziger, MS, ACSM CCEP

## 2014-09-08 NOTE — Plan of Care (Signed)
Problem: Surgery Discharge Goal: Barriers To Progression Addressed/Resolved Outcome: Completed/Met Date Met:  09/08/14 Goal: Discharge plan in place and appropriate Outcome: Completed/Met Date Met:  09/08/14 Goal: Pain controlled with appropriate interventions Outcome: Completed/Met Date Met:  09/08/14 Goal: Tolerating diet Outcome: Completed/Met Date Met:  09/08/14 Goal: Activity appropriate for discharge plan Outcome: Completed/Met Date Met:  09/08/14 Goal: Other Discharge Outcomes/Goals Outcome: Completed/Met Date Met:  09/08/14

## 2014-09-08 NOTE — Progress Notes (Addendum)
       [address]       [place] [id number]             [phone]          5 Days Post-Op Procedure(s) (LRB): ASCENDING AORTIC ROOT REPLACEMENT & VALVE SPARING ROOT (N/A) INTRAOPERATIVE TRANSESOPHAGEAL ECHOCARDIOGRAM (N/A)  Subjective: Feels well, hoping to go home today.   Objective: Vital signs in last 24 hours: Patient Vitals for the past 24 hrs:  BP Temp Temp src Pulse Resp SpO2 Weight  09/08/14 1104 119/69 mmHg - - 92 - - -  09/08/14 1030 121/66 mmHg - - 93 - - -  09/08/14 1000 116/66 mmHg - - 90 - - -  09/08/14 0929 135/65 mmHg - - 88 - - -  09/08/14 0612 103/62 mmHg 98 F (36.7 C) Oral 84 18 95 % 149 lb 12.8 oz (67.949 kg)  09/07/14 2310 - 98.1 F (36.7 C) Oral - - - -  09/07/14 1953 (!) 102/59 mmHg 99.1 F (37.3 C) Oral 95 18 100 % -  09/07/14 1519 100/63 mmHg 98.2 F (36.8 C) Oral 90 18 97 % -   Current Weight  09/08/14 149 lb 12.8 oz (67.949 kg)  PRE-OPERATIVE WEIGHT: 64kg   Intake/Output from previous day: 11/27 0701 - 11/28 0700 In: 360 [P.O.:360] Out: 3100 [Urine:3100]    PHYSICAL EXAM:  Heart: RRR Lungs: Clear Wound: Clean and dry Extremities: No significant LE edema    Lab Results: CBC:  Recent Labs  09/07/14 0300 09/08/14 0957  WBC 10.2 8.8  HGB 7.8* 8.3*  HCT 23.5* 25.7*  PLT 168 278   BMET:   Recent Labs  09/07/14 0300  NA 141  K 4.0  CL 103  CO2 27  GLUCOSE 114*  BUN 8  CREATININE 0.53  CALCIUM 8.1*    PT/INR: No results for input(s): LABPROT, INR in the last 72 hours.    Assessment/Plan: S/P Procedure(s) (LRB): ASCENDING AORTIC ROOT REPLACEMENT & VALVE SPARING ROOT (N/A) INTRAOPERATIVE TRANSESOPHAGEAL ECHOCARDIOGRAM (N/A) CV- BPs stable, no further tachycardia after initiation of beta blocker. D/c EPWs. Expected postop blood loss anemia- labs pending this am.  Will follow up CBC. Hopefully home later today if labs stable.   LOS: 5 days    Elizabeth Hayden  H 09/08/2014  ADDENDUM: Hemoglobin and hematocrit stable, pt doing well.  Plan d/c home.

## 2014-09-08 NOTE — Progress Notes (Signed)
Removed EPW and CT sutures per MD order per hospital policy. Patient tolerated well, pacing wires intact. Slight bleeding from the right side applied gauze and tape to site. BP 135/65, HR 88. Applied benzoin and steri strips to CT suture site. Lajuana Matteina Shermaine Brigham, RN

## 2014-09-20 ENCOUNTER — Encounter (HOSPITAL_COMMUNITY): Payer: Self-pay | Admitting: Cardiovascular Disease

## 2014-09-21 ENCOUNTER — Ambulatory Visit (INDEPENDENT_AMBULATORY_CARE_PROVIDER_SITE_OTHER): Payer: 59 | Admitting: Nurse Practitioner

## 2014-09-21 ENCOUNTER — Encounter: Payer: Self-pay | Admitting: Nurse Practitioner

## 2014-09-21 VITALS — BP 100/60 | HR 83 | Ht 68.0 in | Wt 139.0 lb

## 2014-09-21 DIAGNOSIS — Q251 Coarctation of aorta: Secondary | ICD-10-CM

## 2014-09-21 DIAGNOSIS — Z95828 Presence of other vascular implants and grafts: Secondary | ICD-10-CM

## 2014-09-21 LAB — CBC
HCT: 33.1 % — ABNORMAL LOW (ref 34.0–46.6)
Hemoglobin: 11.4 g/dL (ref 11.1–15.9)
MCH: 28.7 pg (ref 26.6–33.0)
MCHC: 34.4 g/dL (ref 31.5–35.7)
MCV: 83 fL (ref 79–97)
Platelets: 615 10*3/uL — ABNORMAL HIGH (ref 150–379)
RBC: 3.97 x10E6/uL (ref 3.77–5.28)
RDW: 14.2 % (ref 12.3–15.4)
WBC: 8.9 10*3/uL (ref 3.4–10.8)

## 2014-09-21 LAB — BASIC METABOLIC PANEL
BUN/Creatinine Ratio: 13 (ref 9–23)
BUN: 9 mg/dL (ref 6–24)
CO2: 26 mmol/L (ref 18–29)
Calcium: 9.6 mg/dL (ref 8.7–10.2)
Chloride: 99 mmol/L (ref 96–108)
Creatinine, Ser: 0.68 mg/dL (ref 0.57–1.00)
GFR calc Af Amer: 122 mL/min/{1.73_m2} (ref >59)
GFR calc non Af Amer: 106 mL/min/{1.73_m2} (ref >59)
Glucose: 102 mg/dL — ABNORMAL HIGH (ref 65–99)
Potassium: 4.2 mmol/L (ref 3.5–5.2)
Sodium: 137 mmol/L (ref 134–144)

## 2014-09-21 NOTE — Progress Notes (Signed)
Elizabeth Hayden Date of Birth: 11/30/1970 Medical Record #[id number]  History of Present Illness: Ms. Samons is seen back today for a 2 month check. Seen for Dr. Eden Emms. She is a 43 year old female with history of bicuspid AV with coarctation repair at age 74. Has had enlarging ascending aortic aneurysm. Has had long standing issues with swelling. Not felt to be cardiac related. Other issues include GERD and depression.   She has had increase in size of her ascending aortic aneurysm - referred to [facility]. She underwent valve-sparing aortic root replacement with reimplantation of the native aortic valve and left and right coronary arteries ( 28 mm Gelweave valsalva graft) with replacement of ascending aortic aneurysm with a 28 mm Hemashield graft using deep hypothermic circulatory arrest by Dr. Laneta Simmers on 09/03/2014. Per post op course was uncomplicated except for blood loss anemia.   Comes in today. Here with her significant other - Eric. Has been home almost 3 weeks. She feels a little sore but overall doing very well. Going up and down the stairs in her home. On very little pain medicine - using more ibuprofen instead. No chest pain. Not short of breath. Not dizzy or lightheaded. Bowels working. Sleep is off but getting better.  Wants to go back to work (from home) and to drive. 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 No fever or chills. All of her incisions are doing ok. She was placed on low dose beta blocker due to tachycardia.    Current Outpatient Prescriptions  Medication Sig Dispense Refill  . ascorbic acid (VITAMIN C) 250 MG CHEW Chew 250 mg by mouth daily.    Marland Kitchen aspirin EC 325 MG EC tablet Take 1 tablet (325 mg total) by mouth daily. 30 tablet 0  . CALCIUM-VITAMIN D PO Take 2 tablets by mouth daily.      . ferrous gluconate (FERGON) 324 MG tablet Take 1 tablet (324 mg total) by mouth 2 (two) times daily with a meal. 60 tablet 1  . FORFIVO XL 450 MG TB24 Take 1 tablet by  mouth  daily 90 tablet 1  . ibuprofen (ADVIL,MOTRIN) 200 MG tablet Take 400 mg by mouth every 6 (six) hours as needed for headache.    . metoprolol tartrate (LOPRESSOR) 12.5 mg TABS tablet Take 0.5 tablets (12.5 mg total) by mouth 2 (two) times daily. 60 tablet 1  . Multiple Vitamin (MULTIVITAMIN) capsule Take 1 capsule by mouth daily.      Marland Kitchen oxyCODONE (OXY IR/ROXICODONE) 5 MG immediate release tablet Take 1-2 tablets (5-10 mg total) by mouth every 3 (three) hours as needed for severe pain. 30 tablet 0  . ranitidine (ZANTAC) 75 MG tablet Take 150 mg by mouth daily.     No current facility-administered medications for this visit.    Allergies  Allergen Reactions  . Ace Inhibitors     REACTION: rash and cough  . Altace [Ramipril] Hives and Itching    Past Medical History  Diagnosis Date  . TMJ (dislocation of temporomandibular joint)     Wears a night gear   . Congenital heart disease     bicuspid aortic valve with coarctation repair 04/1974  . Aortic root dilation   . Edema   . Fibrocystic breast   . HPV (human papilloma virus) infection   . Shortness of breath dyspnea     with exertion  . Sinus infection     Nov 16 and treated with antibiotic  .  Seizures     as a baby and only 1 time d/t high fever  . GERD (gastroesophageal reflux disease)     takes Zantac daily as needed  . UTI (lower urinary tract infection)     hx of   . Yeast infection     08/17/14  . Depression     Past Surgical History  Procedure Laterality Date  . Coarctation of aorta excision  1975  . Eye surgery    . Tee without cardioversion N/A 08/06/2014    Procedure: TRANSESOPHAGEAL ECHOCARDIOGRAM (TEE);  Surgeon: Wendall StadePeter C Nishan, MD;  [facility];  Service: Cardiovascular;  Laterality: N/A;  . Eye surgery Bilateral 1985  . Ascending aortic root replacement N/A 09/03/2014    Procedure: ASCENDING AORTIC ROOT REPLACEMENT & VALVE SPARING ROOT;  Surgeon: Alleen BorneBryan K Bartle, MD;  Location: [facility] OR;   Service: Open Heart Surgery;  Laterality: N/A;  [facility] ARREST  . Intraoperative transesophageal echocardiogram N/A 09/03/2014    Procedure: INTRAOPERATIVE TRANSESOPHAGEAL ECHOCARDIOGRAM;  Surgeon: Alleen BorneBryan K Bartle, MD;  [facility];  Service: Open Heart Surgery;  Laterality: N/A;  . Left heart catheterization with coronary angiogram N/A 08/06/2014    Procedure: LEFT HEART CATHETERIZATION WITH CORONARY ANGIOGRAM;  Surgeon: Wendall StadePeter C Nishan, MD;  [facility];  Service: Cardiovascular;  Laterality: N/A;    History  Smoking status  . Never Smoker   Smokeless tobacco  . Not on file    History  Alcohol Use  . 0.0 oz/week  . 0 Not specified per week    Comment: occasional    Family History  Problem Relation Age of Onset  . Hypertension Father   . Diabetes Father   . Cancer Father     lung   . Breast cancer Maternal Grandmother   . Leukemia Maternal Grandmother   . Ovarian cancer Paternal Grandmother     Review of Systems: The review of systems is per the HPI.  All other systems were reviewed and are negative.  Physical Exam: BP 100/60 mmHg  Pulse 83  Ht 5\' 8"  (1.727 m)  Wt 139 lb (63.05 kg)  BMI 21.14 kg/m2  LMP 08/07/2014 Patient is very pleasant and in no acute distress. Weight is down. Skin is warm and dry. Color is normal.  HEENT is unremarkable. Normocephalic/atraumatic. PERRL. Sclera are nonicteric. Neck is supple. No masses. No JVD. Lungs are clear. Cardiac exam shows a regular rate and rhythm. Very soft outflow murmur. Her sternum looks good. Still with a few steri strips over her CT sites. Abdomen is soft. Extremities are without edema. Gait and ROM are intact. No gross neurologic deficits noted.  Wt Readings from Last 3 Encounters:  09/21/14 139 lb (63.05 kg)  09/08/14 149 lb 12.8 oz (67.949 kg)  08/31/14 143 lb 11.2 oz (65.182 kg)    LABORATORY DATA/PROCEDURES:  EKG today with NSR  Lab Results  Component Value Date   WBC 8.8 09/08/2014   HGB 8.3*  09/08/2014   HCT 25.7* 09/08/2014   PLT 278 09/08/2014   GLUCOSE 114* 09/07/2014   CHOL 148 11/13/2008   TRIG 48 08/01/2013   HDL 67 08/01/2013   LDLCALC 105* 08/01/2013   ALT 18 08/31/2014   AST 22 08/31/2014   NA 141 09/07/2014   K 4.0 09/07/2014   CL 103 09/07/2014   CREATININE 0.53 09/07/2014   BUN 8 09/07/2014   CO2 27 09/07/2014   TSH 1.870 08/01/2013   INR 1.72* 09/03/2014   HGBA1C  5.2 08/31/2014    BNP (last 3 results) No results for input(s): PROBNP in the last 8760 hours.  Tee Study Conclusions from November 2015 (intra operative)  - Left ventricle: The cavity size was normal. Wall thickness was normal. Systolic function was normal. - Aortic valve: Normal-sized annulus. Bicuspid. - Aorta: The aorta was severely dilated and mildly calcified. There was an aneurysm. There was no evidence for dissection.  Impressions:  - Post repair, Aortic and Mitral valve unchanged. Aortic graft in good position.   TTE Echo Study Conclusions from October 2015  - Left ventricle: The cavity size was normal. Systolic function was normal. The estimated ejection fraction was in the range of 55% to 60%. Wall motion was normal; there were no regional wall motion abnormalities. - Aortic valve: Bicuspid; normal thickness leaflets. There was trivial regurgitation. - Aorta: Aortic root dimension: 41 mm (ED). Ascending aortic diameter: 50 mm (S). - Ascending aorta: The ascending aorta was mildly dilated. - Mitral valve: Redundnat chordae tendinae noted in the mid LV cavity. Calcified annulus. There was mild regurgitation.  Assessment / Plan: 1. Post op valve-sparing aortic root replacement with reimplantation of the native aortic valve and left and right coronary arteries ( 28 mm Gelweave valsalva graft) with replacement of ascending aortic aneurysm with a 28 mm Hemashield graft using deep hypothermic circulatory arrest by Dr. Laneta SimmersBartle on 09/03/2014. She is doing very  well clinically. Reminded about the lifting restrictions. Ok to walk. Will send a note to cardiac rehab. Ok to return to work from home 4 hours per day starting next week. See Dr. Eden EmmsNishan back in 4 weeks.   2. Post op anemia secondary to blood loss -  Recheck lab today.   3. Post op tachycardia - may not need long term beta blocker. Will monitor.  4. Bicuspid AV - to follow  See back in 4 weeks. [facility]. 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 She has follow up with Dr. Laneta SimmersBartle in January.   Patient is agreeable to this plan and will call if any problems develop in the interim.   Rosalio MacadamiaLori C. Odena Mcquaid, RN, ANP-C [facility] [address] [place], [place]  [id number] [phone]

## 2014-09-21 NOTE — Patient Instructions (Signed)
We will be checking the following labs today BMET and CBC - at [facility] Stay on your current medicines  I will send a note to cardiac rehab  See Dr. Eden EmmsNishan in 4 weeks  May return to work (but from home) for 4 hours per day starting 09/24/14  Ok to drive as of next week if not taking narcotics.  Call the [facility] office at [phone] if you have any questions, problems or concerns.

## 2014-10-16 ENCOUNTER — Other Ambulatory Visit: Payer: Self-pay | Admitting: Surgery

## 2014-10-16 DIAGNOSIS — I7121 Aneurysm of the ascending aorta, without rupture: Secondary | ICD-10-CM

## 2014-10-16 DIAGNOSIS — I712 Thoracic aortic aneurysm, without rupture: Secondary | ICD-10-CM

## 2014-10-17 ENCOUNTER — Ambulatory Visit
Admission: RE | Admit: 2014-10-17 | Discharge: 2014-10-17 | Disposition: A | Discharge status: Home / Self Care | Payer: 59 | Source: Ambulatory Visit | Attending: Surgery | Admitting: Surgery

## 2014-10-17 ENCOUNTER — Encounter: Payer: Self-pay | Admitting: Surgery

## 2014-10-17 ENCOUNTER — Ambulatory Visit (INDEPENDENT_AMBULATORY_CARE_PROVIDER_SITE_OTHER): Payer: Self-pay | Admitting: Surgery

## 2014-10-17 VITALS — BP 134/85 | HR 73 | Resp 16 | Ht 68.0 in | Wt 139.0 lb

## 2014-10-17 DIAGNOSIS — I712 Thoracic aortic aneurysm, without rupture: Secondary | ICD-10-CM

## 2014-10-17 DIAGNOSIS — I719 Aortic aneurysm of unspecified site, without rupture: Secondary | ICD-10-CM

## 2014-10-17 DIAGNOSIS — I7121 Aneurysm of the ascending aorta, without rupture: Secondary | ICD-10-CM

## 2014-10-17 DIAGNOSIS — Q231 Congenital insufficiency of aortic valve: Secondary | ICD-10-CM

## 2014-10-17 NOTE — Progress Notes (Signed)
       HPI:  Elizabeth Hayden returns for routine postoperative follow-up having undergone aortic valve sparing root replacement and replacement of the ascending aorta for a 5.5 cm enlarging aortic root and ascending aortic aneurysm on 09/03/2014. The Elizabeth Hayden's early postoperative recovery while in the hospital was notable for an uncomplicated postop course. Since hospital discharge the Elizabeth Hayden reports that she has been feeling well. Her only complaint is that her hair has been falling out which she had in the past related to Lopressor. She had stopped taking Lopressor in the past due to that.    Current Outpatient Prescriptions  Medication Sig Dispense Refill  . ascorbic acid (VITAMIN C) 250 MG CHEW Chew 250 mg by mouth daily.    Marland Kitchen. aspirin EC 325 MG EC tablet Take 1 tablet (325 mg total) by mouth daily. 30 tablet 0  . CALCIUM-VITAMIN D PO Take 2 tablets by mouth daily.      . FORFIVO XL 450 MG TB24 Take 1 tablet by mouth  daily 90 tablet 1  . ibuprofen (ADVIL,MOTRIN) 200 MG tablet Take 400 mg by mouth every 6 (six) hours as needed for headache.    . metoprolol tartrate (LOPRESSOR) 12.5 mg TABS tablet Take 0.5 tablets (12.5 mg total) by mouth 2 (two) times daily. 60 tablet 1  . Multiple Vitamin (MULTIVITAMIN) capsule Take 1 capsule by mouth daily.      . ranitidine (ZANTAC) 75 MG tablet Take 150 mg by mouth daily.     No current facility-administered medications for this visit.    Physical Exam: BP 134/85 mmHg  Pulse 73  Resp 16  Ht 5\' 8"  (1.727 m)  Wt 139 lb (63.05 kg)  BMI 21.14 kg/m2  SpO2 99% She looks well. Lung exam is clear. Cardiac exam shows a regular rate and rhythm with normal heart sounds. There is no murmur Chest incision is healing well and sternum is stable. There is no peripheral edema.   Diagnostic Tests:  CLINICAL DATA: Status post ascending aortic root replacement in November of 2015  EXAM: CHEST 2 VIEW  COMPARISON: Portable chest x-ray of September 05, 2014  FINDINGS: The lungs are well-expanded and clear. The heart and pulmonary vascularity are normal. The mediastinum is normal in width. There are 7 intact sternal wires. There is no pleural effusion or pneumothorax. The bony thorax is unremarkable.  IMPRESSION: There is no active cardiopulmonary disease.   Electronically Signed  By: David SwazilandJordan  On: 10/17/2014 12:19   Impression:  Overall she is making a great recovery following her surgery. I told her she can return to driving. 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 I think that is fine when she feels up to it since she does not do any physically strenuous work and can control her hours. I asked her not to lift anything heavier than 10 lbs for 3 months postop. If Lopressor is causing alopecia she should stop it. She can be placed on a different antihypertensive if needed.  Plan:  She will continue to follow up with Dr. Eden EmmsNishan and will need periodic echos to follow her bicuspid aortic valve that is working well at this time.

## 2014-10-19 ENCOUNTER — Ambulatory Visit (INDEPENDENT_AMBULATORY_CARE_PROVIDER_SITE_OTHER): Payer: 59 | Admitting: Family Medicine

## 2014-10-19 ENCOUNTER — Encounter: Payer: Self-pay | Admitting: Family Medicine

## 2014-10-19 VITALS — BP 118/62 | HR 106 | Temp 98.3°F | Ht 68.0 in | Wt 142.0 lb

## 2014-10-19 DIAGNOSIS — Z23 Encounter for immunization: Secondary | ICD-10-CM

## 2014-10-19 DIAGNOSIS — Z0000 Encounter for general adult medical examination without abnormal findings: Secondary | ICD-10-CM

## 2014-10-19 NOTE — Patient Instructions (Signed)
Labs today for wellness / from [facility]  Tdap vaccine today (10 year update) Take care of yourself - I'm glad you are doing well   Follow up for your yearly gyn exam

## 2014-10-19 NOTE — Progress Notes (Signed)
Pre visit review using our clinic review tool, if applicable. No additional management support is needed unless otherwise documented below in the visit note. 

## 2014-10-19 NOTE — Assessment & Plan Note (Signed)
Pt is doing well s/p aortic root repl surgery and getting back to normal  Reviewed health habits including diet and exercise and skin cancer prevention Reviewed appropriate screening tests for age  Also reviewed health mt list, fam hx and immunization status , as well as social and family history   Wellness lab today Update Tdap  Will have gyn exam and STD testing with her gyn

## 2014-10-19 NOTE — Progress Notes (Signed)
Subjective:    Patient ID: Elizabeth Hayden, female    DOB: 1971-01-09, 44 y.o.   MRN: [id number]  HPI Here for health maintenance exam and to review chronic medical problems     Just had aortic root replacement  Hx of congenital heart dz Feeling tired  Gets winded going up the stairs at home - will continue to improve   Is taking care of herself  Is working part time from home  Will get further cardiology f/u next week -exp to get released    Wt is up 3 lb with bmi of 21   HIV screen -has had it and will get at her gyn  She gets std screens at gyn due to partner who was had multiple partners   Mammogram 4/15 nl  Self exam -does not check regularly due to soreness from incision    Td 7/05 - will get that today  Flu vaccine 11/15 Will ask Dr Eden Emms if she should get pneumonia vaccines   Pap about a year ago - will make her own appt  Hx of hpv in the past     Chemistry      Component Value Date/Time   NA 137 09/21/2014 1046   NA 141 09/07/2014 0300   K 4.2 09/21/2014 1046   CL 99 09/21/2014 1046   CO2 26 09/21/2014 1046   BUN 9 09/21/2014 1046   BUN 8 09/07/2014 0300   CREATININE 0.68 09/21/2014 1046      Component Value Date/Time   CALCIUM 9.6 09/21/2014 1046   ALKPHOS 48 08/31/2014 1431   AST 22 08/31/2014 1431   ALT 18 08/31/2014 1431   BILITOT 0.6 08/31/2014 1431      Lab Results  Component Value Date   WBC 8.9 09/21/2014   HGB 11.4 09/21/2014   HCT 33.1* 09/21/2014   MCV 83 09/21/2014   PLT 615* 09/21/2014    Was anemic after surgery -has finished her iron    Due for labs - will do [facility]    Patient Active Problem List   Diagnosis Date Noted  . Ascending aortic aneurysm 09/03/2014  . Neck pain on left side 06/01/2014  . Other screening mammogram 07/25/2012  . Coarctation of aorta 11/24/2011  . Aortic aneurysm 11/24/2011  . Abnormal Pap smear and cervical HPV (human papillomavirus) 05/31/2011  . Gynecological examination 05/19/2011  .  Routine general medical examination at a health care facility 05/19/2011  . CERV HIGH RISK HUMAN PAPILLOMAVIRUS DNA TEST POS 11/14/2009  . History of eating disorder 11/02/2007  . DEPRESSION 11/02/2007  . CARDIOMYOPATHY, HYPERTROPHIC, OBSTRUCTIVE 11/02/2007  . Allergic rhinitis 11/02/2007  . BICUSPID AORTIC VALVE 11/02/2007   Past Medical History  Diagnosis Date  . TMJ (dislocation of temporomandibular joint)     Wears a night gear   . Congenital heart disease     bicuspid aortic valve with coarctation repair 04/1974  . Aortic root dilation   . Edema   . Fibrocystic breast   . HPV (human papilloma virus) infection   . Shortness of breath dyspnea     with exertion  . Sinus infection     Nov 16 and treated with antibiotic  . Seizures     as a baby and only 1 time d/t high fever  . GERD (gastroesophageal reflux disease)     takes Zantac daily as needed  . UTI (lower urinary tract infection)     hx of   . Yeast infection  08/17/14  . Depression    Past Surgical History  Procedure Laterality Date  . Coarctation of aorta excision  1975  . Eye surgery    . Tee without cardioversion N/A 08/06/2014    Procedure: TRANSESOPHAGEAL ECHOCARDIOGRAM (TEE);  Surgeon: Wendall Stade, MD;  Location: [facility] ENDOSCOPY;  Service: Cardiovascular;  Laterality: N/A;  . Eye surgery Bilateral 1985  . Ascending aortic root replacement N/A 09/03/2014    Procedure: ASCENDING AORTIC ROOT REPLACEMENT & VALVE SPARING ROOT;  Surgeon: Alleen Borne, MD;  Location: [facility] OR;  Service: Open Heart Surgery;  Laterality: N/A;  [facility] ARREST  . Intraoperative transesophageal echocardiogram N/A 09/03/2014    Procedure: INTRAOPERATIVE TRANSESOPHAGEAL ECHOCARDIOGRAM;  Surgeon: Alleen Borne, MD;  Location[facility];  Service: Open Heart Surgery;  Laterality: N/A;  . Left heart catheterization with coronary angiogram N/A 08/06/2014    Procedure: LEFT HEART CATHETERIZATION WITH CORONARY ANGIOGRAM;  Surgeon: Wendall Stade, MD;   [facility] C[facility] LAB;  Service: Cardiovascular;  Laterality: N/A;   History  Substance Use Topics  . Smoking status: Never Smoker   . Smokeless tobacco: Not on file  . Alcohol Use: 0.0 oz/week    0 Not specified per week     Comment: occasional   Family History  Problem Relation Age of Onset  . Hypertension Father   . Diabetes Father   . Cancer Father     lung   . Breast cancer Maternal Grandmother   . Leukemia Maternal Grandmother   . Ovarian cancer Paternal Grandmother    Allergies  Allergen Reactions  . Ace Inhibitors     REACTION: rash and cough  . Altace [Ramipril] Hives and Itching   Current Outpatient Prescriptions on File Prior to Visit  Medication Sig Dispense Refill  . ascorbic acid (VITAMIN C) 250 MG CHEW Chew 250 mg by mouth daily.    Marland Kitchen aspirin EC 325 MG EC tablet Take 1 tablet (325 mg total) by mouth daily. 30 tablet 0  . CALCIUM-VITAMIN D PO Take 2 tablets by mouth daily.      . FORFIVO XL 450 MG TB24 Take 1 tablet by mouth  daily 90 tablet 1  . ibuprofen (ADVIL,MOTRIN) 200 MG tablet Take 400 mg by mouth every 6 (six) hours as needed for headache.    . Multiple Vitamin (MULTIVITAMIN) capsule Take 1 capsule by mouth daily.      . ranitidine (ZANTAC) 75 MG tablet Take 150 mg by mouth daily.     No current facility-administered medications on file prior to visit.    Review of Systems    Review of Systems  Constitutional: Negative for fever, appetite change,  and unexpected weight change. pos for fatigue  Eyes: Negative for pain and visual disturbance.  Respiratory: Negative for cough and shortness of breath.  (pos for improving exercise tolerance) Cardiovascular: Negative for cp or palpitations    Gastrointestinal: Negative for nausea, diarrhea and constipation.  Genitourinary: Negative for urgency and frequency.  Skin: Negative for pallor or rash   Neurological: Negative for weakness, light-headedness, numbness and headaches.  Hematological: Negative for  adenopathy. Does not bruise/bleed easily.  Psychiatric/Behavioral: Negative for dysphoric mood. The patient is not nervous/anxious.      Objective:   Physical Exam  Constitutional: She appears well-developed and well-nourished. No distress.  Slim and well appearing   HENT:  Head: Normocephalic and atraumatic.  Right Ear: External ear normal.  Left Ear: External ear normal.  Nose: Nose normal.  Mouth/Throat:  Oropharynx is clear and moist.  Eyes: Conjunctivae and EOM are normal. Pupils are equal, round, and reactive to light. Right eye exhibits no discharge. Left eye exhibits no discharge. No scleral icterus.  Neck: Normal range of motion. Neck supple. No JVD present. No thyromegaly present.  Cardiovascular: Normal rate, regular rhythm and intact distal pulses.  Exam reveals no gallop.   Murmur heard. Pulmonary/Chest: Effort normal and breath sounds normal. No respiratory distress. She has no wheezes. She has no rales.  Sternotomy scar is healing    Abdominal: Soft. Bowel sounds are normal. She exhibits no distension and no mass. There is no tenderness.  Musculoskeletal: She exhibits no edema or tenderness.  Lymphadenopathy:    She has no cervical adenopathy.  Neurological: She is alert. She has normal reflexes. No cranial nerve deficit. She exhibits normal muscle tone. Coordination normal.  Skin: Skin is warm and dry. No rash noted. No erythema. No pallor.  Psychiatric: She has a normal mood and affect.          Assessment & Plan:   Problem List Items Addressed This Visit      Other   Routine general medical examination at a health care facility - Primary    Pt is doing well s/p aortic root repl surgery and getting back to normal  Reviewed health habits including diet and exercise and skin cancer prevention Reviewed appropriate screening tests for age  Also reviewed health mt list, fam hx and immunization status , as well as social and family history   Wellness lab  today Update Tdap  Will have gyn exam and STD testing with her gyn     Relevant Orders      CBC with Differential (Completed)      Comprehensive metabolic panel (Completed)      TSH (Completed)      Lipid panel (Completed)    Other Visit Diagnoses    Need for Tdap vaccination        Relevant Orders       Tdap vaccine greater than or equal to 7yo IM (Completed)

## 2014-10-20 LAB — COMPREHENSIVE METABOLIC PANEL
ALBUMIN: 4.3 g/dL (ref 3.5–5.5)
ALK PHOS: 70 IU/L (ref 39–117)
ALT: 9 IU/L (ref 0–32)
AST: 18 IU/L (ref 0–40)
Albumin/Globulin Ratio: 1.4 (ref 1.1–2.5)
BUN/Creatinine Ratio: 9 (ref 9–23)
BUN: 6 mg/dL (ref 6–24)
CALCIUM: 9.6 mg/dL (ref 8.7–10.2)
CHLORIDE: 97 mmol/L (ref 97–108)
CO2: 24 mmol/L (ref 18–29)
CREATININE: 0.66 mg/dL (ref 0.57–1.00)
GFR calc non Af Amer: 107 mL/min/{1.73_m2} (ref >59)
GFR, EST AFRICAN AMERICAN: 123 mL/min/{1.73_m2} (ref >59)
Globulin, Total: 3 g/dL (ref 1.5–4.5)
Glucose: 85 mg/dL (ref 65–99)
Potassium: 3.7 mmol/L (ref 3.5–5.2)
Sodium: 139 mmol/L (ref 134–144)
TOTAL PROTEIN: 7.3 g/dL (ref 6.0–8.5)
Total Bilirubin: 0.2 mg/dL (ref 0.0–1.2)

## 2014-10-20 LAB — CBC WITH DIFFERENTIAL/PLATELET
BASOS ABS: 0 10*3/uL (ref 0.0–0.2)
BASOS: 0 %
EOS ABS: 0.2 10*3/uL (ref 0.0–0.4)
EOS: 2 %
HCT: 38.1 % (ref 34.0–46.6)
Hemoglobin: 12.2 g/dL (ref 11.1–15.9)
IMMATURE GRANULOCYTES: 0 %
Immature Grans (Abs): 0 10*3/uL (ref 0.0–0.1)
LYMPHS ABS: 2.2 10*3/uL (ref 0.7–3.1)
Lymphs: 29 %
MCH: 28.4 pg (ref 26.6–33.0)
MCHC: 32 g/dL (ref 31.5–35.7)
MCV: 89 fL (ref 79–97)
Monocytes Absolute: 0.8 10*3/uL (ref 0.1–0.9)
Monocytes: 11 %
NEUTROS ABS: 4.3 10*3/uL (ref 1.4–7.0)
Neutrophils Relative %: 58 %
RBC: 4.29 x10E6/uL (ref 3.77–5.28)
RDW: 14.7 % (ref 12.3–15.4)
WBC: 7.5 10*3/uL (ref 3.4–10.8)

## 2014-10-20 LAB — LIPID PANEL
Chol/HDL Ratio: 2.8 ratio units (ref 0.0–4.4)
Cholesterol, Total: 189 mg/dL (ref 100–199)
HDL: 68 mg/dL (ref >39)
LDL Calculated: 107 mg/dL — ABNORMAL HIGH (ref 0–99)
Triglycerides: 68 mg/dL (ref 0–149)
VLDL Cholesterol Cal: 14 mg/dL (ref 5–40)

## 2014-10-20 LAB — TSH: TSH: 1.59 u[IU]/mL (ref 0.450–4.500)

## 2014-10-22 ENCOUNTER — Encounter: Payer: Self-pay | Admitting: *Deleted

## 2014-10-22 NOTE — Progress Notes (Signed)
Patient ID: Elizabeth Hayden, female   DOB: 04/28/1971, 44 y.o.   MRN: [id number] Elizabeth KaufmannMelissa returns today for followup. She has a history of bicuspid aortic valve with coarctation repair.. She has had a long standing history of  chronic edema, this tends to be generalizes body edema both in her legs, and her hands and in her face.    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 Valve-sparing aortic root replacement with reimplantation of the native aortic valve and left and right coronary arteries ( 28 mm Gelweave valsalva graft). 4. Replacement of ascending aortic aneurysm with a 28 mm Hemashield graft using deep hypothermic circulatory arrest.  Needs f/u of coarctation gradient and bicuspid valve since no AVR done     ROS: Denies fever, malais, weight loss, blurry vision, decreased visual acuity, cough, sputum, SOB, hemoptysis, pleuritic pain, palpitaitons, heartburn, abdominal pain, melena, lower extremity edema, claudication, or rash.  All other systems reviewed and negative  General: Affect appropriate Healthy:  appears stated age HEENT: normal Neck supple with no adenopathy JVP normal no bruits no thyromegaly Lungs clear with no wheezing and good diaphragmatic motion Heart:  S1/S2 SEM murmur, no rub, gallop or click PMI normal Abdomen: benighn, BS positve, no tenderness, no  AAA no bruit.  No HSM or HJR Distal pulses intact with no bruits No edema Neuro non-focal Skin warm and dry No muscular weakness   Current Outpatient Prescriptions  Medication Sig Dispense Refill  . ascorbic acid (VITAMIN C) 250 MG CHEW Chew 250 mg by mouth daily.    Marland Kitchen. aspirin EC 325 MG EC tablet Take 1 tablet (325 mg total) by mouth daily. 30 tablet 0  . CALCIUM-VITAMIN D PO Take 2 tablets by mouth daily.      . FORFIVO XL 450 MG TB24 Take 1 tablet by mouth  daily 90 tablet 1  . ibuprofen (ADVIL,MOTRIN) 200 MG tablet Take 400 mg by mouth every 6 (six) hours as needed for headache.    . Multiple Vitamin (MULTIVITAMIN) capsule Take 1 capsule by mouth daily.      . ranitidine (ZANTAC) 75 MG tablet Take 150 mg by mouth daily.     No current facility-administered medications for this visit.    Allergies  Ace inhibitors and Altace  Electrocardiogram:  SR LAFB  LAE  Rate 85   Assessment and Plan

## 2014-10-23 ENCOUNTER — Ambulatory Visit (INDEPENDENT_AMBULATORY_CARE_PROVIDER_SITE_OTHER): Payer: 59 | Admitting: Cardiovascular Disease

## 2014-10-23 ENCOUNTER — Encounter: Payer: Self-pay | Admitting: Cardiovascular Disease

## 2014-10-23 ENCOUNTER — Encounter: Payer: Self-pay | Admitting: *Deleted

## 2014-10-23 VITALS — BP 130/80 | HR 89 | Ht 68.0 in | Wt 141.1 lb

## 2014-10-23 DIAGNOSIS — Z8679 Personal history of other diseases of the circulatory system: Secondary | ICD-10-CM

## 2014-10-23 DIAGNOSIS — I719 Aortic aneurysm of unspecified site, without rupture: Secondary | ICD-10-CM

## 2014-10-23 DIAGNOSIS — Z9889 Other specified postprocedural states: Secondary | ICD-10-CM

## 2014-10-23 DIAGNOSIS — I359 Nonrheumatic aortic valve disorder, unspecified: Secondary | ICD-10-CM

## 2014-10-23 DIAGNOSIS — Q231 Congenital insufficiency of aortic valve: Secondary | ICD-10-CM

## 2014-10-23 DIAGNOSIS — Q251 Coarctation of aorta: Secondary | ICD-10-CM

## 2014-10-23 NOTE — Patient Instructions (Signed)

## 2014-10-23 NOTE — Assessment & Plan Note (Signed)
Post root replacement No complications from surgery Sternotomy healing well Will use mederma pm to minimize scarring

## 2014-10-23 NOTE — Assessment & Plan Note (Signed)
F/U echo post op  Valve spared due to young age and no significant stenosis/regurgitation

## 2014-10-23 NOTE — Assessment & Plan Note (Signed)
Suprasternal CW doppler with echo mild residual gradient No pulse deficit arm/legs CXR no rib notching

## 2014-10-31 ENCOUNTER — Ambulatory Visit (HOSPITAL_COMMUNITY): Payer: 59 | Attending: Cardiovascular Disease | Admitting: Radiology

## 2014-10-31 DIAGNOSIS — Q251 Coarctation of aorta: Secondary | ICD-10-CM

## 2014-10-31 DIAGNOSIS — I359 Nonrheumatic aortic valve disorder, unspecified: Secondary | ICD-10-CM | POA: Insufficient documentation

## 2014-10-31 DIAGNOSIS — Q231 Congenital insufficiency of aortic valve: Secondary | ICD-10-CM

## 2014-10-31 NOTE — Progress Notes (Signed)
Echocardiogram performed.  

## 2014-11-08 ENCOUNTER — Other Ambulatory Visit: Payer: Self-pay | Admitting: Family Medicine

## 2014-11-08 NOTE — Telephone Encounter (Signed)
It is another version of buproprion  Please refill for a year

## 2014-11-08 NOTE — Telephone Encounter (Signed)
Electronic refill request. Last Filled:    90 tablet 1 Rf on 07/23/2014  Please advise.  Patient was just here for CPE on 10/27/14.   I am not familiar with this medication and not sure if I can refill.

## 2015-02-06 ENCOUNTER — Other Ambulatory Visit: Payer: Self-pay

## 2015-02-06 DIAGNOSIS — Z1231 Encounter for screening mammogram for malignant neoplasm of breast: Secondary | ICD-10-CM

## 2015-02-27 ENCOUNTER — Ambulatory Visit
Admission: RE | Admit: 2015-02-27 | Discharge: 2015-02-27 | Disposition: A | Discharge status: Home / Self Care | Payer: 59 | Source: Ambulatory Visit

## 2015-02-27 DIAGNOSIS — Z1231 Encounter for screening mammogram for malignant neoplasm of breast: Secondary | ICD-10-CM

## 2015-02-28 ENCOUNTER — Other Ambulatory Visit: Payer: Self-pay | Admitting: Family Medicine

## 2015-02-28 DIAGNOSIS — R928 Other abnormal and inconclusive findings on diagnostic imaging of breast: Secondary | ICD-10-CM

## 2015-03-06 ENCOUNTER — Ambulatory Visit
Admission: RE | Admit: 2015-03-06 | Discharge: 2015-03-06 | Disposition: A | Discharge status: Home / Self Care | Payer: 59 | Source: Ambulatory Visit | Attending: Family Medicine | Admitting: Family Medicine

## 2015-03-06 ENCOUNTER — Other Ambulatory Visit: Payer: Self-pay | Admitting: Family Medicine

## 2015-03-06 DIAGNOSIS — R928 Other abnormal and inconclusive findings on diagnostic imaging of breast: Secondary | ICD-10-CM

## 2015-03-07 ENCOUNTER — Other Ambulatory Visit: Payer: Self-pay | Admitting: Family Medicine

## 2015-03-07 DIAGNOSIS — R928 Other abnormal and inconclusive findings on diagnostic imaging of breast: Secondary | ICD-10-CM

## 2015-03-14 ENCOUNTER — Ambulatory Visit
Admission: RE | Admit: 2015-03-14 | Discharge: 2015-03-14 | Disposition: A | Discharge status: Home / Self Care | Payer: 59 | Source: Ambulatory Visit | Attending: Family Medicine | Admitting: Family Medicine

## 2015-03-14 DIAGNOSIS — R928 Other abnormal and inconclusive findings on diagnostic imaging of breast: Secondary | ICD-10-CM

## 2015-03-15 ENCOUNTER — Telehealth: Payer: Self-pay | Admitting: Cardiovascular Disease

## 2015-03-15 NOTE — Telephone Encounter (Signed)
Pt called as she is having a MRI of the breast for for abnormal mammogram.  Pt is worried that wiring from previous AAA will be a problem.  Pt instructed to call Dr. Sharee PimpleBartle's office to verify that is is not a problem. Pt agreed no additional questions at this time.

## 2015-03-15 NOTE — Telephone Encounter (Signed)
New message         Pt wants to know if it is ok for her to have a MRI   Ok to leave vm on work or cell phone

## 2015-03-18 ENCOUNTER — Other Ambulatory Visit: Payer: Self-pay | Admitting: Family Medicine

## 2015-03-18 DIAGNOSIS — N6489 Other specified disorders of breast: Secondary | ICD-10-CM

## 2015-03-31 ENCOUNTER — Ambulatory Visit
Admission: RE | Admit: 2015-03-31 | Discharge: 2015-03-31 | Disposition: A | Discharge status: Home / Self Care | Payer: 59 | Source: Ambulatory Visit | Attending: Family Medicine | Admitting: Family Medicine

## 2015-03-31 DIAGNOSIS — N6489 Other specified disorders of breast: Secondary | ICD-10-CM

## 2015-03-31 MED ORDER — GADOBENATE DIMEGLUMINE 529 MG/ML IV SOLN
12.0000 mL | Freq: Once | INTRAVENOUS | Status: AC | PRN
  Administered 2015-03-31: 12 mL via INTRAVENOUS

## 2015-04-01 ENCOUNTER — Telehealth: Payer: Self-pay

## 2015-04-01 NOTE — Telephone Encounter (Signed)
Pt left v/m requesting cb when MRI of lt breast is available with results.

## 2015-04-01 NOTE — Telephone Encounter (Signed)
I just commented -see result note

## 2015-04-19 NOTE — Progress Notes (Signed)
Patient ID: Elizabeth Hayden, female   DOB: 05/15/1971, 44 y.o.   MRN: [id number] Elizabeth Hayden returns today for followup. She has a history of bicuspid aortic valve with coarctation repair.. She has had a long standing history of  chronic edema, this tends to be generalizes body edema both in her legs, and her hands and in her face.    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 Valve-sparing aortic root replacement with reimplantation of the native aortic valve and left and right coronary arteries ( 28 mm Gelweave valsalva graft). 4. Replacement of ascending aortic aneurysm with a 28 mm Hemashield graft using deep hypothermic circulatory arrest.  Echo  10/31/14  Reviewed and normal EF no significant AR/AS and no coarctation gradient   Study Conclusions  - Left ventricle: The cavity size was normal. Wall thickness was normal. Systolic function was normal. The estimated ejection fraction was in the range of 55% to 60%. - Aortic valve: Not well seen but known to be bicuspid with valve sparing surgery no signficiant AS/AR. Valve area (VTI): 1.73 cm^2. Valve area (Vmax): 1.66 cm^2. Valve area (Vmean): 1.63 cm^2. - Aorta: Normal appearance of root replacement with no residual aneurysm. - Mitral valve: Valve area by pressure half-time: 2.47 cm^2. Valve area by continuity equation  (using LVOT flow): 1.72 cm^2. - Atrial septum: No defect or patent foramen ovale was identified. - Impressions: Previous coarctation repair. No velocities recorded over 4113m/sec on suprasternal notch images of the descending thoracic aorta  Impressions:  - Previous coarctation repair. No velocities recorded over 5113m/sec on suprasternal notch images of the descending thoracic aorta  Daughter Trula OreChristina doing forensics still at [employer] and in color guard  ROS: Denies fever, malais, weight loss, blurry vision, decreased visual acuity, cough, sputum, SOB, hemoptysis, pleuritic pain, palpitaitons, heartburn, abdominal pain, melena, lower extremity edema, claudication, or rash.  All other systems reviewed and negative  General: Affect appropriate Healthy:  appears stated age HEENT: normal Neck supple with no adenopathy JVP normal no bruits no thyromegaly Lungs clear with no wheezing and good diaphragmatic motion Heart:  S1/S2 SEM murmur, no rub, gallop or click PMI normal Abdomen: benighn, BS positve, no tenderness, no AAA no bruit.  No HSM or HJR Distal pulses intact with no bruits No edema Neuro non-focal Skin warm and dry No muscular weakness   Current Outpatient Prescriptions  Medication Sig Dispense Refill  . aspirin EC 325 MG EC tablet Take 1 tablet (325 mg total) by mouth daily. 30 tablet 0  . CALCIUM-VITAMIN D PO Take 2 tablets by mouth daily.      . FORFIVO XL 450 MG TB24 Take 1 tablet by mouth  daily 90 tablet 3  . ibuprofen (ADVIL,MOTRIN) 200 MG tablet Take 400 mg by mouth every 6 (six) hours as needed for headache.    . Multiple Vitamin (MULTIVITAMIN)  capsule Take 1 capsule by mouth daily.      . ranitidine (ZANTAC) 75 MG tablet Take 150 mg by mouth daily.     No current facility-administered medications for this visit.    Allergies  Ace inhibitors and Altace  Electrocardiogram:   09/21/14 SR LAFB  LAE  Rate 85   Assessment and Plan Aortic  Aneurysm:  Post root replacement BP under good control Bicuspid AV:  Valve sparing surgery no significant AR/AS  SBE given gortex root Abberant Left Sublavian:  No evidence of impingement normal UE pulses  No tests.  F/U in a year

## 2015-04-22 ENCOUNTER — Encounter: Payer: Self-pay | Admitting: Cardiovascular Disease

## 2015-04-22 ENCOUNTER — Ambulatory Visit (INDEPENDENT_AMBULATORY_CARE_PROVIDER_SITE_OTHER): Payer: 59 | Admitting: Cardiovascular Disease

## 2015-04-22 VITALS — BP 114/68 | HR 90 | Ht 68.0 in | Wt 141.0 lb

## 2015-04-22 DIAGNOSIS — I718 Aortic aneurysm of unspecified site, ruptured: Secondary | ICD-10-CM

## 2015-04-22 NOTE — Patient Instructions (Signed)
Medication Instructions:  NO CHANGES  Labwork: NONE  Testing/Procedures: NONE  Follow-Up: Your physician wants you to follow-up in: YEAR WITH  DR NISHAN You will receive a reminder letter in the mail two months in advance. If you don't receive a letter, please call our office to schedule the follow-up appointment.   Any Other Special Instructions Will Be Listed Below (If Applicable).   

## 2015-04-26 HISTORY — PX: COLPOSCOPY: SHX161

## 2015-05-06 ENCOUNTER — Telehealth: Payer: Self-pay | Admitting: Cardiovascular Disease

## 2015-05-06 NOTE — Telephone Encounter (Signed)
LM TO CALL BACK ./CY 

## 2015-05-06 NOTE — Telephone Encounter (Signed)
New Message    Pt wants Rn to call her so that she can ask questions pertaining to her taking birth   Control pills, she wants to know if they are safe

## 2015-05-06 NOTE — Telephone Encounter (Signed)
PT  CALLED  WANTING  TO KNOW  IF  CAN  TAKE  BIRTH CONTROL AND  ALSO  WANTED  TO   KNOW  IF   COULD STOP TAKING  ASA PT  AWARE  WILL FORWARD TO DR Eden Emms  FOR  REVIEW  .Elizabeth Hayden

## 2015-05-06 NOTE — Telephone Encounter (Signed)
Pt returning Christine's phone call. Continue w/ Work #, please use Mobile # after 3:30 pm. Please call back and discuss.

## 2015-05-06 NOTE — Telephone Encounter (Signed)
See no reason why she could not take BCP

## 2015-05-08 NOTE — Telephone Encounter (Signed)
PT  NOTIFIED   ALSO  ASKED IF  COULD STOP BABY  ASA  PER DR Eden Emms MAY  DISCONTINUE

## 2015-05-13 ENCOUNTER — Telehealth: Payer: Self-pay

## 2015-05-13 MED ORDER — NORETHIN-ETH ESTRAD TRIPHASIC 0.5/0.75/1-35 MG-MCG PO TABS: 1.0000 | ORAL_TABLET | Freq: Every day | ORAL | Status: DC

## 2015-05-13 NOTE — Telephone Encounter (Signed)
Is there an oral contraceptive she has liked/been on before or any she wants to stay away from ?

## 2015-05-13 NOTE — Telephone Encounter (Signed)
Ortho novum 777 is fine  Let's start with the local px -I don't want to send it for mail order until she sees how she does with it - let me know after 2-4 weeks how she feels  I will send it electronically

## 2015-05-13 NOTE — Telephone Encounter (Signed)
Pt left v/m requesting to start BC pills; pt had colposcopy at [facility] (pt thinks Dr Milinda Antis has these results) and cardiologist, Dr Theodoro Clock and there is no problem with pt taking BC pills. Pt seen for annual exam on 10/19/14.Pt request cb.

## 2015-05-13 NOTE — Telephone Encounter (Signed)
Pt said she hasn't been on birth control pills for over 22 years. The last time she was on them she was prescribed ortho novum 777 and it did work well but she is willing to try what ever you prescribe. Pt is requesting a 30 day supply sent to [facility] [address] And then the 3 month supply sent to [facility] mail order

## 2015-05-14 NOTE — Telephone Encounter (Signed)
Patient notified as instructed by telephone and verbalized understanding. 

## 2015-05-16 IMAGING — CR DG CHEST 2V
2 series · 2 of 2 positions shown · non-contrast
Comparison: Five/19/0 find

CLINICAL DATA: Pre-admission for ascending aortic aneurysm

EXAM:
CHEST  2 VIEW

[w chest pa]
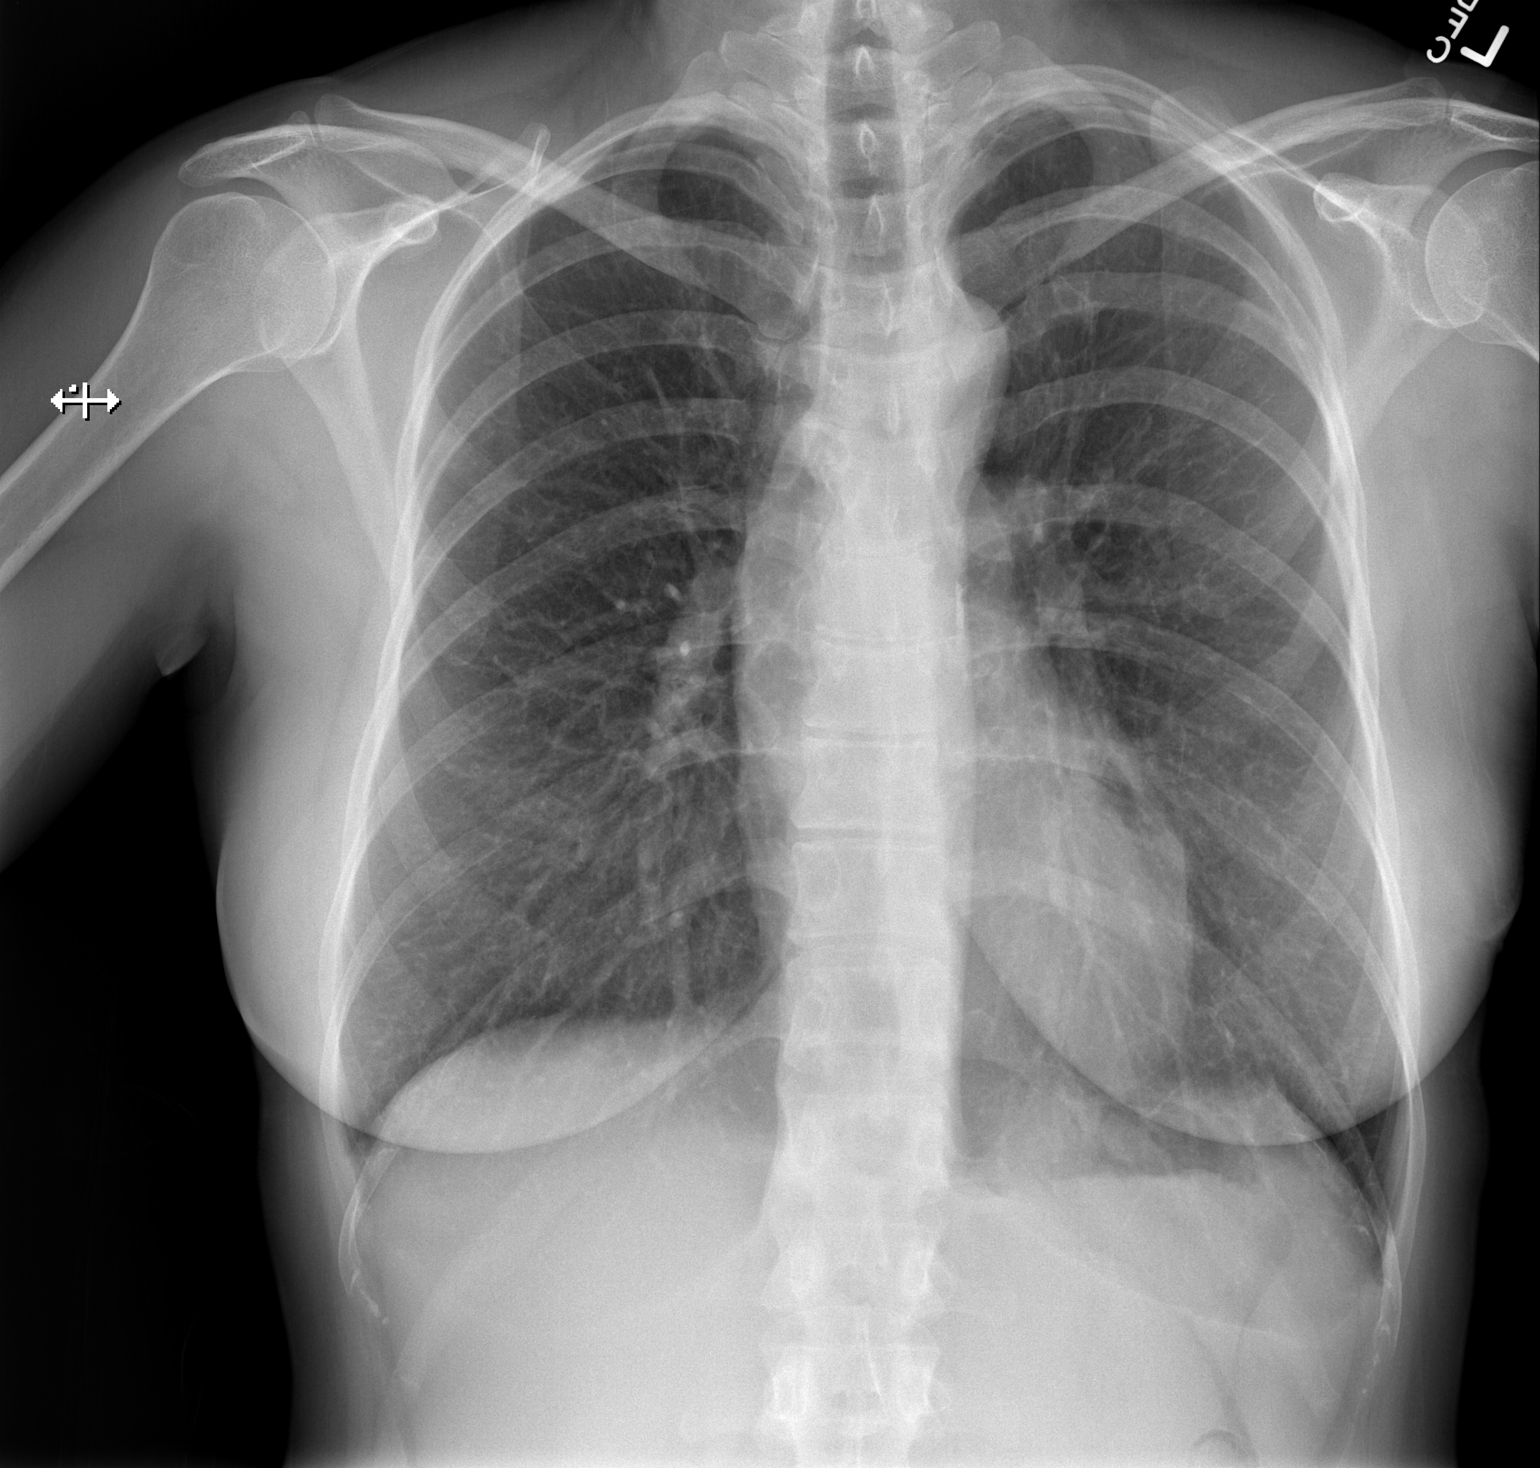

[w chest lat]
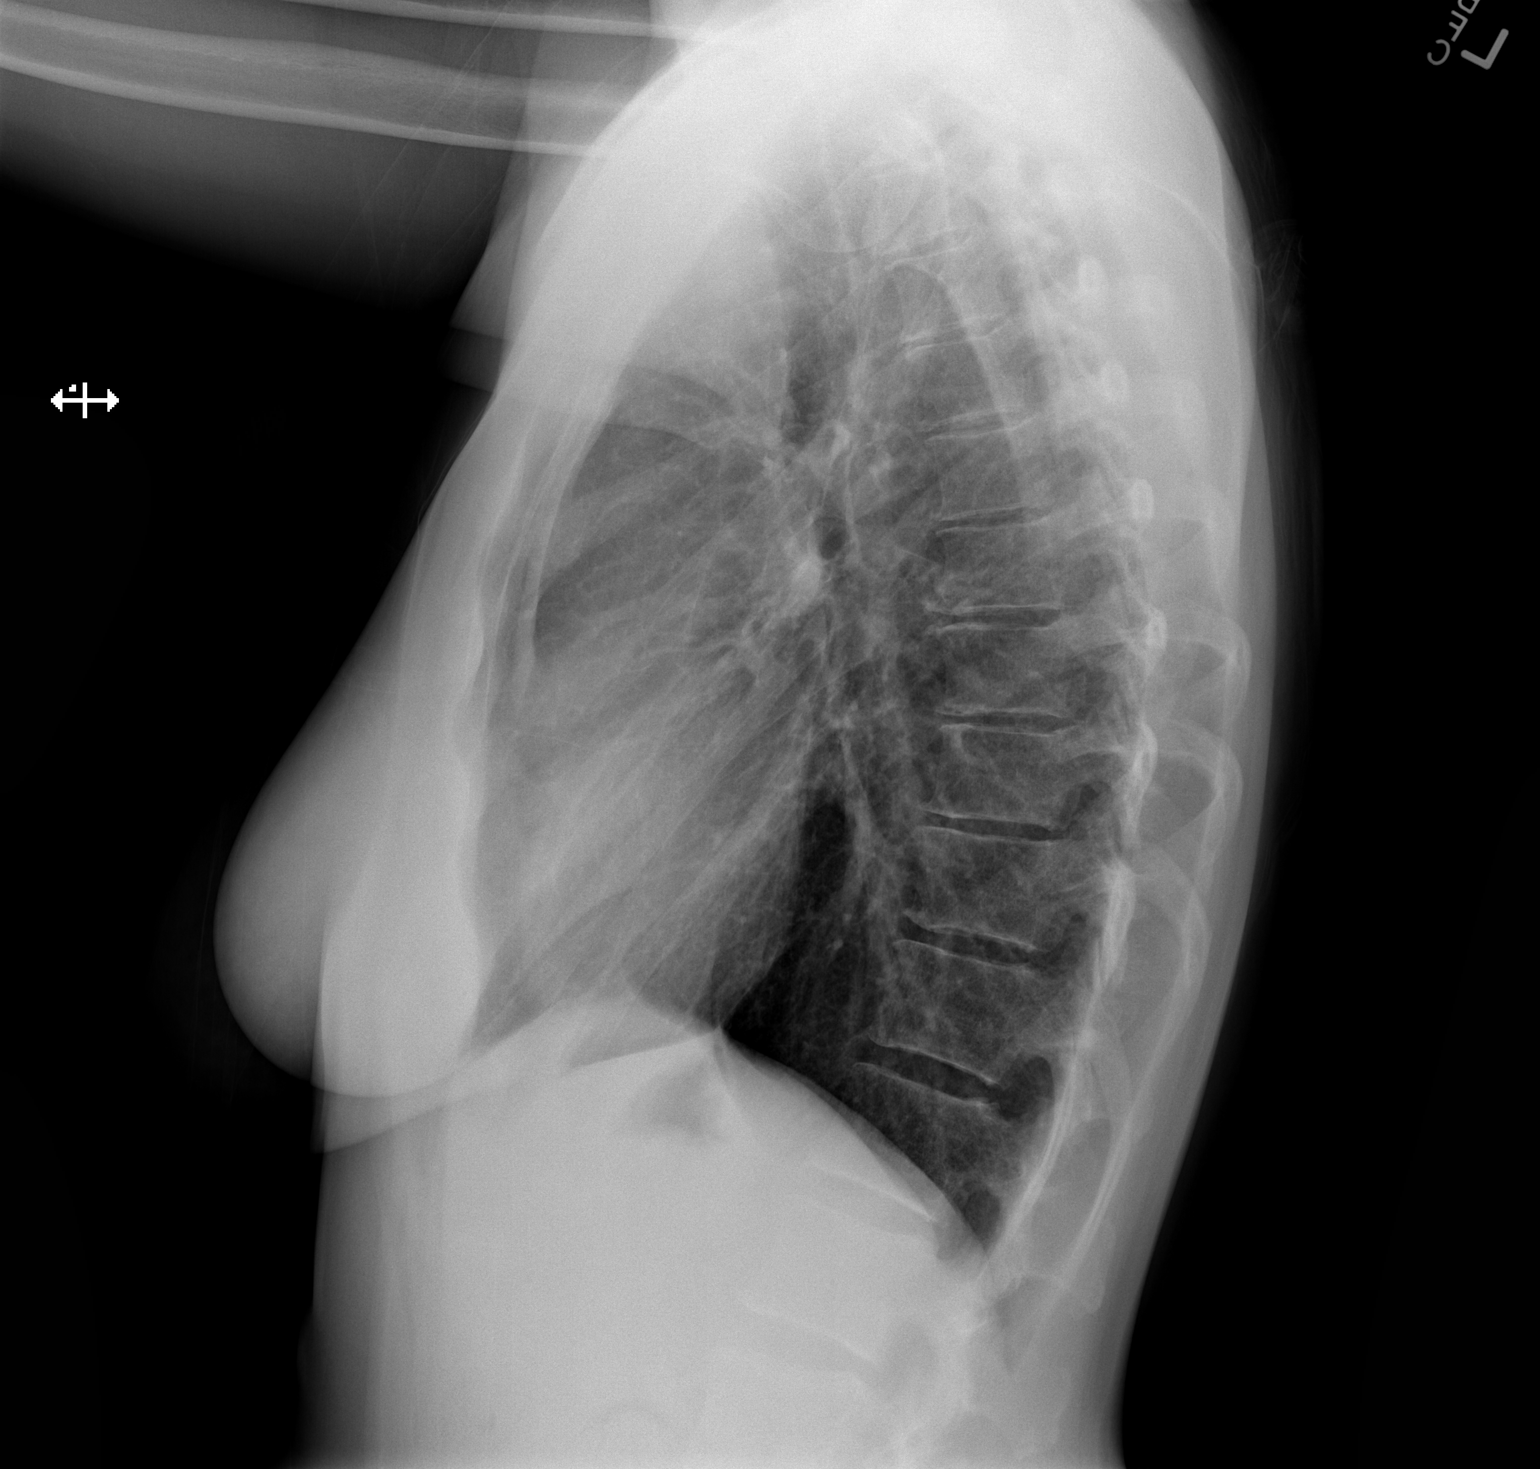

[2 of 2 positions shown; findings below may reference images not displayed]

FINDINGS: Cardiomediastinal silhouette is unremarkable. Again noted prominent
ascending aorta. No acute infiltrate or pleural effusion. No
pulmonary edema. Bony thorax is unremarkable.
IMPRESSION: No active cardiopulmonary disease. Again noted prominent ascending
aorta.

## 2015-06-11 MED ORDER — NORETHIN-ETH ESTRAD TRIPHASIC 0.5/0.75/1-35 MG-MCG PO TABS: 1.0000 | ORAL_TABLET | Freq: Every day | ORAL | Status: DC

## 2015-06-11 NOTE — Telephone Encounter (Signed)
Will refill electronically  

## 2015-06-11 NOTE — Telephone Encounter (Signed)
Pt called back, initially emotions were all over the place; but the emotions have settled down and the menstrual cramps and flow are lessened which pt is happy about. Pt request ortho novum 777 sent to [facility]. Please advise.

## 2015-06-11 NOTE — Addendum Note (Signed)
Addended by: Roxy Manns A on: 06/11/2015 04:47 PM   Modules accepted: Orders

## 2015-06-12 NOTE — Telephone Encounter (Signed)
Left voicemail letting pt know Rx sent to mail order pharmacy

## 2015-07-02 IMAGING — CR DG CHEST 2V
2 series · 2 of 2 positions shown · non-contrast
Comparison: Portable chest x-ray September 05, 2014

CLINICAL DATA: Status post ascending aortic root replacement in
Tuesday August, 2014

EXAM:
CHEST  2 VIEW

[w chest pa]
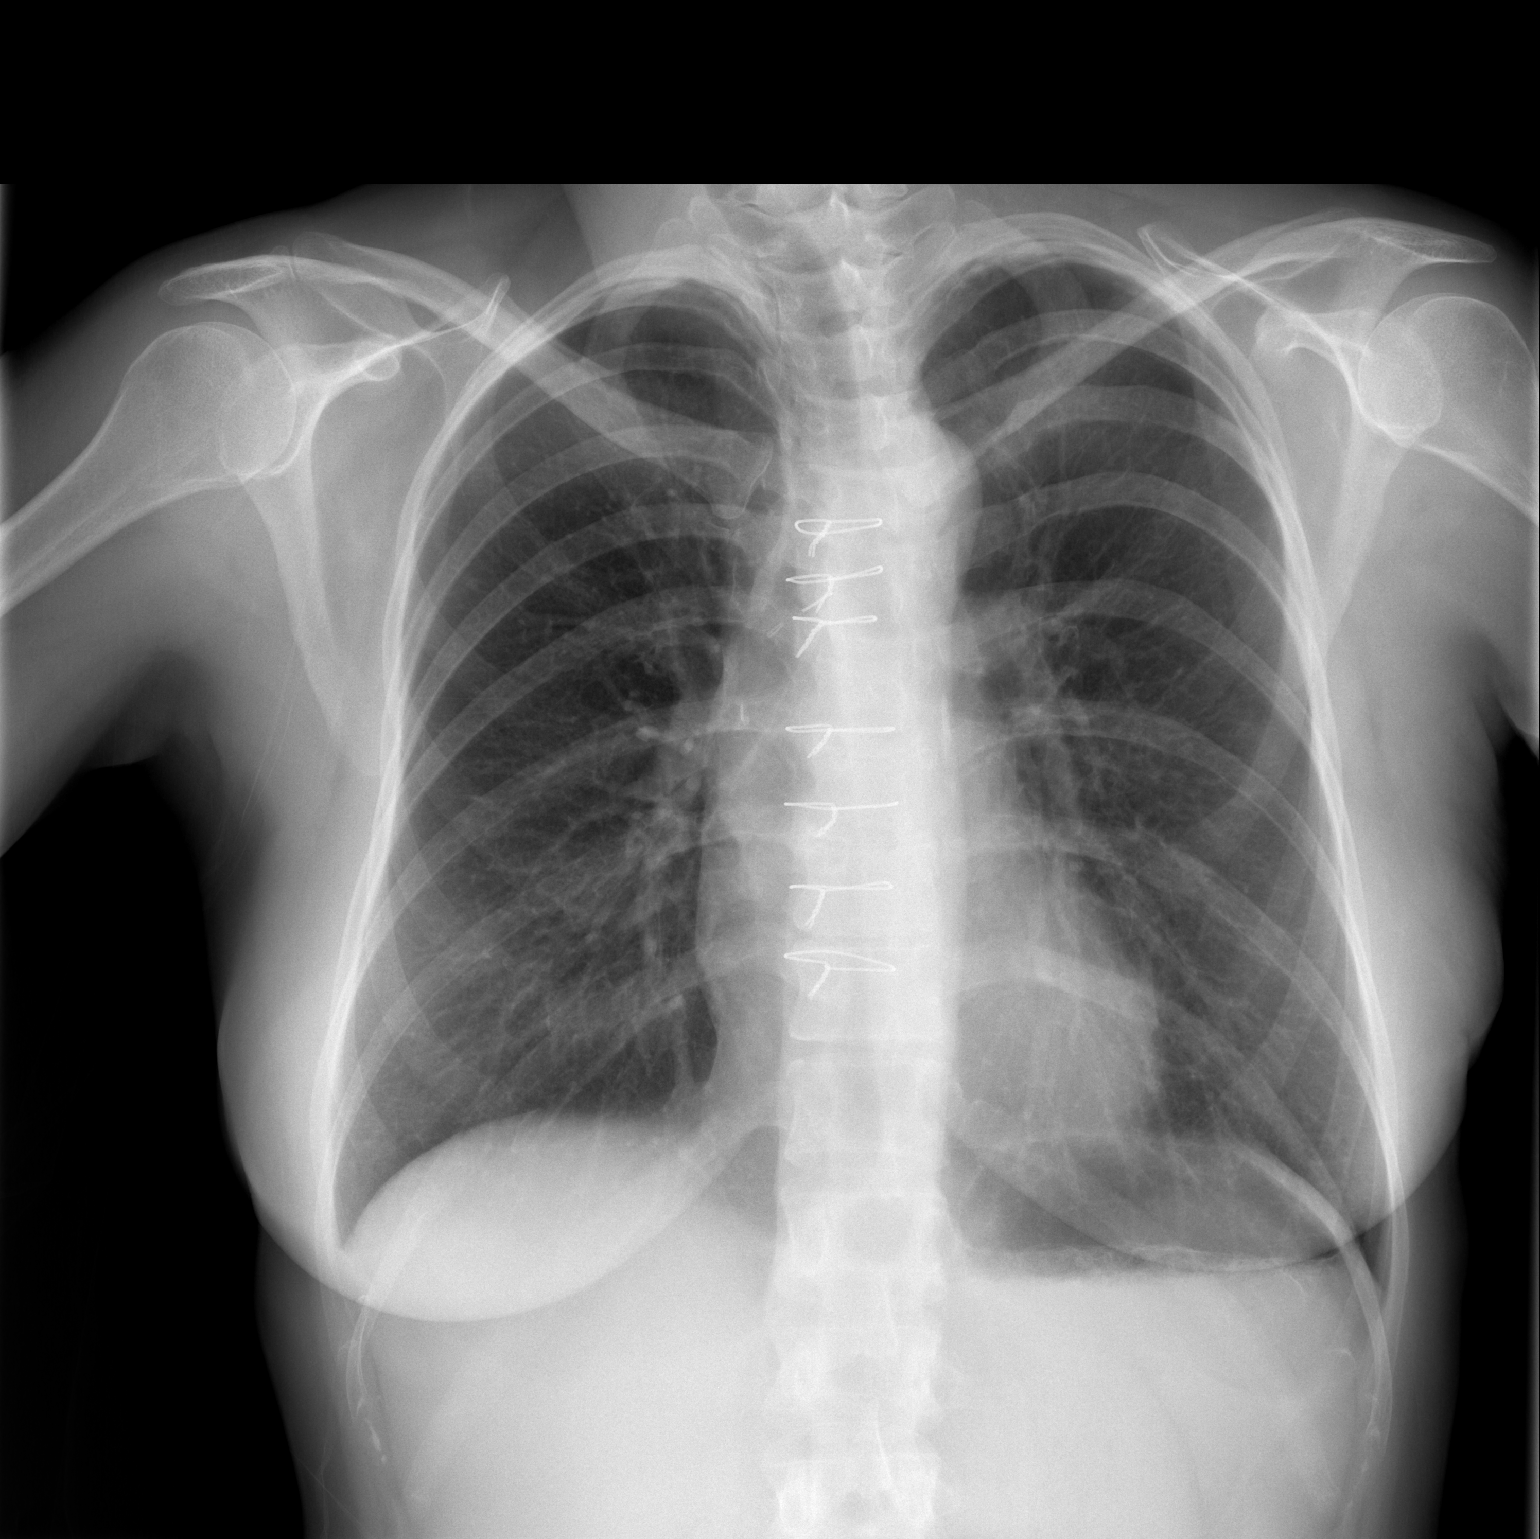

[w chest lat]
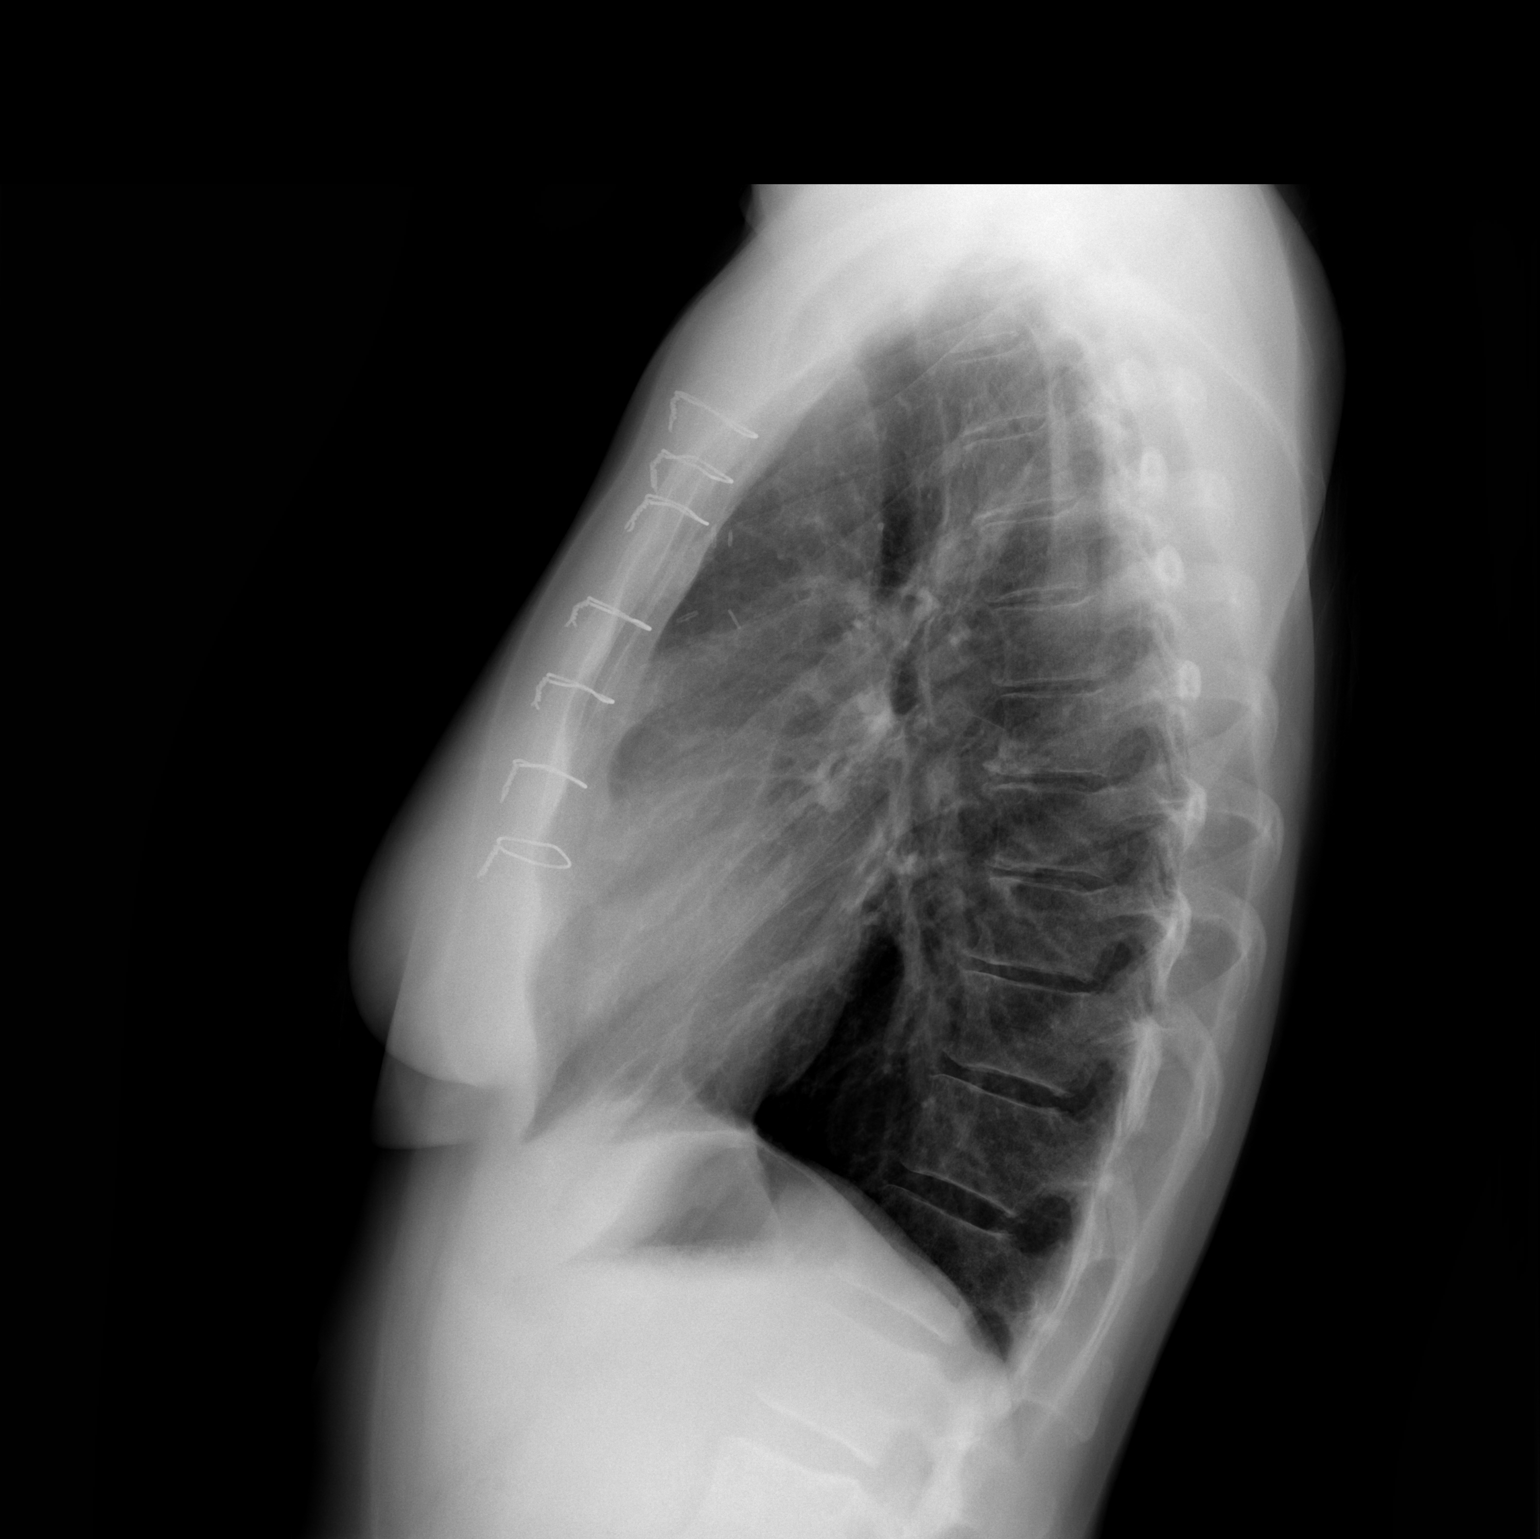

[2 of 2 positions shown; findings below may reference images not displayed]

FINDINGS: The lungs are well-expanded and clear. The heart and pulmonary
vascularity are normal. The mediastinum is normal in width. There
are 7 intact sternal wires. There is no pleural effusion or
pneumothorax. The bony thorax is unremarkable.
IMPRESSION: There is no active cardiopulmonary disease.

## 2015-11-17 ENCOUNTER — Other Ambulatory Visit: Payer: Self-pay | Admitting: Family Medicine

## 2015-11-18 NOTE — Telephone Encounter (Signed)
Please refill until appt  Thanks  

## 2015-11-18 NOTE — Telephone Encounter (Signed)
done

## 2015-11-18 NOTE — Telephone Encounter (Signed)
Received refill request electronically Last refill 11/09/14 #90/3 Last office visit 10/19/14 Has upcoming appointment 04/21/16 Is it okay to refill?

## 2016-02-24 LAB — HM PAP SMEAR: HM Pap smear: NORMAL

## 2016-04-14 ENCOUNTER — Other Ambulatory Visit: Payer: Self-pay | Admitting: Family Medicine

## 2016-04-21 ENCOUNTER — Ambulatory Visit (INDEPENDENT_AMBULATORY_CARE_PROVIDER_SITE_OTHER): Payer: 59 | Admitting: Family Medicine

## 2016-04-21 ENCOUNTER — Encounter: Payer: Self-pay | Admitting: Family Medicine

## 2016-04-21 VITALS — BP 138/80 | HR 72 | Temp 98.7°F | Ht 68.0 in | Wt 149.5 lb

## 2016-04-21 DIAGNOSIS — N6001 Solitary cyst of right breast: Secondary | ICD-10-CM | POA: Insufficient documentation

## 2016-04-21 DIAGNOSIS — I712 Thoracic aortic aneurysm, without rupture: Secondary | ICD-10-CM

## 2016-04-21 DIAGNOSIS — I7121 Aneurysm of the ascending aorta, without rupture: Secondary | ICD-10-CM

## 2016-04-21 DIAGNOSIS — Z0000 Encounter for general adult medical examination without abnormal findings: Secondary | ICD-10-CM

## 2016-04-21 DIAGNOSIS — Z0001 Encounter for general adult medical examination with abnormal findings: Secondary | ICD-10-CM | POA: Diagnosis not present

## 2016-04-21 DIAGNOSIS — Z114 Encounter for screening for human immunodeficiency virus [HIV]: Secondary | ICD-10-CM

## 2016-04-21 DIAGNOSIS — N6002 Solitary cyst of left breast: Secondary | ICD-10-CM | POA: Diagnosis not present

## 2016-04-21 NOTE — Progress Notes (Signed)
Pre visit review using our clinic review tool, if applicable. No additional management support is needed unless otherwise documented below in the visit note. 

## 2016-04-21 NOTE — Progress Notes (Signed)
Subjective:    Patient ID: Elizabeth Hayden, female    DOB: 08/02/1971, 45 y.o.   MRN: [id number]  HPI Here for health maintenance exam and to review chronic medical problems    Doing ok  Busy summer  Daughter graduated at [id number]  - working 3rd shift at [employer]   Wt is up 8 lb with bmi of 22 (pt is not happy with this but denies any disordered eating at this time)- has hx of eating disorder  Does not get enough rest -wakes up multiple times  Avoids caffeine after lunch  Walks for exercise - and push mows her lawn and runs up and down stairs at work and home -chooses more active lifestyle   Mammogram 5/16- cysts and MRI- then rec dx mammogram of L breast in 6 mo She never rec anything in the mail  She goes to the breast center (also never got the result of MRI either) Self breast exam-no changes/always lumpy Breast cancer in [facility]  HIV screen Would be ok with screening with lab today   Pap - just had one in May- with gyn /still has HPV but pap is normal   Td 1/16   Heart issues - hx of aortic root repl Fairly stable-no new issues (occ neck pain in area of prev central line)  BP Readings from Last 3 Encounters:  04/21/16 138/80  04/22/15 114/68  10/23/14 130/80    Due for annual wellness labs today  . Patient Active Problem List   Diagnosis Date Noted  . Bilateral breast cysts 04/21/2016  . Screening for HIV (human immunodeficiency virus) 04/21/2016  . Ascending aortic aneurysm (HCC) 09/03/2014  . Neck pain on left side 06/01/2014  . Other screening mammogram 07/25/2012  . Coarctation of aorta 11/24/2011  . Aortic aneurysm (HCC) 11/24/2011  . Abnormal Pap smear and cervical HPV (human papillomavirus) 05/31/2011  . Gynecological examination 05/19/2011  . Routine general medical examination at a health care facility 05/19/2011  . CERV HIGH RISK HUMAN PAPILLOMAVIRUS DNA TEST POS 11/14/2009  . History of eating disorder 11/02/2007  . DEPRESSION 11/02/2007  . CARDIOMYOPATHY,  HYPERTROPHIC, OBSTRUCTIVE 11/02/2007  . Allergic rhinitis 11/02/2007  . BICUSPID AORTIC VALVE 11/02/2007   Past Medical History  Diagnosis Date  . TMJ (dislocation of temporomandibular joint)     Wears a night gear   . Congenital heart disease     bicuspid aortic valve with coarctation repair 04/1974  . Aortic root dilation (HCC)   . Edema   . Fibrocystic breast   . HPV (human papilloma virus) infection   . Shortness of breath dyspnea     with exertion  . Sinus infection     Nov 16 and treated with antibiotic  . Seizures (HCC)     as a baby and only 1 time d/t high fever  . GERD (gastroesophageal reflux disease)     takes Zantac daily as needed  . UTI (lower urinary tract infection)     hx of   . Yeast infection     08/17/14  . Depression    Past Surgical History  Procedure Laterality Date  . Coarctation of aorta excision  1975  . Eye surgery    . Tee without cardioversion N/A 08/06/2014    Procedure: TRANSESOPHAGEAL ECHOCARDIOGRAM (TEE);  Surgeon: Wendall StadePeter C Nishan, MD;  [facility];  Service: Cardiovascular;  Laterality: N/A;  . Eye surgery Bilateral 1985  . Ascending aortic root replacement N/A 09/03/2014  Procedure: ASCENDING AORTIC ROOT REPLACEMENT & VALVE SPARING ROOT;  Surgeon: Alleen Borne, MD;  Location: [facility] OR;  Service: Open Heart Surgery;  Laterality: N/A;  [facility] ARREST  . Intraoperative transesophageal echocardiogram N/A 09/03/2014    Procedure: INTRAOPERATIVE TRANSESOPHAGEAL ECHOCARDIOGRAM;  Surgeon: Alleen Borne, MD;  Location: [facility] OR;  Service: Open Heart Surgery;  Laterality: N/A;  . Left heart catheterization with coronary angiogram N/A 08/06/2014    Procedure: LEFT HEART CATHETERIZATION WITH CORONARY ANGIOGRAM;  Surgeon: Wendall Stade, MD;  Location: [facility] CATH LAB;  Service: Cardiovascular;  Laterality: N/A;   Social History  Substance Use Topics  . Smoking status: Never Smoker   . Smokeless tobacco: None  . Alcohol Use: 0.0 oz/week    0  Standard drinks or equivalent per week     Comment: occasional   Family History  Problem Relation Age of Onset  . Hypertension Father   . Diabetes Father   . Cancer Father     lung   . Breast cancer Maternal Grandmother   . Leukemia Maternal Grandmother   . Ovarian cancer Paternal Grandmother    Allergies  Allergen Reactions  . Ace Inhibitors     REACTION: rash and cough  . Altace [Ramipril] Hives and Itching   Current Outpatient Prescriptions on File Prior to Visit  Medication Sig Dispense Refill  . CALCIUM-VITAMIN D PO Take 2 tablets by mouth daily.      . FORFIVO XL 450 MG TB24 Take 1 tablet by mouth  daily 90 tablet 0  . ibuprofen (ADVIL,MOTRIN) 200 MG tablet Take 400 mg by mouth every 6 (six) hours as needed for headache.    . Multiple Vitamin (MULTIVITAMIN) capsule Take 1 capsule by mouth daily.      . norethindrone-ethinyl estradiol (CYCLAFEM,ALYACEN) 0.5/0.75/1-35 MG-MCG tablet Take 1 tablet by mouth daily. 3 Package 3  . ranitidine (ZANTAC) 75 MG tablet Take 150 mg by mouth daily.     No current facility-administered medications on file prior to visit.    Review of Systems Review of Systems  Constitutional: Negative for fever, appetite change, fatigue and unexpected weight change.  Eyes: Negative for pain and visual disturbance.  Respiratory: Negative for cough and shortness of breath.   Cardiovascular: Negative for cp or palpitations    Gastrointestinal: Negative for nausea, diarrhea and constipation.  Genitourinary: Negative for urgency and frequency.  Skin: Negative for pallor or rash   Neurological: Negative for weakness, light-headedness, numbness and headaches.  Hematological: Negative for adenopathy. Does not bruise/bleed easily.  Psychiatric/Behavioral: Negative for dysphoric mood. The patient is not nervous/anxious.  pos for sleep difficulty        Objective:   Physical Exam  Constitutional: She appears well-developed and well-nourished. No distress.    Well appearing   HENT:  Head: Normocephalic and atraumatic.  Right Ear: External ear normal.  Left Ear: External ear normal.  Mouth/Throat: Oropharynx is clear and moist.  Eyes: Conjunctivae and EOM are normal. Pupils are equal, round, and reactive to light. No scleral icterus.  Neck: Normal range of motion. Neck supple. No JVD present. Carotid bruit is not present. No thyromegaly present.  Cardiovascular: Normal rate, regular rhythm and intact distal pulses.  Exam reveals no gallop.   Murmur heard. Systolic M stable  Pulmonary/Chest: Effort normal and breath sounds normal. No respiratory distress. She has no wheezes. She exhibits no tenderness.  Abdominal: Soft. Bowel sounds are normal. She exhibits no distension, no abdominal bruit and no mass. There is  no tenderness.  Genitourinary: No breast swelling, tenderness, discharge or bleeding.  Breast exam: No mass, nodules, thickening, tenderness, bulging, retraction, inflamation, nipple discharge or skin changes noted.  No axillary or clavicular LA.    Pt has dense and fibrocystic breast tissue bilaterally  Musculoskeletal: Normal range of motion. She exhibits no edema or tenderness.  Lymphadenopathy:    She has no cervical adenopathy.  Neurological: She is alert. She has normal reflexes. No cranial nerve deficit. She exhibits normal muscle tone. Coordination normal.  Skin: Skin is warm and dry. No rash noted. No erythema. No pallor.  Solar lentigines diffusely  Psychiatric: She has a normal mood and affect.  Pleasant and talkative           Assessment & Plan:   Problem List Items Addressed This Visit      Cardiovascular and Mediastinum   Ascending aortic aneurysm (HCC)    Watched by vascular/no c/o        Other   Screening for HIV (human immunodeficiency virus)    HIV added to labs today No known contacts       Relevant Orders   HIV antibody (with reflex)   Routine general medical examination at a health care facility  - Primary    Reviewed health habits including diet and exercise and skin cancer prevention Reviewed appropriate screening tests for age  Also reviewed health mt list, fam hx and immunization status , as well as social and family history   See HPI Pt denies active eating disorder currently  Labs today  Ref for diagnostic mammogram  Continues cardiology/vascular f/u      Relevant Orders   CBC with Differential/Platelet   Comprehensive metabolic panel   TSH   Lipid panel   Bilateral breast cysts    Overdue for diag mm Mammogram ordered  No changes on breast exam- has fibrocystic changes  Enc monthly self exams       Relevant Orders   MM Digital Diagnostic Bilat

## 2016-04-21 NOTE — Assessment & Plan Note (Signed)
Reviewed health habits including diet and exercise and skin cancer prevention Reviewed appropriate screening tests for age  Also reviewed health mt list, fam hx and immunization status , as well as social and family history   See HPI Pt denies active eating disorder currently  Labs today  Ref for diagnostic mammogram  Continues cardiology/vascular f/u

## 2016-04-21 NOTE — Assessment & Plan Note (Signed)
Watched by vascular/no c/o

## 2016-04-21 NOTE — Patient Instructions (Signed)
Labs today  Stop at check out for referral for mammogram  Take care of yourself

## 2016-04-21 NOTE — Assessment & Plan Note (Signed)
Overdue for diag mm Mammogram ordered  No changes on breast exam- has fibrocystic changes  Enc monthly self exams

## 2016-04-21 NOTE — Assessment & Plan Note (Signed)
HIV added to labs today No known contacts

## 2016-04-22 ENCOUNTER — Telehealth: Payer: Self-pay | Admitting: Family Medicine

## 2016-04-22 DIAGNOSIS — N6001 Solitary cyst of right breast: Secondary | ICD-10-CM

## 2016-04-22 DIAGNOSIS — N6002 Solitary cyst of left breast: Principal | ICD-10-CM

## 2016-04-22 LAB — LIPID PANEL
CHOL/HDL RATIO: 3.3 ratio (ref 0.0–4.4)
Cholesterol, Total: 231 mg/dL — ABNORMAL HIGH (ref 100–199)
HDL: 69 mg/dL (ref >39)
LDL CALC: 144 mg/dL — AB (ref 0–99)
TRIGLYCERIDES: 92 mg/dL (ref 0–149)
VLDL Cholesterol Cal: 18 mg/dL (ref 5–40)

## 2016-04-22 LAB — CBC WITH DIFFERENTIAL/PLATELET
BASOS: 0 %
Basophils Absolute: 0 10*3/uL (ref 0.0–0.2)
EOS (ABSOLUTE): 0.2 10*3/uL (ref 0.0–0.4)
EOS: 3 %
HEMATOCRIT: 42.3 % (ref 34.0–46.6)
HEMOGLOBIN: 14 g/dL (ref 11.1–15.9)
IMMATURE GRANS (ABS): 0 10*3/uL (ref 0.0–0.1)
IMMATURE GRANULOCYTES: 0 %
LYMPHS: 28 %
Lymphocytes Absolute: 2.4 10*3/uL (ref 0.7–3.1)
MCH: 30.3 pg (ref 26.6–33.0)
MCHC: 33.1 g/dL (ref 31.5–35.7)
MCV: 92 fL (ref 79–97)
MONOCYTES: 7 %
Monocytes Absolute: 0.6 10*3/uL (ref 0.1–0.9)
NEUTROS PCT: 62 %
Neutrophils Absolute: 5.4 10*3/uL (ref 1.4–7.0)
Platelets: 324 10*3/uL (ref 150–379)
RBC: 4.62 x10E6/uL (ref 3.77–5.28)
RDW: 13.8 % (ref 12.3–15.4)
WBC: 8.7 10*3/uL (ref 3.4–10.8)

## 2016-04-22 LAB — COMPREHENSIVE METABOLIC PANEL
A/G RATIO: 1.6 (ref 1.2–2.2)
ALBUMIN: 4.5 g/dL (ref 3.5–5.5)
ALT: 15 IU/L (ref 0–32)
AST: 18 IU/L (ref 0–40)
Alkaline Phosphatase: 44 IU/L (ref 39–117)
BUN / CREAT RATIO: 11 (ref 9–23)
BUN: 11 mg/dL (ref 6–24)
Bilirubin Total: 0.4 mg/dL (ref 0.0–1.2)
CALCIUM: 9.5 mg/dL (ref 8.7–10.2)
CO2: 23 mmol/L (ref 18–29)
Chloride: 98 mmol/L (ref 96–106)
Creatinine, Ser: 0.97 mg/dL (ref 0.57–1.00)
GFR, EST AFRICAN AMERICAN: 80 mL/min/{1.73_m2} (ref >59)
GFR, EST NON AFRICAN AMERICAN: 70 mL/min/{1.73_m2} (ref >59)
GLOBULIN, TOTAL: 2.8 g/dL (ref 1.5–4.5)
Glucose: 89 mg/dL (ref 65–99)
POTASSIUM: 3.9 mmol/L (ref 3.5–5.2)
Sodium: 141 mmol/L (ref 134–144)
TOTAL PROTEIN: 7.3 g/dL (ref 6.0–8.5)

## 2016-04-22 LAB — HIV ANTIBODY (ROUTINE TESTING W REFLEX): HIV Screen 4th Generation wRfx: NONREACTIVE

## 2016-04-22 LAB — TSH: TSH: 2.08 u[IU]/mL (ref 0.450–4.500)

## 2016-04-22 NOTE — Telephone Encounter (Signed)
Dr[facility] Milinda Antisower, [facility]'s MM.  MM: [id number]  U/S: [id number]  Please advise

## 2016-04-22 NOTE — Telephone Encounter (Signed)
Orders done

## 2016-04-24 ENCOUNTER — Encounter: Payer: Self-pay | Admitting: *Deleted

## 2016-04-28 ENCOUNTER — Other Ambulatory Visit: Payer: Self-pay

## 2016-05-07 ENCOUNTER — Ambulatory Visit
Admission: RE | Admit: 2016-05-07 | Discharge: 2016-05-07 | Disposition: A | Discharge status: Home / Self Care | Payer: 59 | Source: Ambulatory Visit | Attending: Family Medicine | Admitting: Family Medicine

## 2016-05-07 DIAGNOSIS — N6001 Solitary cyst of right breast: Secondary | ICD-10-CM

## 2016-05-07 DIAGNOSIS — N6002 Solitary cyst of left breast: Principal | ICD-10-CM

## 2016-05-07 LAB — HM MAMMOGRAPHY

## 2016-05-08 ENCOUNTER — Encounter: Payer: Self-pay | Admitting: *Deleted

## 2016-05-12 NOTE — Progress Notes (Signed)
Patient ID: Elizabeth Hayden, female   DOB: [birth year], 45 y.o.   MRN: [id number] Elizabeth Hayden returns today for followup. She has a history of bicuspid aortic valve with coarctation repair.. She has had a long standing history of  chronic edema, this tends to be generalizes body edema both in her legs, and her hands and in her face.   Doing better finally divorced working in different department at [employer] Daughter at [employer]  F/U MRI / [place] done 07/23/14 and aortic root now 5.0cm  IMPRESSION:  1) Increase in size of ascending aortic aneurysm 5.0 cm compared to  11/03/11 where it measured 4.5 cm.  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 Valve-sparing aortic root replacement with reimplantation of the native aortic valve and left and right coronary arteries ( 28 mm Gelweave valsalva graft). 4. Replacement of ascending aortic aneurysm with a 28 mm Hemashield graft using deep hypothermic circulatory arrest.  Echo  10/31/14  Reviewed and normal EF no significant AR/AS and no coarctation gradient   Study Conclusions  - Left ventricle: The cavity size was normal. Wall thickness was normal. Systolic function was normal. The estimated ejection fraction was in the range of 55% to 60%. - Aortic valve: Not well seen but known to be bicuspid with valve sparing surgery no signficiant AS/AR. Valve area (VTI): 1.73 cm^2. Valve area (Vmax): 1.66 cm^2. Valve area (Vmean): 1.63 cm^2. - Aorta: Normal appearance of root replacement with no residual aneurysm. - Mitral valve: Valve area by pressure half-time: 2.47 cm^2. Valve area by continuity equation  (using LVOT flow): 1.72 cm^2. - Atrial septum: No defect or patent foramen ovale was identified. - Impressions: Previous coarctation repair. No velocities recorded over 22msec on suprasternal notch images of the descending thoracic aorta  Impressions:  - Previous coarctation repair. No velocities recorded over 279mec on suprasternal notch images of the descending thoracic aorta  Daughter Elizabeth Journeyraduated from [employer] doing HLA typing at [employer] Some social stress   ROS: Denies fever, malais, weight loss, blurry vision, decreased visual acuity, cough, sputum, SOB, hemoptysis, pleuritic pain, palpitaitons, heartburn, abdominal pain, melena, lower extremity edema, claudication, or rash.  All other systems reviewed and negative  General: Affect appropriate Healthy:  appears stated age HE48normal Neck supple with no adenopathy JVP normal no bruits no thyromegaly Lungs clear with no wheezing and good diaphragmatic motion Heart:  S1/S2 SEM murmur, no rub, gallop or click PMI normal Abdomen: benighn, BS positve, no tenderness, no AAA no bruit.  No HSM or HJR Distal pulses intact with no bruits No edema Neuro non-focal Skin warm and dry No muscular weakness   Current Outpatient Prescriptions  Medication Sig Dispense Refill  . Biotin w/ Vitamins C & E (HAIR SKIN & NAILS GUMMIES PO) Take 1 tablet by mouth daily.    . Marland KitchenALCIUM-VITAMIN D PO Take 2 tablets by mouth daily.      . FORFIVO XL 450 MG TB24 Take 1 tablet by mouth  daily 90 tablet 0  . ibuprofen (ADVIL,MOTRIN) 200 MG tablet Take 400 mg by mouth every 6 (six) hours as needed for  headache.    . Multiple Vitamin (MULTIVITAMIN) capsule Take 1 capsule by mouth daily.      . norethindrone-ethinyl estradiol (CYCLAFEM,ALYACEN) 0.5/0.75/1-35 MG-MCG tablet Take 1 tablet by mouth daily. 3 Package 3  . Probiotic Product (ALIGN PO) Take 1 capsule by mouth daily.    . ranitidine (ZANTAC) 75 MG tablet Take 150 mg by  mouth daily.     No current facility-administered medications for this visit.     Allergies  Ace inhibitors and Altace [ramipril]  Electrocardiogram:   09/21/14 SR LAFB  LAE  Rate 85  05/13/16  SR rate 79 Biatrial enlargement LAFB   Assessment and Plan Aortic Aneurysm:  Post root replacement BP  Elevated will get BP cuff and check at home f/u with me in a few weeks Start Toprol if home readings high. Bicuspid AV:  Valve sparing surgery no significant AR/AS  SBE given gortex root Abberant Left Sublavian:  No evidence of impingement normal UE pulses  No tests.  F/U next available for BP check   Jenkins Rouge

## 2016-05-13 ENCOUNTER — Ambulatory Visit (INDEPENDENT_AMBULATORY_CARE_PROVIDER_SITE_OTHER): Payer: 59 | Admitting: Cardiovascular Disease

## 2016-05-13 ENCOUNTER — Encounter: Payer: Self-pay | Admitting: Cardiovascular Disease

## 2016-05-13 VITALS — BP 150/95 | HR 82 | Ht 68.0 in | Wt 148.8 lb

## 2016-05-13 DIAGNOSIS — I359 Nonrheumatic aortic valve disorder, unspecified: Secondary | ICD-10-CM | POA: Diagnosis not present

## 2016-05-13 NOTE — Patient Instructions (Addendum)
Medication Instructions:  Your physician recommends that you continue on your current medications as directed. Please refer to the Current Medication list given to you today.  Labwork: NONE  Testing/Procedures: NONE  Follow-Up: Your physician wants you to follow-up in: next available with Dr. Eden Emms or with PA or NP.   If you need a refill on your cardiac medications before your next appointment, please call your pharmacy.

## 2016-05-20 ENCOUNTER — Telehealth: Payer: Self-pay | Admitting: Cardiovascular Disease

## 2016-05-20 MED ORDER — METOPROLOL SUCCINATE ER 50 MG PO TB24: 50.0000 mg | ORAL_TABLET | Freq: Every day | ORAL | 3 refills | Status: DC

## 2016-05-20 NOTE — Telephone Encounter (Signed)
Per last office visit with Dr. Eden EmmsNishan, "Aortic Aneurysm:  Post root replacement BP  Elevated will get BP cuff and check at home f/u with me in a few weeks Start Toprol if home readings high."   Will forward to Dr. Eden EmmsNishan for dosage for the Toprol.

## 2016-05-20 NOTE — Telephone Encounter (Signed)
Call in Toprol 50 mg daily take in am

## 2016-05-20 NOTE — Telephone Encounter (Signed)
Left message for patient to call back. Sent in patient's pharmacy Toprol 50 mg by mouth daily.

## 2016-05-20 NOTE — Telephone Encounter (Signed)
New message   Pt verbalized that she is needing Dr Eden EmmsNishan to call in a prescription for a Bata blocker   BP has not decreased below 140/88

## 2016-05-25 ENCOUNTER — Telehealth: Payer: Self-pay | Admitting: Cardiovascular Disease

## 2016-05-25 NOTE — Telephone Encounter (Signed)
Patient called back. Informed patient of new prescription, and it was sent to her pharmacy. Patient verbalized understanding

## 2016-05-25 NOTE — Telephone Encounter (Signed)
New message ° ° ° ° °Pt returning nurse call. Please call.  °

## 2016-05-25 NOTE — Telephone Encounter (Signed)
See phone note from 05/20/16

## 2016-07-01 ENCOUNTER — Encounter: Payer: Self-pay | Admitting: Cardiovascular Disease

## 2016-07-04 ENCOUNTER — Other Ambulatory Visit: Payer: Self-pay | Admitting: Family Medicine

## 2016-07-09 NOTE — Progress Notes (Signed)
Patient ID: Elizabeth Hayden, female   DOB: 13-Feb-1971, 45 y.o.   MRN: [id number] Elizabeth Hayden returns today for followup. She has a history of bicuspid aortic valve with coarctation repair.. She has had a long standing history of  chronic edema, this tends to be generalizes body edema both in her legs, and her hands and in her face.   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 Valve-sparing aortic root replacement with reimplantation of the native aortic valve and left and right coronary arteries ( 28 mm Gelweave valsalva graft). 4. Replacement of ascending aortic aneurysm with a 28 mm Hemashield graft using deep hypothermic circulatory arrest.  Echo  10/31/14  Reviewed and normal EF no significant AR/AS and no coarctation gradient   Study Conclusions  - Left ventricle: The cavity size was normal. Wall thickness was normal. Systolic function was normal. The estimated ejection fraction was in the range of 55% to 60%. - Aortic valve: Not well seen but known to be bicuspid with valve sparing surgery no signficiant AS/AR. Valve area (VTI): 1.73 cm^2. Valve area (Vmax): 1.66 cm^2. Valve area (Vmean): 1.63 cm^2. - Aorta: Normal appearance of root replacement with no residual aneurysm. - Mitral valve: Valve area by  pressure half-time: 2.47 cm^2. Valve area by continuity equation (using LVOT flow): 1.72 cm^2. - Atrial septum: No defect or patent foramen ovale was identified. - Impressions: Previous coarctation repair. No velocities recorded over 32msec on suprasternal notch images of the descending thoracic aorta  Impressions:  - Previous coarctation repair. No velocities recorded over 280mec on suprasternal notch images of the descending thoracic aorta  Daughter Elizabeth Hayden from [employer] doing HLA typing at [employer] sensation of swelling in her hands and feet after walking   ROS: Denies fever, malais, weight loss, blurry vision, decreased visual acuity, cough, sputum, SOB, hemoptysis, pleuritic pain, palpitaitons, heartburn, abdominal pain, melena, lower extremity edema, claudication, or rash.  All other systems reviewed and negative  General: Affect appropriate Healthy:  appears stated age HE5normal Neck supple with no adenopathy JVP normal no bruits no thyromegaly Lungs clear with no wheezing and good diaphragmatic motion Heart:  S1/S2 SEM murmur, no rub, gallop or click PMI normal Abdomen: benighn, BS positve, no tenderness, no AAA no bruit.  No HSM or HJR Distal pulses intact with no bruits No edema Neuro non-focal Skin warm and dry No muscular weakness   Current Outpatient Prescriptions  Medication Sig Dispense Refill  . Biotin w/ Vitamins C & E (HAIR SKIN & NAILS GUMMIES PO) Take 1 tablet by mouth daily.    . Marland KitchenALCIUM-VITAMIN D PO Take 2 tablets by mouth daily.      . FORFIVO XL 450 MG TB24 Take 1 tablet by mouth  daily 90 tablet 1  . ibuprofen (ADVIL,MOTRIN) 200 MG tablet Take 400 mg by mouth every 6 (six) hours as needed for headache.    . metoprolol succinate (TOPROL-XL) 50 MG 24 hr tablet Take 1 tablet (50 mg total) by mouth daily. Take with or immediately following a meal. 90 tablet 3  . Multiple Vitamin (MULTIVITAMIN) capsule Take 1 capsule by  mouth daily.      . norethindrone-ethinyl estradiol (CYCLAFEM,ALYACEN) 0.5/0.75/1-35 MG-MCG tablet Take 1 tablet by mouth daily. 3 Package 3  . Probiotic Product (ALIGN PO) Take 1 capsule by mouth daily.    . ranitidine (ZANTAC) 75 MG tablet Take 150 mg by mouth daily.     No current facility-administered medications for this visit.     Allergies  Ace inhibitors and Altace [ramipril]  Electrocardiogram:   09/21/14 SR LAFB  LAE  Rate 85  05/13/16  SR rate 79 Biatrial enlargement LAFB   Assessment and Plan Aortic Aneurysm:  Post root replacement On beta blocker  Bicuspid AV:  Valve sparing surgery no significant AR/AS  SBE given gortex root Abberant Left Sublavian:  No evidence of impingement normal UE pulses Edema: low sodium diet generalized consider PRN diuretic GERD:  On zantac low carb diet   No tests.  F/U in a year   Elizabeth Hayden

## 2016-07-15 ENCOUNTER — Ambulatory Visit (INDEPENDENT_AMBULATORY_CARE_PROVIDER_SITE_OTHER): Payer: 59 | Admitting: Cardiovascular Disease

## 2016-07-15 ENCOUNTER — Encounter (INDEPENDENT_AMBULATORY_CARE_PROVIDER_SITE_OTHER): Payer: Self-pay

## 2016-07-15 ENCOUNTER — Encounter: Payer: Self-pay | Admitting: Cardiovascular Disease

## 2016-07-15 VITALS — BP 120/66 | HR 64 | Ht 68.0 in | Wt 152.0 lb

## 2016-07-15 DIAGNOSIS — I359 Nonrheumatic aortic valve disorder, unspecified: Secondary | ICD-10-CM

## 2016-07-15 NOTE — Patient Instructions (Addendum)

## 2017-01-05 ENCOUNTER — Telehealth: Payer: Self-pay | Admitting: Family Medicine

## 2017-01-05 ENCOUNTER — Encounter: Payer: Self-pay | Admitting: Podiatry

## 2017-01-05 ENCOUNTER — Ambulatory Visit (INDEPENDENT_AMBULATORY_CARE_PROVIDER_SITE_OTHER): Payer: 59 | Admitting: Podiatry

## 2017-01-05 VITALS — BP 126/79 | HR 70 | Ht 68.0 in | Wt 152.0 lb

## 2017-01-05 DIAGNOSIS — Z0000 Encounter for general adult medical examination without abnormal findings: Secondary | ICD-10-CM

## 2017-01-05 DIAGNOSIS — M7741 Metatarsalgia, right foot: Secondary | ICD-10-CM | POA: Diagnosis not present

## 2017-01-05 DIAGNOSIS — R52 Pain, unspecified: Secondary | ICD-10-CM | POA: Diagnosis not present

## 2017-01-05 MED ORDER — MELOXICAM 15 MG PO TABS: 15.0000 mg | ORAL_TABLET | Freq: Every day | ORAL | 1 refills | Status: DC

## 2017-01-05 NOTE — Progress Notes (Signed)
Patient ID: Elizabeth Hayden, female   DOB: 09/18/1971, 46 y.o.   MRN: [id number]   Subjective:  Patient presents to the office today as a new patient for evaluation of right foot pain 2 weeks. Patient states that when she stepped out of bed in the morning approximately 2 weeks ago she developed a sharp pain to the ball of her right foot. Patient has been using her father's anti-inflammatory pain cream which she states helps. Patient has noticed improvement over the last 2 weeks.    Objective/Physical Exam General: The patient is alert and oriented x3 in no acute distress.  Dermatology: Skin is warm, dry and supple bilateral lower extremities. Negative for open lesions or macerations.  Vascular: Palpable pedal pulses bilaterally. No edema or erythema noted. Capillary refill within normal limits.  Neurological: Epicritic and protective threshold grossly intact bilaterally.   Musculoskeletal Exam: Minimal, diffuse pain on palpation throughout the metatarsal heads of the right foot. There is no exact point of pain. Range of motion within normal limits to all pedal and ankle joints bilateral. Muscle strength 5/5 in all groups bilateral.   Radiographic Exam:  Normal osseous mineralization. Joint spaces preserved. No fracture/dislocation/boney destruction.    Assessment: #1 metatarsalgia right foot with neuritis   Plan of Care:  #1 Patient was evaluated. #2 continue anti-inflammatory pain cream #3 prescription for meloxicam 15 mg #4 return to clinic when necessary   Elizabeth Hayden, DPM [facility]  Dr. Felecia ShellingBrent M. Kinzi Hayden, DPM    [address]                                        [place], [place] [address]                Office [phone]  Fax [phone]

## 2017-01-05 NOTE — Progress Notes (Signed)
   Subjective:    Patient ID: Elizabeth Hayden, female    DOB: 12/01/1970, 46 y.o.   MRN: [id number]  HPI    Review of Systems  Constitutional: Positive for activity change and unexpected weight change.  Musculoskeletal: Positive for gait problem.       Pain in ball of foot  All other systems reviewed and are negative.      Objective:   Physical Exam        Assessment & Plan:

## 2017-01-05 NOTE — Telephone Encounter (Signed)
Patient scheduled cpx w/ Dr.Tower on 04/23/17.  Patient works for [employer]  Patient said the order can be put in [employer], so she can have the lab work done before her appointment.

## 2017-01-05 NOTE — Telephone Encounter (Signed)
Order done and printed

## 2017-01-05 NOTE — Telephone Encounter (Signed)
Lab orders mailed to pt.

## 2017-01-05 NOTE — Addendum Note (Signed)
Addended by: Geraldine ContrasVENABLE, Syed Zukas D on: 01/05/2017 11:02 AM   Modules accepted: Orders

## 2017-01-12 ENCOUNTER — Other Ambulatory Visit: Payer: Self-pay | Admitting: Family Medicine

## 2017-01-13 NOTE — Telephone Encounter (Signed)
Pt has CPE scheduled on 04/23/17, last filled on 07/06/16 #90 tabs with 1 additional refill, please advise

## 2017-01-13 NOTE — Telephone Encounter (Signed)
Please refill for 6 mo 

## 2017-01-14 NOTE — Telephone Encounter (Signed)
done

## 2017-01-19 ENCOUNTER — Other Ambulatory Visit: Payer: Self-pay

## 2017-01-19 DIAGNOSIS — Z308 Encounter for other contraceptive management: Secondary | ICD-10-CM

## 2017-01-19 MED ORDER — NORETHIN-ETH ESTRAD TRIPHASIC 0.5/0.75/1-35 MG-MCG PO TABS: 1.0000 | ORAL_TABLET | Freq: Every day | ORAL | 0 refills | Status: DC

## 2017-02-23 ENCOUNTER — Other Ambulatory Visit: Payer: Self-pay | Admitting: Podiatry

## 2017-03-12 ENCOUNTER — Other Ambulatory Visit: Payer: Self-pay | Admitting: Obstetrics and Gynecology

## 2017-03-12 ENCOUNTER — Other Ambulatory Visit: Payer: Self-pay | Admitting: Cardiovascular Disease

## 2017-03-12 DIAGNOSIS — Z308 Encounter for other contraceptive management: Secondary | ICD-10-CM

## 2017-03-17 ENCOUNTER — Ambulatory Visit: Payer: 59 | Admitting: Obstetrics and Gynecology

## 2017-03-24 LAB — CBC WITH DIFFERENTIAL/PLATELET
BASOS ABS: 0 10*3/uL (ref 0.0–0.2)
Basos: 0 %
EOS (ABSOLUTE): 0.1 10*3/uL (ref 0.0–0.4)
Eos: 2 %
HEMOGLOBIN: 12.8 g/dL (ref 11.1–15.9)
Hematocrit: 38.2 % (ref 34.0–46.6)
IMMATURE GRANULOCYTES: 0 %
Immature Grans (Abs): 0 10*3/uL (ref 0.0–0.1)
Lymphocytes Absolute: 1.9 10*3/uL (ref 0.7–3.1)
Lymphs: 33 %
MCH: 31.4 pg (ref 26.6–33.0)
MCHC: 33.5 g/dL (ref 31.5–35.7)
MCV: 94 fL (ref 79–97)
MONOCYTES: 9 %
Monocytes Absolute: 0.5 10*3/uL (ref 0.1–0.9)
NEUTROS PCT: 56 %
Neutrophils Absolute: 3.3 10*3/uL (ref 1.4–7.0)
Platelets: 284 10*3/uL (ref 150–379)
RBC: 4.08 x10E6/uL (ref 3.77–5.28)
RDW: 13.2 % (ref 12.3–15.4)
WBC: 5.9 10*3/uL (ref 3.4–10.8)

## 2017-03-24 LAB — LIPID PANEL
CHOLESTEROL TOTAL: 178 mg/dL (ref 100–199)
Chol/HDL Ratio: 3.3 ratio (ref 0.0–4.4)
HDL: 54 mg/dL (ref >39)
LDL Calculated: 107 mg/dL — ABNORMAL HIGH (ref 0–99)
TRIGLYCERIDES: 84 mg/dL (ref 0–149)
VLDL CHOLESTEROL CAL: 17 mg/dL (ref 5–40)

## 2017-03-24 LAB — COMPREHENSIVE METABOLIC PANEL
ALK PHOS: 30 IU/L — AB (ref 39–117)
ALT: 22 IU/L (ref 0–32)
AST: 24 IU/L (ref 0–40)
Albumin/Globulin Ratio: 1.6 (ref 1.2–2.2)
Albumin: 3.9 g/dL (ref 3.5–5.5)
BUN/Creatinine Ratio: 11 (ref 9–23)
BUN: 10 mg/dL (ref 6–24)
Bilirubin Total: 0.3 mg/dL (ref 0.0–1.2)
CO2: 24 mmol/L (ref 20–29)
CREATININE: 0.88 mg/dL (ref 0.57–1.00)
Calcium: 8.9 mg/dL (ref 8.7–10.2)
Chloride: 104 mmol/L (ref 96–106)
GFR calc Af Amer: 90 mL/min/{1.73_m2} (ref >59)
GFR calc non Af Amer: 78 mL/min/{1.73_m2} (ref >59)
GLUCOSE: 105 mg/dL — AB (ref 65–99)
Globulin, Total: 2.4 g/dL (ref 1.5–4.5)
Potassium: 3.8 mmol/L (ref 3.5–5.2)
Sodium: 142 mmol/L (ref 134–144)
Total Protein: 6.3 g/dL (ref 6.0–8.5)

## 2017-03-24 LAB — TSH: TSH: 2.14 u[IU]/mL (ref 0.450–4.500)

## 2017-04-03 ENCOUNTER — Other Ambulatory Visit: Payer: Self-pay | Admitting: Obstetrics and Gynecology

## 2017-04-03 DIAGNOSIS — Z308 Encounter for other contraceptive management: Secondary | ICD-10-CM

## 2017-04-09 ENCOUNTER — Other Ambulatory Visit: Payer: Self-pay | Admitting: Obstetrics and Gynecology

## 2017-04-09 DIAGNOSIS — Z308 Encounter for other contraceptive management: Secondary | ICD-10-CM

## 2017-04-23 ENCOUNTER — Encounter: Payer: Self-pay | Admitting: Family Medicine

## 2017-04-23 ENCOUNTER — Ambulatory Visit (INDEPENDENT_AMBULATORY_CARE_PROVIDER_SITE_OTHER): Payer: 59 | Admitting: Family Medicine

## 2017-04-23 VITALS — BP 132/80 | HR 65 | Temp 98.6°F | Ht 67.75 in | Wt 149.2 lb

## 2017-04-23 DIAGNOSIS — Z23 Encounter for immunization: Secondary | ICD-10-CM | POA: Diagnosis not present

## 2017-04-23 DIAGNOSIS — Z0000 Encounter for general adult medical examination without abnormal findings: Secondary | ICD-10-CM

## 2017-04-23 DIAGNOSIS — I712 Thoracic aortic aneurysm, without rupture, unspecified: Secondary | ICD-10-CM

## 2017-04-23 NOTE — Progress Notes (Signed)
Subjective:    Patient ID: Elizabeth Hayden, female    DOB: 1971-01-27, 46 y.o.   MRN: [id number]  HPI Here for health maintenance exam and to review chronic medical problems    Having a good summer  Leaving for [place] soon/ going with family   Son (downs syndrome)- was diagnosed with celiac   Feeling fine for the most part   Wt Readings from Last 3 Encounters:  04/23/17 149 lb 4 oz (67.7 kg)  01/05/17 152 lb (68.9 kg)  07/15/16 152 lb (68.9 kg)   22.86 kg/m  Pap 5/17 normal, is scheduled for august when she gets back with gyn On oral contraceptive  Plans to skip the placebo pills this month for her trip  Periods are still regular on OC - has not stopped them -some night sweats   Mammogram 7/17 normal- does not have it scheduled/ has the card to schedule it after she returns  Self breast exam-she has a few lumps that come and go / fibrocystic baseline   Tdap 1/16  hiv screen neg 7/17  Hx of congenital heart problems with aortic root replacement  Has f/u with cardiology upcoming  Is able to work out at a gym-that is going well / takes classes  Takes metoprolol   Is interested in hep A vaccine   Labs :  Results for orders placed or performed in visit on 01/05/17  CBC with Differential/Platelet  Result Value Ref Range   WBC 5.9 3.4 - 10.8 x10E3/uL   RBC 4.08 3.77 - 5.28 x10E6/uL   Hemoglobin 12.8 11.1 - 15.9 g/dL   Hematocrit 78.2 95.6 - 46.6 %   MCV 94 79 - 97 fL   MCH 31.4 26.6 - 33.0 pg   MCHC 33.5 31.5 - 35.7 g/dL   RDW 21.3 08.6 - 57.8 %   Platelets 284 150 - 379 x10E3/uL   Neutrophils 56 Not Estab. %   Lymphs 33 Not Estab. %   Monocytes 9 Not Estab. %   Eos 2 Not Estab. %   Basos 0 Not Estab. %   Neutrophils Absolute 3.3 1.4 - 7.0 x10E3/uL   Lymphocytes Absolute 1.9 0.7 - 3.1 x10E3/uL   Monocytes Absolute 0.5 0.1 - 0.9 x10E3/uL   EOS (ABSOLUTE) 0.1 0.0 - 0.4 x10E3/uL   Basophils Absolute 0.0 0.0 - 0.2 x10E3/uL   Immature Granulocytes 0 Not Estab. %     Immature Grans (Abs) 0.0 0.0 - 0.1 x10E3/uL  Comprehensive metabolic panel  Result Value Ref Range   Glucose 105 (H) 65 - 99 mg/dL   BUN 10 6 - 24 mg/dL   Creatinine, Ser 4.69 0.57 - 1.00 mg/dL   GFR calc non Af Amer 78 >59 mL/min/1.73   GFR calc Af Amer 90 >59 mL/min/1.73   BUN/Creatinine Ratio 11 9 - 23   Sodium 142 134 - 144 mmol/L   Potassium 3.8 3.5 - 5.2 mmol/L   Chloride 104 96 - 106 mmol/L   CO2 24 20 - 29 mmol/L   Calcium 8.9 8.7 - 10.2 mg/dL   Total Protein 6.3 6.0 - 8.5 g/dL   Albumin 3.9 3.5 - 5.5 g/dL   Globulin, Total 2.4 1.5 - 4.5 g/dL   Albumin/Globulin Ratio 1.6 1.2 - 2.2   Bilirubin Total 0.3 0.0 - 1.2 mg/dL   Alkaline Phosphatase 30 (L) 39 - 117 IU/L   AST 24 0 - 40 IU/L   ALT 22 0 - 32 IU/L  Lipid panel  Result  Value Ref Range   Cholesterol, Total 178 100 - 199 mg/dL   Triglycerides 84 0 - 149 mg/dL   HDL 54 >16>39 mg/dL   VLDL Cholesterol Cal 17 5 - 40 mg/dL   LDL Calculated 109107 (H) 0 - 99 mg/dL   Chol/HDL Ratio 3.3 0.0 - 4.4 ratio  TSH  Result Value Ref Range   TSH 2.140 0.450 - 4.500 uIU/mL     She takes omega 3 supplement  LDL is improved from 144 to 107  Exercising regularly also   She has had several utis over the years - has used [facility]  Drinks a lot of water   Patient Active Problem List   Diagnosis Date Noted  . Bilateral breast cysts 04/21/2016  . Screening for HIV (human immunodeficiency virus) 04/21/2016  . Neck pain on left side 06/01/2014  . Other screening mammogram 07/25/2012  . Coarctation of aorta 11/24/2011  . Aortic aneurysm (HCC) 11/24/2011  . Abnormal Pap smear and cervical HPV (human papillomavirus) 05/31/2011  . Gynecological examination 05/19/2011  . Routine general medical examination at a health care facility 05/19/2011  . CERV HIGH RISK HUMAN PAPILLOMAVIRUS DNA TEST POS 11/14/2009  . History of eating disorder 11/02/2007  . DEPRESSION 11/02/2007  . CARDIOMYOPATHY, HYPERTROPHIC, OBSTRUCTIVE 11/02/2007  .  Allergic rhinitis 11/02/2007  . BICUSPID AORTIC VALVE 11/02/2007   Past Medical History:  Diagnosis Date  . Aortic root dilation (HCC)   . Congenital heart disease    bicuspid aortic valve with coarctation repair 04/1974  . Depression   . Edema   . Fibrocystic breast   . GERD (gastroesophageal reflux disease)    takes Zantac daily as needed  . HPV (human papilloma virus) infection   . Seizures (HCC)    as a baby and only 1 time d/t high fever  . Shortness of breath dyspnea    with exertion  . Sinus infection    Nov 16 and treated with antibiotic  . TMJ (dislocation of temporomandibular joint)    Wears a night gear   . UTI (lower urinary tract infection)    hx of   . Yeast infection    08/17/14   Past Surgical History:  Procedure Laterality Date  . ASCENDING AORTIC ROOT REPLACEMENT N/A 09/03/2014   Procedure: ASCENDING AORTIC ROOT REPLACEMENT & VALVE SPARING ROOT;  Surgeon: Alleen BorneBryan K Bartle, MD;  Location: [facility] OR;  Service: Open Heart Surgery;  Laterality: N/A;  CIRC ARREST  . COARCTATION OF AORTA EXCISION  1975  . EYE SURGERY    . EYE SURGERY Bilateral 1985  . INTRAOPERATIVE TRANSESOPHAGEAL ECHOCARDIOGRAM N/A 09/03/2014   Procedure: INTRAOPERATIVE TRANSESOPHAGEAL ECHOCARDIOGRAM;  Surgeon: Alleen BorneBryan K Bartle, MD;  [facility];  Service: Open Heart Surgery;  Laterality: N/A;  . LEFT HEART CATHETERIZATION WITH CORONARY ANGIOGRAM N/A 08/06/2014   Procedure: LEFT HEART CATHETERIZATION WITH CORONARY ANGIOGRAM;  Surgeon: Wendall StadePeter C Nishan, MD;  [facility];  Service: Cardiovascular;  Laterality: N/A;  . TEE WITHOUT CARDIOVERSION N/A 08/06/2014   Procedure: TRANSESOPHAGEAL ECHOCARDIOGRAM (TEE);  Surgeon: Wendall StadePeter C Nishan, MD;  [facility];  Service: Cardiovascular;  Laterality: N/A;   Social History  Substance Use Topics  . Smoking status: Never Smoker  . Smokeless tobacco: Never Used  . Alcohol use 0.0 oz/week     Comment: occasional   Family History  Problem  Relation Age of Onset  . Hypertension Father   . Diabetes Father   . Cancer Father  lung   . Breast cancer Maternal Grandmother   . Leukemia Maternal Grandmother   . Ovarian cancer Paternal Grandmother   . Celiac disease Son    Allergies  Allergen Reactions  . Ace Inhibitors     REACTION: rash and cough  . Altace [Ramipril] Hives and Itching   Current Outpatient Prescriptions on File Prior to Visit  Medication Sig Dispense Refill  . Biotin w/ Vitamins C & E (HAIR SKIN & NAILS GUMMIES PO) Take 1 tablet by mouth daily.    Marland Kitchen CALCIUM-VITAMIN D PO Take 2 tablets by mouth daily.      . FORFIVO XL 450 MG TB24 TAKE 1 TABLET BY MOUTH  DAILY 90 tablet 1  . ibuprofen (ADVIL,MOTRIN) 200 MG tablet Take 400 mg by mouth every 6 (six) hours as needed for headache.    . meloxicam (MOBIC) 15 MG tablet TAKE 1 TABLET BY MOUTH  DAILY 90 tablet 0  . metoprolol succinate (TOPROL-XL) 50 MG 24 hr tablet TAKE 1 TABLET BY MOUTH  DAILY WITH OR IMMEDIATLEY  FOLLOWING A MEAL 90 tablet 0  . Multiple Vitamin (MULTIVITAMIN) capsule Take 1 capsule by mouth daily.      Marland Kitchen PIRMELLA 7/7/7 0.5/0.75/1-35 MG-MCG tablet TAKE 1 TABLET BY MOUTH  DAILY 84 tablet 0  . Probiotic Product (ALIGN PO) Take 1 capsule by mouth daily.    . ranitidine (ZANTAC) 75 MG tablet Take 150 mg by mouth daily.     No current facility-administered medications on file prior to visit.     Review of Systems    Review of Systems  Constitutional: Negative for fever, appetite change, fatigue and unexpected weight change.  Eyes: Negative for pain and visual disturbance.  Respiratory: Negative for cough and shortness of breath.   Cardiovascular: Negative for cp or palpitations    Gastrointestinal: Negative for nausea, diarrhea and constipation.  Genitourinary: Negative for urgency and frequency.  Skin: Negative for pallor or rash   Neurological: Negative for weakness, light-headedness, numbness and headaches.  Hematological: Negative for  adenopathy. Does not bruise/bleed easily.  Psychiatric/Behavioral: Negative for dysphoric mood. The patient is not nervous/anxious.      Objective:   Physical Exam  Constitutional: She appears well-developed and well-nourished. No distress.  Well appearing  HENT:  Head: Normocephalic and atraumatic.  Right Ear: External ear normal.  Left Ear: External ear normal.  Nose: Nose normal.  Mouth/Throat: Oropharynx is clear and moist.  Eyes: Pupils are equal, round, and reactive to light. Conjunctivae and EOM are normal. Right eye exhibits no discharge. Left eye exhibits no discharge. No scleral icterus.  Neck: Normal range of motion. Neck supple. No JVD present. Carotid bruit is not present. No thyromegaly present.  Cardiovascular: Normal rate, regular rhythm and intact distal pulses.  Exam reveals no gallop.   Murmur heard. Pulmonary/Chest: Effort normal and breath sounds normal. No respiratory distress. She has no wheezes. She has no rales.  Abdominal: Soft. Bowel sounds are normal. She exhibits no distension and no mass. There is no tenderness.  Musculoskeletal: She exhibits no edema or tenderness.  Lymphadenopathy:    She has no cervical adenopathy.  Neurological: She is alert. She has normal reflexes. No cranial nerve deficit. She exhibits normal muscle tone. Coordination normal.  Skin: Skin is warm and dry. No rash noted. No erythema. No pallor.  Solar lentigines diffusely   Psychiatric: She has a normal mood and affect.          Assessment & Plan:  Problem List Items Addressed This Visit      Cardiovascular and Mediastinum   Aortic aneurysm (HCC)    Pt has had aortic root replacement  Watched closely by vascular  Takes metoprolol and bp is good  BP: 132/80  Sees cardiology soon for a routine visit        Other   Routine general medical examination at a health care facility - Primary    Reviewed health habits including diet and exercise and skin cancer  prevention Reviewed appropriate screening tests for age  Also reviewed health mt list, fam hx and immunization status , as well as social and family history   See HPI Labs reviewed  Disc fasting glucose of 105- will watch/ disc low glycemic diet  Cholesterol is improved         Other Visit Diagnoses    Need for hepatitis A vaccination       Relevant Orders   Hepatitis A vaccine adult IM (Completed)

## 2017-04-23 NOTE — Patient Instructions (Addendum)
Schedule your mammogram when you can   Take care of yourself Keep exercising  Eat a healthy diet / watch sweets (your sugar was 105 fasting and we will watch this)  Fluids - aim for 64 oz per day   I'm glad you are doing well   Hepatitis A vaccine today

## 2017-04-25 NOTE — Assessment & Plan Note (Signed)
Pt has had aortic root replacement  Watched closely by vascular  Takes metoprolol and bp is good  BP: 132/80  Sees cardiology soon for a routine visit

## 2017-04-25 NOTE — Assessment & Plan Note (Signed)
Pt has had aortic root replacement  Watched closely by vascular  Takes metoprolol and bp is good  BP: 132/80  Sees cardiology soon for a routine visit 

## 2017-04-25 NOTE — Assessment & Plan Note (Signed)
Reviewed health habits including diet and exercise and skin cancer prevention Reviewed appropriate screening tests for age  Also reviewed health mt list, fam hx and immunization status , as well as social and family history   See HPI Labs reviewed  Disc fasting glucose of 105- will watch/ disc low glycemic diet  Cholesterol is improved

## 2017-05-20 ENCOUNTER — Other Ambulatory Visit: Payer: Self-pay | Admitting: Family Medicine

## 2017-05-20 DIAGNOSIS — Z1231 Encounter for screening mammogram for malignant neoplasm of breast: Secondary | ICD-10-CM

## 2017-05-24 ENCOUNTER — Ambulatory Visit: Payer: 59 | Admitting: Obstetrics and Gynecology

## 2017-05-28 ENCOUNTER — Ambulatory Visit
Admission: RE | Admit: 2017-05-28 | Discharge: 2017-05-28 | Disposition: A | Discharge status: Home / Self Care | Payer: 59 | Source: Ambulatory Visit | Attending: Family Medicine | Admitting: Family Medicine

## 2017-05-28 DIAGNOSIS — Z1231 Encounter for screening mammogram for malignant neoplasm of breast: Secondary | ICD-10-CM

## 2017-06-01 ENCOUNTER — Other Ambulatory Visit: Payer: Self-pay | Admitting: Family Medicine

## 2017-06-01 DIAGNOSIS — R928 Other abnormal and inconclusive findings on diagnostic imaging of breast: Secondary | ICD-10-CM

## 2017-06-02 ENCOUNTER — Ambulatory Visit (INDEPENDENT_AMBULATORY_CARE_PROVIDER_SITE_OTHER): Payer: 59 | Admitting: Family Medicine

## 2017-06-02 ENCOUNTER — Ambulatory Visit (INDEPENDENT_AMBULATORY_CARE_PROVIDER_SITE_OTHER)
Admission: RE | Admit: 2017-06-02 | Discharge: 2017-06-02 | Disposition: A | Discharge status: Home / Self Care | Payer: 59 | Source: Ambulatory Visit | Attending: Family Medicine | Admitting: Family Medicine

## 2017-06-02 ENCOUNTER — Telehealth: Payer: Self-pay | Admitting: Family Medicine

## 2017-06-02 ENCOUNTER — Encounter: Payer: Self-pay | Admitting: Family Medicine

## 2017-06-02 VITALS — BP 126/74 | HR 65 | Temp 98.4°F | Ht 67.75 in | Wt 147.5 lb

## 2017-06-02 DIAGNOSIS — N3000 Acute cystitis without hematuria: Secondary | ICD-10-CM

## 2017-06-02 DIAGNOSIS — M25531 Pain in right wrist: Secondary | ICD-10-CM | POA: Diagnosis not present

## 2017-06-02 DIAGNOSIS — N390 Urinary tract infection, site not specified: Secondary | ICD-10-CM | POA: Insufficient documentation

## 2017-06-02 DIAGNOSIS — S62101A Fracture of unspecified carpal bone, right wrist, initial encounter for closed fracture: Secondary | ICD-10-CM

## 2017-06-02 DIAGNOSIS — Z8781 Personal history of (healed) traumatic fracture: Secondary | ICD-10-CM | POA: Insufficient documentation

## 2017-06-02 NOTE — Progress Notes (Signed)
Subjective:    Patient ID: Elizabeth Hayden, female    DOB: [birth year], 46 y.o.   MRN: [id number]  HPI Here with R wrist pain after a fall  7/19 and recent uti  She was tx for uti by md live - ? Septra for 5 d  Symptoms are better but urine still smells bad -wants it re checked No dysuria or other urinary symptoms    She fell on a tour in [place] - slippery stairs  Caught herself on heel of R hand (on marble) and slid down the stairs   Shook it off but it really hurt  It did bruise later that day  She iced it  Then she avoided using it as much as possible and it improved   Came home - went back to regular activities Hurt to lift weights   R handed   No n/t or loss of strength  Pain is in  4th and 5th finger where she landed  She can grip  Hurts to rotate a door knob    Patient Active Problem List   Diagnosis Date Noted  . Right wrist pain 06/02/2017  . UTI (urinary tract infection) 06/02/2017  . Wrist fracture, closed 06/02/2017  . Bilateral breast cysts 04/21/2016  . Screening for HIV (human immunodeficiency virus) 04/21/2016  . Neck pain on left side 06/01/2014  . Other screening mammogram 07/25/2012  . Coarctation of aorta 11/24/2011  . Aortic aneurysm (HCC) 11/24/2011  . Abnormal Pap smear and cervical HPV (human papillomavirus) 05/31/2011  . Gynecological examination 05/19/2011  . Routine general medical examination at a health care facility 05/19/2011  . CERV HIGH RISK HUMAN PAPILLOMAVIRUS DNA TEST POS 11/14/2009  . History of eating disorder 11/02/2007  . DEPRESSION 11/02/2007  . CARDIOMYOPATHY, HYPERTROPHIC, OBSTRUCTIVE 11/02/2007  . Allergic rhinitis 11/02/2007  . BICUSPID AORTIC VALVE 11/02/2007   Past Medical History:  Diagnosis Date  . Aortic root dilation (HCC)   . Congenital heart disease    bicuspid aortic valve with coarctation repair 04/1974  . Depression   . Edema   . Fibrocystic breast   . GERD (gastroesophageal reflux disease)    takes  Zantac daily as needed  . HPV (human papilloma virus) infection   . Seizures (HCC)    as a baby and only 1 time d/t high fever  . Shortness of breath dyspnea    with exertion  . Sinus infection    Nov 16 and treated with antibiotic  . TMJ (dislocation of temporomandibular joint)    Wears a night gear   . UTI (lower urinary tract infection)    hx of   . Yeast infection    08/17/14   Past Surgical History:  Procedure Laterality Date  . ASCENDING AORTIC ROOT REPLACEMENT N/A 09/03/2014   Procedure: ASCENDING AORTIC ROOT REPLACEMENT & VALVE SPARING ROOT;  Surgeon: Alleen Borne, MD;  Location: [facility] OR;  Service: Open Heart Surgery;  Laterality: N/A;  CIRC ARREST  . COARCTATION OF AORTA EXCISION  1975  . EYE SURGERY    . EYE SURGERY Bilateral 1985  . INTRAOPERATIVE TRANSESOPHAGEAL ECHOCARDIOGRAM N/A 09/03/2014   Procedure: INTRAOPERATIVE TRANSESOPHAGEAL ECHOCARDIOGRAM;  Surgeon: Alleen Borne, MD;  Location: [facility] OR;  Service: Open Heart Surgery;  Laterality: N/A;  . LEFT HEART CATHETERIZATION WITH CORONARY ANGIOGRAM N/A 08/06/2014   Procedure: LEFT HEART CATHETERIZATION WITH CORONARY ANGIOGRAM;  Surgeon: Wendall Stade, MD;  Location: [facility] CATH LAB;  Service: Cardiovascular;  Laterality: N/A;  .  TEE WITHOUT CARDIOVERSION N/A 08/06/2014   Procedure: TRANSESOPHAGEAL ECHOCARDIOGRAM (TEE);  Surgeon: Wendall Stade, MD;  Location[facility];  Service: Cardiovascular;  Laterality: N/A;   Social History  Substance Use Topics  . Smoking status: Never Smoker  . Smokeless tobacco: Never Used  . Alcohol use 0.0 oz/week     Comment: occasional   Family History  Problem Relation Age of Onset  . Hypertension Father   . Diabetes Father   . Cancer Father        lung   . Breast cancer Maternal Grandmother   . Leukemia Maternal Grandmother   . Ovarian cancer Paternal Grandmother   . Celiac disease Son    Allergies  Allergen Reactions  . Ace Inhibitors     REACTION: rash and cough  . Altace  [Ramipril] Hives and Itching   Current Outpatient Prescriptions on File Prior to Visit  Medication Sig Dispense Refill  . Biotin w/ Vitamins C & E (HAIR SKIN & NAILS GUMMIES PO) Take 1 tablet by mouth daily.    Marland Kitchen CALCIUM-VITAMIN D PO Take 2 tablets by mouth daily.      . FORFIVO XL 450 MG TB24 TAKE 1 TABLET BY MOUTH  DAILY 90 tablet 1  . ibuprofen (ADVIL,MOTRIN) 200 MG tablet Take 400 mg by mouth every 6 (six) hours as needed for headache.    . meloxicam (MOBIC) 15 MG tablet TAKE 1 TABLET BY MOUTH  DAILY 90 tablet 0  . metoprolol succinate (TOPROL-XL) 50 MG 24 hr tablet TAKE 1 TABLET BY MOUTH  DAILY WITH OR IMMEDIATLEY  FOLLOWING A MEAL 90 tablet 0  . Multiple Vitamin (MULTIVITAMIN) capsule Take 1 capsule by mouth daily.      Marland Kitchen PIRMELLA 7/7/7 0.5/0.75/1-35 MG-MCG tablet TAKE 1 TABLET BY MOUTH  DAILY 84 tablet 0  . Probiotic Product (ALIGN PO) Take 1 capsule by mouth daily.    . ranitidine (ZANTAC) 75 MG tablet Take 150 mg by mouth daily.     No current facility-administered medications on file prior to visit.      Review of Systems    Review of Systems  Constitutional: Negative for fever, appetite change, fatigue and unexpected weight change.  Eyes: Negative for pain and visual disturbance.  Respiratory: Negative for cough and shortness of breath.   Cardiovascular: Negative for cp or palpitations    Gastrointestinal: Negative for nausea, diarrhea and constipation.  Genitourinary: Negative for urgency and frequency. pos for strong urine odor  Skin: Negative for pallor or rash   MSK pos for R wrist pain  Neurological: Negative for weakness, light-headedness, numbness and headaches.  Hematological: Negative for adenopathy. Does not bruise/bleed easily.  Psychiatric/Behavioral: Negative for dysphoric mood. The patient is not nervous/anxious.      Objective:   Physical Exam  Constitutional: She appears well-developed and well-nourished. No distress.  HENT:  Head: Normocephalic and  atraumatic.  Eyes: Pupils are equal, round, and reactive to light. Conjunctivae and EOM are normal.  Neck: Normal range of motion. Neck supple.  Cardiovascular: Normal rate, regular rhythm and normal heart sounds.   Pulmonary/Chest: Effort normal and breath sounds normal.  Abdominal: Soft. Bowel sounds are normal. She exhibits no distension. There is no tenderness. There is no rebound.    Mild suprapubic tenderness  Musculoskeletal: She exhibits no edema.       Right wrist: She exhibits tenderness and bony tenderness. She exhibits normal range of motion, no swelling, no effusion, no crepitus, no deformity and no laceration.  R wrist- tender medially just distal to ulna  No mcp pain or tenderness Most painful to fully extend wrist and pronate/supinate No swelling or ecchymosis Nl perf and sens  Nl grip   Lymphadenopathy:    She has no cervical adenopathy.  Neurological: She is alert. No cranial nerve deficit. She exhibits normal muscle tone. Coordination normal.  Skin: No rash noted. No pallor.  Psychiatric: She has a normal mood and affect.          Assessment & Plan:   Problem List Items Addressed This Visit      Genitourinary   UTI (urinary tract infection)    Prev tx with online provider with septra (she thinks) Still has urine odor w/o other symptoms Urine cx sent  Enc water intake       Relevant Orders   Urine Culture     Other   Right wrist pain - Primary    After a fall about a mo ago on outstretched hand  Pain is medial  Swelling /bruising resolved Xray today  Plan to follow  Will use wrist splint/ace Relative rest  Ice prn  nsaid prn      Relevant Orders   DG Wrist Complete Right (Completed)

## 2017-06-02 NOTE — Patient Instructions (Addendum)
Xray of wrist today  Wrap in an ace bandage for driving as needed  No weight lifting for now or mowing Continue ice when you can   We will make a plan after it returns   We will send urine for a culture

## 2017-06-02 NOTE — Telephone Encounter (Signed)
Left voicemail requesting pt to call the office back 

## 2017-06-02 NOTE — Telephone Encounter (Signed)
Please let pt know that there is a small wrist fracture (triquetral) -- and avulsion (which means that a tiny pc of the bone was pulled off when she fell) and this correlates to her pain and swelling   Wrap her wrist in an ace for now and avoid using it   I will refer to orthopedics for further eval (it may need to be immobilized)  These type of fractures tend to heal well w/o complication   Sending to [place] to inform pt  Cc to [facility]

## 2017-06-02 NOTE — Telephone Encounter (Signed)
Pt notified of xray results and Dr. Royden Purl comments and instructions and verbalized understanding. She will await a call from our [facility]

## 2017-06-03 ENCOUNTER — Ambulatory Visit
Admission: RE | Admit: 2017-06-03 | Discharge: 2017-06-03 | Disposition: A | Discharge status: Home / Self Care | Payer: 59 | Source: Ambulatory Visit | Attending: Family Medicine | Admitting: Family Medicine

## 2017-06-03 ENCOUNTER — Ambulatory Visit: Admission: RE | Admit: 2017-06-03 | Payer: 59 | Source: Ambulatory Visit

## 2017-06-03 DIAGNOSIS — R928 Other abnormal and inconclusive findings on diagnostic imaging of breast: Secondary | ICD-10-CM

## 2017-06-03 NOTE — Assessment & Plan Note (Signed)
After a fall about a mo ago on outstretched hand  Pain is medial  Swelling /bruising resolved Xray today  Plan to follow  Will use wrist splint/ace Relative rest  Ice prn  nsaid prn

## 2017-06-03 NOTE — Assessment & Plan Note (Signed)
Prev tx with online provider with septra (she thinks) Still has urine odor w/o other symptoms Urine cx sent  Enc water intake

## 2017-06-06 ENCOUNTER — Telehealth: Payer: Self-pay | Admitting: Family Medicine

## 2017-06-06 LAB — URINE CULTURE

## 2017-06-06 MED ORDER — CIPROFLOXACIN HCL 250 MG PO TABS: 250.0000 mg | ORAL_TABLET | Freq: Two times a day (BID) | ORAL | 0 refills | Status: DC

## 2017-06-06 NOTE — Telephone Encounter (Signed)
Please let pt know her ucx is pos for infection  I want to tx her with cipro  Please call it in   If urine odor or other symptoms do not improve please let us know

## 2017-06-07 MED ORDER — CIPROFLOXACIN HCL 250 MG PO TABS: 250.0000 mg | ORAL_TABLET | Freq: Two times a day (BID) | ORAL | 0 refills | Status: DC

## 2017-06-07 NOTE — Telephone Encounter (Signed)
Pt notified of urine cx and Dr. Royden Purl comments and Rx sent to pharmacy

## 2017-06-08 ENCOUNTER — Encounter: Payer: Self-pay | Admitting: Obstetrics and Gynecology

## 2017-06-08 ENCOUNTER — Ambulatory Visit (INDEPENDENT_AMBULATORY_CARE_PROVIDER_SITE_OTHER): Payer: 59 | Admitting: Obstetrics and Gynecology

## 2017-06-08 VITALS — BP 122/70 | Ht 68.0 in | Wt 154.0 lb

## 2017-06-08 DIAGNOSIS — Z803 Family history of malignant neoplasm of breast: Secondary | ICD-10-CM

## 2017-06-08 DIAGNOSIS — Z1231 Encounter for screening mammogram for malignant neoplasm of breast: Secondary | ICD-10-CM

## 2017-06-08 DIAGNOSIS — Z87898 Personal history of other specified conditions: Secondary | ICD-10-CM | POA: Diagnosis not present

## 2017-06-08 DIAGNOSIS — Z1239 Encounter for other screening for malignant neoplasm of breast: Secondary | ICD-10-CM

## 2017-06-08 DIAGNOSIS — Z1151 Encounter for screening for human papillomavirus (HPV): Secondary | ICD-10-CM

## 2017-06-08 DIAGNOSIS — Z8742 Personal history of other diseases of the female genital tract: Secondary | ICD-10-CM

## 2017-06-08 DIAGNOSIS — Z3041 Encounter for surveillance of contraceptive pills: Secondary | ICD-10-CM

## 2017-06-08 DIAGNOSIS — Z01419 Encounter for gynecological examination (general) (routine) without abnormal findings: Secondary | ICD-10-CM | POA: Diagnosis not present

## 2017-06-08 DIAGNOSIS — Z124 Encounter for screening for malignant neoplasm of cervix: Secondary | ICD-10-CM

## 2017-06-08 DIAGNOSIS — Z8041 Family history of malignant neoplasm of ovary: Secondary | ICD-10-CM | POA: Insufficient documentation

## 2017-06-08 MED ORDER — NORETHIN-ETH ESTRAD TRIPHASIC 0.5/0.75/1-35 MG-MCG PO TABS: 1.0000 | ORAL_TABLET | Freq: Every day | ORAL | 3 refills | Status: DC

## 2017-06-08 NOTE — Progress Notes (Signed)
PCP:  Abner Greenspan, MD   Chief Complaint  Patient presents with  . Annual Exam     HPI:      [name] is a 46 y.o. [id number] who LMP was Patient's last menstrual period was 05/24/2017., presents today for her annual examination.  Her menses are regular every 28-30 days, lasting 4 days.  Dysmenorrhea mild, occurring first 1-2 days of flow. She does not have intermenstrual bleeding.  Sex activity: single partner, contraception - OCP (estrogen/progesterone).  Last Pap: Feb 24, 2016  Results were: no abnormalities /POS HPV DNA. Last colpo 04/26/15 with neg bx after ASCUS/POS HPV DNA pap 4/16. Repeat pap due today. Hx of STDs: HPV  Last mammogram: June 03, 2017  Results were: normal--routine follow-up in 12 months There is a FH of breast cancer in her MGM. There is a FH of ovarian cancer in her PGM. Pt tested BRCA neg 2014 through [facility] 2014=17%. Update testing discussed last yr but pt only wants through [facility].  The patient does not do self-breast exams.  Tobacco use: The patient denies current or previous tobacco use. Alcohol use: social drinker No drug use.  Exercise: moderately active  She does get adequate calcium and Vitamin D in her diet.  Labs with PCP.    Past Medical History:  Diagnosis Date  . Aortic root dilation (HCC)   . Congenital heart disease    bicuspid aortic valve with coarctation repair 04/1974  . Depression   . Edema   . Fibrocystic breast   . GERD (gastroesophageal reflux disease)    takes Zantac daily as needed  . HPV (human papilloma virus) infection   . Seizures ([place])    as a baby and only 1 time d/t high fever  . Shortness of breath dyspnea    with exertion  . Sinus infection    Nov 16 and treated with antibiotic  . TMJ (dislocation of temporomandibular joint)    Wears a night gear   . UTI (lower urinary tract infection)    hx of   . Yeast infection    08/17/14    Past Surgical History:  Procedure Laterality Date  .  ASCENDING AORTIC ROOT REPLACEMENT N/A 09/03/2014   Procedure: ASCENDING AORTIC ROOT REPLACEMENT & VALVE SPARING ROOT;  Surgeon: Gaye Pollack, MD;  Location: [place];  Service: Open Heart Surgery;  Laterality: N/A;  CIRC ARREST  . COARCTATION OF AORTA EXCISION  1975  . EYE SURGERY    . EYE SURGERY Bilateral 1985  . INTRAOPERATIVE TRANSESOPHAGEAL ECHOCARDIOGRAM N/A 09/03/2014   Procedure: INTRAOPERATIVE TRANSESOPHAGEAL ECHOCARDIOGRAM;  Surgeon: Gaye Pollack, MD;  Location: [facility] OR;  Service: Open Heart Surgery;  Laterality: N/A;  . LEFT HEART CATHETERIZATION WITH CORONARY ANGIOGRAM N/A 08/06/2014   Procedure: LEFT HEART CATHETERIZATION WITH CORONARY ANGIOGRAM;  Surgeon: Josue Hector, MD;  Location: [facility] CATH LAB;  Service: Cardiovascular;  Laterality: N/A;  . TEE WITHOUT CARDIOVERSION N/A 08/06/2014   Procedure: TRANSESOPHAGEAL ECHOCARDIOGRAM (TEE);  Surgeon: Josue Hector, MD;  Location: [facility] ENDOSCOPY;  Service: Cardiovascular;  Laterality: N/A;    Family History  Problem Relation Age of Onset  . Hypertension Father   . Diabetes Father   . Cancer Father        lung   . Breast cancer Maternal Grandmother   . Leukemia Maternal Grandmother   . Ovarian cancer Paternal Grandmother   . Celiac disease Son     Social History   Social  History  . Marital status: Divorced    Spouse name: N/A  . Number of children: 2  . Years of education: N/A   Occupational History  . Not on file.   Social History Main Topics  . Smoking status: Never Smoker  . Smokeless tobacco: Never Used  . Alcohol use 0.0 oz/week     Comment: occasional  . Drug use: No  . Sexual activity: Yes   Other Topics Concern  . Not on file   Social History Narrative  . No narrative on file    No outpatient prescriptions have been marked as taking for the 06/08/17 encounter (Office Visit) with Kalina Morabito, Deirdre Evener, PA-C.     ROS:  Review of Systems  Constitutional: Negative for fatigue, fever and unexpected weight  change.  Respiratory: Negative for cough, shortness of breath and wheezing.   Cardiovascular: Negative for chest pain, palpitations and leg swelling.  Gastrointestinal: Negative for blood in stool, constipation, diarrhea, nausea and vomiting.  Endocrine: Negative for cold intolerance, heat intolerance and polyuria.  Genitourinary: Negative for dyspareunia, dysuria, flank pain, frequency, genital sores, hematuria, menstrual problem, pelvic pain, urgency, vaginal bleeding, vaginal discharge and vaginal pain.  Musculoskeletal: Negative for back pain, joint swelling and myalgias.  Skin: Negative for rash.  Neurological: Negative for dizziness, syncope, light-headedness, numbness and headaches.  Hematological: Negative for adenopathy.  Psychiatric/Behavioral: Negative for agitation, confusion, sleep disturbance and suicidal ideas. The patient is not nervous/anxious.      Objective: BP 122/70   Ht '5\' 8"'$  (1.727 m)   Wt 154 lb (69.9 kg)   LMP 05/24/2017   BMI 23.42 kg/m    Physical Exam  Constitutional: She is oriented to person, place, and time. She appears well-developed and well-nourished.  Genitourinary: Vagina normal and uterus normal. There is no rash or tenderness on the right labia. There is no rash or tenderness on the left labia. No erythema or tenderness in the vagina. No vaginal discharge found. Right adnexum does not display mass and does not display tenderness. Left adnexum does not display mass and does not display tenderness. Cervix does not exhibit motion tenderness or polyp. Uterus is not enlarged or tender.  Neck: Normal range of motion. No thyromegaly present.  Cardiovascular: Normal rate, regular rhythm and normal heart sounds.   No murmur heard. Pulmonary/Chest: Effort normal and breath sounds normal. Right breast exhibits no mass, no nipple discharge, no skin change and no tenderness. Left breast exhibits no mass, no nipple discharge, no skin change and no tenderness.    Abdominal: Soft. There is no tenderness. There is no guarding.  Musculoskeletal: Normal range of motion.  Neurological: She is alert and oriented to person, place, and time. No cranial nerve deficit.  Psychiatric: She has a normal mood and affect. Her behavior is normal.  Vitals reviewed.   Assessment/Plan: Encounter for annual routine gynecological examination  Cervical cancer screening - Plan: IGP, Aptima HPV  Screening for HPV (human papillomavirus) - Plan: IGP, Aptima HPV  History of abnormal cervical Pap smear - Will call pt with results.  Screening for breast cancer - Pt had mammo 06/03/17.   Encounter for surveillance of contraceptive pills - OCP RF. - Plan: norethindrone-ethinyl estradiol ([place] 7/7/7) 0.5/0.75/1-35 MG-MCG tablet  Family history of ovarian cancer - Pt is BRCA neg. Discussed update testing. Pt to find out what tests are offered at [facility] for this and then we can order.   Family history of breast cancer  Meds ordered this encounter  Medications  . norethindrone-ethinyl estradiol (PIRMELLA 7/7/7) 0.5/0.75/1-35 MG-MCG tablet    Sig: Take 1 tablet by mouth daily.    Dispense:  84 tablet    Refill:  3             GYN counsel breast self exam, mammography screening, adequate intake of calcium and vitamin D     F/U  Return in about 1 year (around 06/08/2018).  Natalee Tomkiewicz B. Otniel Hoe, PA-C 06/08/2017 2:03 PM

## 2017-06-10 ENCOUNTER — Telehealth: Payer: Self-pay

## 2017-06-10 DIAGNOSIS — Z3041 Encounter for surveillance of contraceptive pills: Secondary | ICD-10-CM

## 2017-06-10 MED ORDER — NORETHIN-ETH ESTRAD TRIPHASIC 0.5/0.75/1-35 MG-MCG PO TABS: 1.0000 | ORAL_TABLET | Freq: Every day | ORAL | 3 refills | Status: DC

## 2017-06-10 NOTE — Telephone Encounter (Signed)
Information requested by [employer].  Called pt to verify information.  She uses [facility].  Adv would send rx to [facility].

## 2017-06-11 LAB — IGP, APTIMA HPV
HPV Aptima: POSITIVE — AB
PAP SMEAR COMMENT: 0

## 2017-06-15 ENCOUNTER — Telehealth: Payer: Self-pay | Admitting: Obstetrics and Gynecology

## 2017-06-15 NOTE — Telephone Encounter (Signed)
Pt aware of abn pap, ASCUS/POS HPV DNA. Hx of abn paps. Last colpo 7/16. Due for repeat colpo. Sched with Dr. Jean RosenthalJackson.

## 2017-06-28 ENCOUNTER — Other Ambulatory Visit: Payer: Self-pay | Admitting: Family Medicine

## 2017-06-29 NOTE — Telephone Encounter (Signed)
CPE was on 04/23/17, last filled on 01/14/17 #90 tabs with 1 additional refill, please advise

## 2017-06-29 NOTE — Telephone Encounter (Signed)
Will refill electronically  

## 2017-07-01 ENCOUNTER — Other Ambulatory Visit: Payer: Self-pay | Admitting: Cardiovascular Disease

## 2017-07-08 ENCOUNTER — Ambulatory Visit (INDEPENDENT_AMBULATORY_CARE_PROVIDER_SITE_OTHER): Payer: 59 | Admitting: Obstetrics and Gynecology

## 2017-07-08 VITALS — BP 122/74 | Wt 152.0 lb

## 2017-07-08 DIAGNOSIS — R87610 Atypical squamous cells of undetermined significance on cytologic smear of cervix (ASC-US): Secondary | ICD-10-CM | POA: Diagnosis not present

## 2017-07-08 DIAGNOSIS — R87810 Cervical high risk human papillomavirus (HPV) DNA test positive: Secondary | ICD-10-CM | POA: Diagnosis not present

## 2017-07-08 NOTE — Progress Notes (Signed)
HPI:  Elizabeth Hayden is a 46 y.o.  [id number]  who presents today for evaluation and management of abnormal cervical cytology.    Dysplasia History:   2016: ASCUS, HPV +, colposcopy negative 2017: NILM, HPV 18 positive (no colposcopy) 2018: ASCUS, HPV+  OB History  Gravida Para Term Preterm AB Living  SAB TAB Ectopic Multiple Live Births          2    # Outcome Date GA Lbr Len/2nd Weight Sex Delivery Anes PTL Lv  3 AB           2 Term           1 Term               Past Medical History:  Diagnosis Date  . Aortic root dilation (HCC)   . Congenital heart disease    bicuspid aortic valve with coarctation repair 04/1974  . Depression   . Edema   . Fibrocystic breast   . GERD (gastroesophageal reflux disease)    takes Zantac daily as needed  . HPV (human papilloma virus) infection   . Seizures (HCC)    as a baby and only 1 time d/t high fever  . Shortness of breath dyspnea    with exertion  . Sinus infection    Nov 16 and treated with antibiotic  . TMJ (dislocation of temporomandibular joint)    Wears a night gear   . UTI (lower urinary tract infection)    hx of   . Yeast infection    08/17/14    Past Surgical History:  Procedure Laterality Date  . ASCENDING AORTIC ROOT REPLACEMENT N/A 09/03/2014   Procedure: ASCENDING AORTIC ROOT REPLACEMENT & VALVE SPARING ROOT;  Surgeon: Alleen Borne, MD;  Location: [facility] OR;  Service: Open Heart Surgery;  Laterality: N/A;  CIRC ARREST  . COARCTATION OF AORTA EXCISION  1975  . EYE SURGERY    . EYE SURGERY Bilateral 1985  . INTRAOPERATIVE TRANSESOPHAGEAL ECHOCARDIOGRAM N/A 09/03/2014   Procedure: INTRAOPERATIVE TRANSESOPHAGEAL ECHOCARDIOGRAM;  Surgeon: Alleen Borne, MD;  Location: [facility] OR;  Service: Open Heart Surgery;  Laterality: N/A;  . LEFT HEART CATHETERIZATION WITH CORONARY ANGIOGRAM N/A 08/06/2014   Procedure: LEFT HEART CATHETERIZATION WITH CORONARY ANGIOGRAM;  Surgeon: Wendall Stade, MD;  Location: [facility] CATH  LAB;  Service: Cardiovascular;  Laterality: N/A;  . TEE WITHOUT CARDIOVERSION N/A 08/06/2014   Procedure: TRANSESOPHAGEAL ECHOCARDIOGRAM (TEE);  Surgeon: Wendall Stade, MD;  Location[facility] ENDOSCOPY;  Service: Cardiovascular;  Laterality: N/A;    SOCIAL HISTORY:  History  Alcohol Use  . 0.0 oz/week    Comment: occasional    History  Drug Use No     Family History  Problem Relation Age of Onset  . Hypertension Father   . Diabetes Father   . Cancer Father        lung   . Breast cancer Maternal Grandmother   . Leukemia Maternal Grandmother   . Ovarian cancer Paternal Grandmother   . Celiac disease Son     ALLERGIES:  Ace inhibitors and Altace [ramipril]  Current Outpatient Prescriptions on File Prior to Visit  Medication Sig Dispense Refill  . Biotin w/ Vitamins C & E (HAIR SKIN & NAILS GUMMIES PO) Take 1 tablet by mouth daily.    Marland Kitchen CALCIUM-VITAMIN D PO Take 2 tablets by mouth daily.      . ciprofloxacin (CIPRO) 250 MG  tablet Take 1 tablet (250 mg total) by mouth 2 (two) times daily. 10 tablet 0  . FORFIVO XL 450 MG TB24 TAKE 1 TABLET BY MOUTH  DAILY 90 tablet 2  . ibuprofen (ADVIL,MOTRIN) 200 MG tablet Take 400 mg by mouth every 6 (six) hours as needed for headache.    . meloxicam (MOBIC) 15 MG tablet TAKE 1 TABLET BY MOUTH  DAILY 90 tablet 0  . metoprolol succinate (TOPROL-XL) 50 MG 24 hr tablet TAKE 1 TABLET BY MOUTH  DAILY WITH OR IMMEDIATLEY  FOLLOWING A MEAL 90 tablet 0  . Multiple Vitamin (MULTIVITAMIN) capsule Take 1 capsule by mouth daily.      . norethindrone-ethinyl estradiol (PIRMELLA 7/7/7) 0.5/0.75/1-35 MG-MCG tablet Take 1 tablet by mouth daily. 84 tablet 3  . Probiotic Product (ALIGN PO) Take 1 capsule by mouth daily.    . ranitidine (ZANTAC) 75 MG tablet Take 150 mg by mouth daily.     No current facility-administered medications on file prior to visit.     Physical Exam: -Vitals:  BP 122/74   Wt 152 lb (68.9 kg)   LMP 06/24/2017   BMI 23.11 kg/m    GEN: WD, WN, NAD.  A+ O x 3, good mood and affect. ABD:  NT, ND.  Soft, no masses.  No hernias noted.   Pelvic:   Vulva: Normal appearance.  No lesions.  Vagina: No lesions or abnormalities noted.  Support: Normal pelvic support.  Urethra No masses tenderness or scarring.  Meatus Normal size without lesions or prolapse.  Cervix: See below.  Anus: Normal exam.  No lesions.  Perineum: Normal exam.  No lesions.        Bimanual   Uterus: Normal size.  Non-tender.  Mobile.  AV.  Adnexae: No masses.  Non-tender to palpation.  Cul-de-sac: Negative for abnormality.   PROCEDURE: 1.  Urine Pregnancy Test:  negative 2.  Colposcopy performed with 4% acetic acid after verbal consent obtained                                         -Aceto-white Lesions Location(s): 7, 12 o'clock.              -Biopsy performed at 7 and 12 o'clock               -ECC indicated and performed: Yes.       -Biopsy sites made hemostatic with pressure, AgNO3, and/or Monsel's solution   -Satisfactory colposcopy: No.    -Evidence of Invasive cervical CA :  NO  ASSESSMENT:  Elizabeth Hayden is a 46 y.o. [id number] here for  1. ASCUS with positive high risk HPV cervical   2. CERV HIGH RISK HUMAN PAPILLOMAVIRUS DNA TEST POS   .  PLAN:  I discussed the grading system of pap smears and HPV high risk viral types.  We will discuss and base management after colpo results return.     [name], MD  [facility], [facility] 07/08/2017  2:38 PM

## 2017-07-13 LAB — PATHOLOGY

## 2017-07-16 ENCOUNTER — Telehealth: Payer: Self-pay | Admitting: Obstetrics and Gynecology

## 2017-07-16 NOTE — Telephone Encounter (Signed)
Pt has called back. °

## 2017-07-16 NOTE — Telephone Encounter (Signed)
Left generic VM 

## 2017-07-16 NOTE — Telephone Encounter (Signed)
Discussed negative results. Recommend follow up in 1 year with pap smear with HPV.

## 2017-08-16 NOTE — Progress Notes (Signed)
Patient ID: Elizabeth Hayden, female   DOB: May 15, 1971, 46 y.o.   MRN: [id number] Elizabeth Hayden returns today for followup. She has a history of bicuspid aortic valve with coarctation repair.. She has had a long standing history of  chronic edema, this tends to be generalizes body edema both in her legs, and her hands and in her face.   Doing better finally divorced    Son has Downs Syndrome  They do aerobics together at the [facility] in [facility] graduated from [place] and doing HLA typing at [employer] F/U MRI / MRA done 07/23/14 and aortic root now 5.0cm  IMPRESSION:  1) Increase in size of ascending aortic aneurysm 5.0 cm compared to  11/03/11 where it measured 4.5 cm.  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 Valve-sparing aortic root replacement with reimplantation of the native aortic valve and left and right coronary arteries ( 28 mm Gelweave valsalva graft). 4. Replacement of ascending aortic aneurysm with a 28 mm Hemashield graft using deep hypothermic circulatory arrest.  Echo  10/31/14  Reviewed and normal EF no significant AR/AS and no coarctation gradient   Study Conclusions  - Left ventricle: The cavity size was normal. Wall thickness was normal. Systolic function was normal. The estimated ejection fraction was in the range of 55% to 60%. - Aortic valve: Not well seen but known to be bicuspid with valve sparing surgery no signficiant AS/AR. Valve area (VTI): 1.73 cm^2. Valve area (Vmax): 1.66 cm^2. Valve area (Vmean): 1.63 cm^2. - Aorta: Normal appearance of root replacement with no residual aneurysm. - Mitral valve: Valve area by  pressure half-time: 2.47 cm^2. Valve area by continuity equation (using LVOT flow): 1.72 cm^2. - Atrial septum: No defect or patent foramen ovale was identified. - Impressions: Previous coarctation repair. No velocities recorded over 64msec on suprasternal notch images of the descending thoracic aorta  Impressions:  - Previous coarctation repair. No velocities recorded over 265mec on suprasternal notch images of the descending thoracic aorta   ROS: Denies fever, malais, weight loss, blurry vision, decreased visual acuity, cough, sputum, SOB, hemoptysis, pleuritic pain, palpitaitons, heartburn, abdominal pain, melena, lower extremity edema, claudication, or rash.  All other systems reviewed and negative  General: BP 138/84   Pulse 69   Ht '5\' 8"'  (1.727 m)   Wt 150 lb 8 oz (68.3 kg)   SpO2 98%   BMI 22.88 kg/m  Affect appropriate Healthy:  appears stated age HE38normal Neck supple with no adenopathy JVP normal no bruits no thyromegaly Lungs clear with no wheezing and good diaphragmatic motion Heart:  S1/S2 SEM murmur, no rub, gallop or click PMI normal Abdomen: benighn, BS positve, no tenderness, no AAA no bruit.  No HSM or HJR Distal pulses intact with no bruits No edema Neuro non-focal Skin warm and dry No muscular weakness    Current Outpatient Medications  Medication Sig Dispense Refill  . Biotin w/ Vitamins C & E (HAIR SKIN & NAILS GUMMIES PO) Take 1 tablet by mouth daily.    . Marland KitchenALCIUM-VITAMIN D PO Take 2 tablets by mouth daily.      . FORFIVO XL 450 MG  TB24 TAKE 1 TABLET BY MOUTH  DAILY 90 tablet 2  . ibuprofen (ADVIL,MOTRIN) 200 MG tablet Take 400 mg by mouth every 6 (six) hours as needed for headache.    . Ibuprofen-Famotidine (DUEXIS) 800-26.6 MG TABS Take 1 tablet 3 (three) times daily by mouth.    . metoprolol succinate (TOPROL-XL) 50 MG 24 hr tablet TAKE 1 TABLET BY MOUTH  DAILY WITH OR IMMEDIATLEY  FOLLOWING A MEAL 90 tablet 0  . Multiple Vitamin  (MULTIVITAMIN) capsule Take 1 capsule by mouth daily.      . norethindrone-ethinyl estradiol (PIRMELLA 7/7/7) 0.5/0.75/1-35 MG-MCG tablet Take 1 tablet by mouth daily. 84 tablet 3  . Probiotic Product (ALIGN PO) Take 1 capsule by mouth daily.    . ranitidine (ZANTAC) 75 MG tablet Take 150 mg by mouth daily.     No current facility-administered medications for this visit.     Allergies  Ace inhibitors and Altace [ramipril]  Electrocardiogram:   09/21/14 SR LAFB  LAE  Rate 85  05/13/16  SR rate 79 Biatrial enlargement LAFB  08/20/17 SR rate 68 LAE LAFB nonspecific ST changes  Assessment and Plan Aortic Aneurysm:  Post root replacement On beta blocker  Bicuspid AV:  Valve sparing surgery no significant AR/AS  SBE given gortex root update echo next year  Abberant Left Sublavian:  No evidence of impingement normal UE pulses Edema: low sodium diet generalized consider PRN diuretic GERD:  On zantac low carb diet   No tests.  F/U in a year   Jenkins Rouge

## 2017-08-20 ENCOUNTER — Encounter: Payer: Self-pay | Admitting: Cardiovascular Disease

## 2017-08-20 ENCOUNTER — Ambulatory Visit: Payer: 59 | Admitting: Cardiovascular Disease

## 2017-08-20 VITALS — BP 138/84 | HR 69 | Ht 68.0 in | Wt 150.5 lb

## 2017-08-20 DIAGNOSIS — I359 Nonrheumatic aortic valve disorder, unspecified: Secondary | ICD-10-CM | POA: Diagnosis not present

## 2017-08-20 NOTE — Patient Instructions (Addendum)

## 2017-09-20 ENCOUNTER — Other Ambulatory Visit: Payer: Self-pay | Admitting: Cardiovascular Disease

## 2017-11-25 ENCOUNTER — Other Ambulatory Visit: Payer: Self-pay | Admitting: Podiatry

## 2018-01-06 ENCOUNTER — Other Ambulatory Visit: Payer: Self-pay | Admitting: Obstetrics and Gynecology

## 2018-01-06 ENCOUNTER — Other Ambulatory Visit: Payer: Self-pay | Admitting: Cardiovascular Disease

## 2018-01-06 DIAGNOSIS — Z3041 Encounter for surveillance of contraceptive pills: Secondary | ICD-10-CM

## 2018-01-06 NOTE — Telephone Encounter (Signed)
Outpatient Medication Detail    Disp Refills Start End   metoprolol succinate (TOPROL-XL) 50 MG 24 hr tablet 90 tablet 3 09/23/2017    Sig: TAKE 1 TABLET BY MOUTH DAILY WITH OR IMMEDIATLEY FOLLOWING A MEAL   Sent to pharmacy as: metoprolol succinate (TOPROL-XL) 50 MG 24 hr tablet   E-Prescribing Status: Receipt confirmed by pharmacy (09/23/2017 8:51 AM EST)   [facility] [place] [phone]

## 2018-03-23 ENCOUNTER — Other Ambulatory Visit: Payer: Self-pay | Admitting: Family Medicine

## 2018-03-24 NOTE — Telephone Encounter (Signed)
Please schedule PE and refill until then  

## 2018-03-24 NOTE — Telephone Encounter (Signed)
Last CPE was 04/23/17 and no future appts scheduled. Please advise

## 2018-03-25 ENCOUNTER — Telehealth: Payer: Self-pay | Admitting: Family Medicine

## 2018-03-25 DIAGNOSIS — Z0000 Encounter for general adult medical examination without abnormal findings: Secondary | ICD-10-CM

## 2018-03-25 DIAGNOSIS — R7309 Other abnormal glucose: Secondary | ICD-10-CM | POA: Insufficient documentation

## 2018-03-25 NOTE — Telephone Encounter (Signed)
Patient scheduled CPX with Dr.Tower on 05/20/18.  Patient works at [employer] and is requesting a lab order be put in [employer] [facility] [employer], so she can have her labs done before the appointment.

## 2018-03-25 NOTE — Telephone Encounter (Signed)
Med refilled and Lyla SonCarrie will reach out to pt to try to get appt scheduled

## 2018-03-25 NOTE — Telephone Encounter (Signed)
I did future order for [facility] lab collect  Let me know if I need to change anything Thanks

## 2018-03-25 NOTE — Telephone Encounter (Signed)
Pt notified lab orders are in and she will get them done before her CPE, Camelia Engerri will release labs to [facility]

## 2018-04-04 ENCOUNTER — Ambulatory Visit: Payer: Managed Care, Other (non HMO) | Admitting: Internal Medicine

## 2018-04-04 ENCOUNTER — Encounter: Payer: Self-pay | Admitting: Internal Medicine

## 2018-04-04 VITALS — BP 128/76 | HR 74 | Temp 98.8°F | Wt 149.0 lb

## 2018-04-04 DIAGNOSIS — S80869A Insect bite (nonvenomous), unspecified lower leg, initial encounter: Secondary | ICD-10-CM

## 2018-04-04 DIAGNOSIS — W57XXXA Bitten or stung by nonvenomous insect and other nonvenomous arthropods, initial encounter: Secondary | ICD-10-CM

## 2018-04-04 MED ORDER — TRIAMCINOLONE ACETONIDE 0.5 % EX OINT: 1.0000 "application " | TOPICAL_OINTMENT | Freq: Two times a day (BID) | CUTANEOUS | 0 refills | Status: DC

## 2018-04-04 NOTE — Progress Notes (Signed)
Subjective:    Patient ID: Elizabeth Hayden, female    DOB: 05/29/1971, 47 y.o.   MRN: [id number]  HPI  Pt presents to the clinic today with c/o 2 insect bites. 1 is on her left lower leg, and the other is on the right ankle. She reports this occurred 10 days ago. The bites have been extremely itchy over the last 2 days. She has noticed some increased redness around the bites. She has tried Vinegar with minimal relief. She has not pulled any ticks off of her.   Review of Systems  Past Medical History:  Diagnosis Date  . Aortic root dilation (HCC)   . Congenital heart disease    bicuspid aortic valve with coarctation repair 04/1974  . Depression   . Edema   . Fibrocystic breast   . GERD (gastroesophageal reflux disease)    takes Zantac daily as needed  . HPV (human papilloma virus) infection   . Seizures (HCC)    as a baby and only 1 time d/t high fever  . Shortness of breath dyspnea    with exertion  . Sinus infection    Nov 16 and treated with antibiotic  . TMJ (dislocation of temporomandibular joint)    Wears a night gear   . UTI (lower urinary tract infection)    hx of   . Yeast infection    08/17/14    Current Outpatient Medications  Medication Sig Dispense Refill  . Biotin w/ Vitamins C & E (HAIR SKIN & NAILS GUMMIES PO) Take 1 tablet by mouth daily.    Marland Kitchen. CALCIUM-VITAMIN D PO Take 2 tablets by mouth daily.      . FORFIVO XL 450 MG TB24 TAKE 1 TABLET BY MOUTH  DAILY 90 tablet 0  . ibuprofen (ADVIL,MOTRIN) 200 MG tablet Take 400 mg by mouth every 6 (six) hours as needed for headache.    . Ibuprofen-Famotidine (DUEXIS) 800-26.6 MG TABS Take 1 tablet 3 (three) times daily by mouth.    . metoprolol succinate (TOPROL-XL) 50 MG 24 hr tablet TAKE 1 TABLET BY MOUTH  DAILY WITH OR IMMEDIATLEY  FOLLOWING A MEAL 90 tablet 3  . Multiple Vitamin (MULTIVITAMIN) capsule Take 1 capsule by mouth daily.      Marland Kitchen. NORTREL 7/7/7 0.5/0.75/1-35 MG-MCG tablet TAKE 1 TABLET BY MOUTH  DAILY 84  tablet 1  . Probiotic Product (ALIGN PO) Take 1 capsule by mouth daily.    . ranitidine (ZANTAC) 75 MG tablet Take 150 mg by mouth daily.     No current facility-administered medications for this visit.     Allergies  Allergen Reactions  . Ace Inhibitors     REACTION: rash and cough  . Altace [Ramipril] Hives and Itching    Family History  Problem Relation Age of Onset  . Hypertension Father   . Diabetes Father   . Cancer Father        lung   . Breast cancer Maternal Grandmother   . Leukemia Maternal Grandmother   . Ovarian cancer Paternal Grandmother   . Celiac disease Son     Social History   Socioeconomic History  . Marital status: Divorced    Spouse name: Not on file  . Number of children: 2  . Years of education: Not on file  . Highest education level: Not on file  Occupational History  . Not on file  Social Needs  . Financial resource strain: Not on file  . Food insecurity:  Worry: Not on file    Inability: Not on file  . Transportation needs:    Medical: Not on file    Non-medical: Not on file  Tobacco Use  . Smoking status: Never Smoker  . Smokeless tobacco: Never Used  Substance and Sexual Activity  . Alcohol use: Yes    Alcohol/week: 0.0 oz    Comment: occasional  . Drug use: No  . Sexual activity: Yes  Lifestyle  . Physical activity:    Days per week: Not on file    Minutes per session: Not on file  . Stress: Not on file  Relationships  . Social connections:    Talks on phone: Not on file    Gets together: Not on file    Attends religious service: Not on file    Active member of club or organization: Not on file    Attends meetings of clubs or organizations: Not on file    Relationship status: Not on file  . Intimate partner violence:    Fear of current or ex partner: Not on file    Emotionally abused: Not on file    Physically abused: Not on file    Forced sexual activity: Not on file  Other Topics Concern  . Not on file  Social  History Narrative  . Not on file     Constitutional: Denies fever, malaise, fatigue, headache or abrupt weight changes.  Skin: Pt reports insect bites. Denies ulcercations.    No other specific complaints in a complete review of systems (except as listed in HPI above).     Objective:   Physical Exam   BP 128/76   Pulse 74   Temp 98.8 F (37.1 C) (Oral)   Wt 149 lb (67.6 kg)   LMP 03/21/2018   SpO2 99%   BMI 22.66 kg/m  Wt Readings from Last 3 Encounters:  04/04/18 149 lb (67.6 kg)  08/20/17 150 lb 8 oz (68.3 kg)  07/08/17 152 lb (68.9 kg)    General: Appears her stated age, well developed, well nourished in NAD. Skin: Insect bite noted to right medial ankle, with surrounding excoriation. Insect bite noted to left lateral lower leg with surrounding excoriation.  BMET    Component Value Date/Time   NA 142 03/23/2017 0817   K 3.8 03/23/2017 0817   CL 104 03/23/2017 0817   CO2 24 03/23/2017 0817   GLUCOSE 105 (H) 03/23/2017 0817   GLUCOSE 114 (H) 09/07/2014 0300   BUN 10 03/23/2017 0817   CREATININE 0.88 03/23/2017 0817   CALCIUM 8.9 03/23/2017 0817   GFRNONAA 78 03/23/2017 0817   GFRAA 90 03/23/2017 0817    Lipid Panel     Component Value Date/Time   CHOL 178 03/23/2017 0817   TRIG 84 03/23/2017 0817   HDL 54 03/23/2017 0817   CHOLHDL 3.3 03/23/2017 0817   CHOLHDL 3.4 CALC 11/13/2008 1030   VLDL 9 11/13/2008 1030   LDLCALC 107 (H) 03/23/2017 0817    CBC    Component Value Date/Time   WBC 5.9 03/23/2017 0817   WBC 8.8 09/08/2014 0957   RBC 4.08 03/23/2017 0817   RBC 2.72 (L) 09/08/2014 0957   HGB 12.8 03/23/2017 0817   HCT 38.2 03/23/2017 0817   PLT 284 03/23/2017 0817   MCV 94 03/23/2017 0817   MCH 31.4 03/23/2017 0817   MCH 30.5 09/08/2014 0957   MCHC 33.5 03/23/2017 0817   MCHC 32.3 09/08/2014 0957   RDW 13.2 03/23/2017 0817  LYMPHSABS 1.9 03/23/2017 0817   MONOABS 1.0 11/13/2008 1030   EOSABS 0.1 03/23/2017 0817   BASOSABS 0.0  03/23/2017 0817    Hgb A1C Lab Results  Component Value Date   HGBA1C 5.2 08/31/2014          Assessment & Plan:   Insect Bite, Bilateral Lower Legs:  eRx for Triamcinolone Cream 0.1% BID until resolved Avoid scratching Try Claritin OTC  Return precautions discussed Nicki Reaper, NP

## 2018-04-04 NOTE — Patient Instructions (Signed)
Insect Bite, Adult An insect bite can make your skin red, itchy, and swollen. Some insects can spread disease to people with a bite. However, most insect bites do not lead to disease, and most are not serious. Follow these instructions at home: Bite area care  Do not scratch the bite area.  Keep the bite area clean and dry.  Wash the bite area every day with soap and water as told by your doctor.  Check the bite area every day for signs of infection. Check for: ? More redness, swelling, or pain. ? Fluid or blood. ? Warmth. ? Pus. Managing pain, itching, and swelling  You may put any of these on the bite area as told by your doctor: ? A baking soda paste. ? Cortisone cream. ? Calamine lotion.  If directed, put ice on the bite area. ? Put ice in a plastic bag. ? Place a towel between your skin and the bag. ? Leave the ice on for 20 minutes, 2-3 times a day. Medicines  Take medicines or put medicines on your skin only as told by your doctor.  If you were prescribed an antibiotic medicine, use it as told by your doctor. Do not stop using the antibiotic even if your condition improves. General instructions  Keep all follow-up visits as told by your doctor. This is important. How is this prevented? To help you have a lower risk of insect bites:  When you are outside, wear clothing that covers your arms and legs.  Use insect repellent. The best insect repellents have: ? An active ingredient of DEET, picaridin, oil of lemon eucalyptus (OLE), or IR3535. ? Higher amounts of DEET or another active ingredient than other repellents have.  If your home windows do not have screens, think about putting some in.  Contact a doctor if:  You have more redness, swelling, or pain in the bite area.  You have fluid, blood, or pus coming from the bite area.  The bite area feels warm.  You have a fever. Get help right away if:  You have joint pain.  You have a rash.  You have  shortness of breath.  You feel more tired or sleepy than you normally do.  You have neck pain.  You have a headache.  You feel weaker than you normally do.  You have chest pain.  You have pain in your belly.  You feel sick to your stomach (nauseous) or you throw up (vomit). Summary  An insect bite can make your skin red, itchy, and swollen.  Do not scratch the bite area, and keep it clean and dry.  Ice can help with pain and itching from the bite. This information is not intended to replace advice given to you by your health care provider. Make sure you discuss any questions you have with your health care provider. Document Released: 09/25/2000 Document Revised: 04/30/2016 Document Reviewed: 02/13/2015 Elsevier Interactive Patient Education  2018 [employer]  

## 2018-05-07 LAB — LIPID PANEL
Chol/HDL Ratio: 3.3 ratio (ref 0.0–4.4)
Cholesterol, Total: 212 mg/dL — ABNORMAL HIGH (ref 100–199)
HDL: 65 mg/dL (ref >39)
LDL Calculated: 128 mg/dL — ABNORMAL HIGH (ref 0–99)
Triglycerides: 93 mg/dL (ref 0–149)
VLDL Cholesterol Cal: 19 mg/dL (ref 5–40)

## 2018-05-07 LAB — CBC WITH DIFFERENTIAL/PLATELET
BASOS ABS: 0 10*3/uL (ref 0.0–0.2)
Basos: 0 %
EOS (ABSOLUTE): 0.1 10*3/uL (ref 0.0–0.4)
Eos: 2 %
Hematocrit: 42.2 % (ref 34.0–46.6)
Hemoglobin: 14 g/dL (ref 11.1–15.9)
Immature Grans (Abs): 0 10*3/uL (ref 0.0–0.1)
Immature Granulocytes: 0 %
LYMPHS: 28 %
Lymphocytes Absolute: 1.9 10*3/uL (ref 0.7–3.1)
MCH: 31 pg (ref 26.6–33.0)
MCHC: 33.2 g/dL (ref 31.5–35.7)
MCV: 93 fL (ref 79–97)
Monocytes Absolute: 0.6 10*3/uL (ref 0.1–0.9)
Monocytes: 8 %
Neutrophils Absolute: 4.2 10*3/uL (ref 1.4–7.0)
Neutrophils: 62 %
PLATELETS: 305 10*3/uL (ref 150–450)
RBC: 4.52 x10E6/uL (ref 3.77–5.28)
RDW: 13.9 % (ref 12.3–15.4)
WBC: 6.7 10*3/uL (ref 3.4–10.8)

## 2018-05-07 LAB — COMPREHENSIVE METABOLIC PANEL
ALT: 19 IU/L (ref 0–32)
AST: 20 IU/L (ref 0–40)
Albumin/Globulin Ratio: 1.4 (ref 1.2–2.2)
Albumin: 4.1 g/dL (ref 3.5–5.5)
Alkaline Phosphatase: 40 IU/L (ref 39–117)
BUN/Creatinine Ratio: 7 — ABNORMAL LOW (ref 9–23)
BUN: 8 mg/dL (ref 6–24)
Bilirubin Total: 0.5 mg/dL (ref 0.0–1.2)
CO2: 25 mmol/L (ref 20–29)
Calcium: 9.8 mg/dL (ref 8.7–10.2)
Chloride: 101 mmol/L (ref 96–106)
Creatinine, Ser: 1.15 mg/dL — ABNORMAL HIGH (ref 0.57–1.00)
GFR calc Af Amer: 65 mL/min/{1.73_m2} (ref >59)
GFR calc non Af Amer: 56 mL/min/{1.73_m2} — ABNORMAL LOW (ref >59)
Globulin, Total: 2.9 g/dL (ref 1.5–4.5)
Glucose: 96 mg/dL (ref 65–99)
Potassium: 5.3 mmol/L — ABNORMAL HIGH (ref 3.5–5.2)
Sodium: 140 mmol/L (ref 134–144)
Total Protein: 7 g/dL (ref 6.0–8.5)

## 2018-05-07 LAB — HEMOGLOBIN A1C
Est. average glucose Bld gHb Est-mCnc: 103 mg/dL
HEMOGLOBIN A1C: 5.2 % (ref 4.8–5.6)

## 2018-05-07 LAB — TSH: TSH: 2.21 u[IU]/mL (ref 0.450–4.500)

## 2018-05-12 ENCOUNTER — Other Ambulatory Visit: Payer: Self-pay | Admitting: Family Medicine

## 2018-05-12 DIAGNOSIS — Z1231 Encounter for screening mammogram for malignant neoplasm of breast: Secondary | ICD-10-CM

## 2018-05-20 ENCOUNTER — Encounter: Payer: Self-pay | Admitting: Family Medicine

## 2018-05-20 ENCOUNTER — Ambulatory Visit (INDEPENDENT_AMBULATORY_CARE_PROVIDER_SITE_OTHER): Payer: Managed Care, Other (non HMO) | Admitting: Family Medicine

## 2018-05-20 VITALS — BP 135/80 | HR 64 | Temp 98.4°F | Ht 67.75 in | Wt 150.2 lb

## 2018-05-20 DIAGNOSIS — R7309 Other abnormal glucose: Secondary | ICD-10-CM

## 2018-05-20 DIAGNOSIS — Z0000 Encounter for general adult medical examination without abnormal findings: Secondary | ICD-10-CM | POA: Diagnosis not present

## 2018-05-20 DIAGNOSIS — I712 Thoracic aortic aneurysm, without rupture, unspecified: Secondary | ICD-10-CM

## 2018-05-20 DIAGNOSIS — N6002 Solitary cyst of left breast: Secondary | ICD-10-CM

## 2018-05-20 DIAGNOSIS — N6001 Solitary cyst of right breast: Secondary | ICD-10-CM | POA: Diagnosis not present

## 2018-05-20 DIAGNOSIS — F341 Dysthymic disorder: Secondary | ICD-10-CM | POA: Diagnosis not present

## 2018-05-20 MED ORDER — FORFIVO XL 450 MG PO TB24: 1.0000 | ORAL_TABLET | Freq: Every day | ORAL | 3 refills | Status: DC

## 2018-05-20 NOTE — Assessment & Plan Note (Addendum)
Cardiology continues to follow along with vascular  S/p aortic root replacement  bp better on 2nd check today  BP: 135/80

## 2018-05-20 NOTE — Assessment & Plan Note (Signed)
Lab Results  Component Value Date   HGBA1C 5.2 05/06/2018   Good diet and exercise habits disc imp of low glycemic diet and wt loss to prevent DM2

## 2018-05-20 NOTE — Progress Notes (Signed)
Subjective:    Patient ID: Elizabeth Hayden, female    DOB: [birth year], 47 y.o.   MRN: [id number]  HPI Here for health maintenance exam and to review chronic medical problems    She has had 3 wasp stings this summer  Had the nest taken care of   Wt Readings from Last 3 Encounters:  05/20/18 150 lb 4 oz (68.2 kg)  04/04/18 149 lb (67.6 kg)  08/20/17 150 lb 8 oz (68.3 kg)   23.01 kg/m   Has felt fine for the most part  Taking care of herself - taking HIIT classes  Eating healthy most of the time    Flu vaccines - will get in the fall   Mammogram 8/18 -has next one scheduled for the end of the  Last one req addn views Self exam - always lumpy - can't tell the difference  She has hx of many cysts-continues to watch   Pap 8/18 = ascus with pos HPV and colp was set up with gyn  Did colp- came out ok  Goes yearly    Tetanus shot 1/16 HIV screen neg 7/17   BP Readings from Last 3 Encounters:  05/20/18 (!) 156/86  04/04/18 128/76  08/20/17 138/84  bp is up today- stress today    Pulse Readings from Last 3 Encounters:  05/20/18 64  04/04/18 74  08/20/17 69    Cholesterol  Lab Results  Component Value Date   CHOL 212 (H) 05/06/2018   CHOL 178 03/23/2017   CHOL 231 (H) 04/21/2016   Lab Results  Component Value Date   HDL 65 05/06/2018   HDL 54 03/23/2017   HDL 69 04/21/2016   Lab Results  Component Value Date   LDLCALC 128 (H) 05/06/2018   LDLCALC 107 (H) 03/23/2017   LDLCALC 144 (H) 04/21/2016   Lab Results  Component Value Date   TRIG 93 05/06/2018   TRIG 84 03/23/2017   TRIG 92 04/21/2016   Lab Results  Component Value Date   CHOLHDL 3.3 05/06/2018   CHOLHDL 3.3 03/23/2017   CHOLHDL 3.3 04/21/2016   No results found for: LDLDIRECT  HDL and LDL up  Ratio is the same  She was out of fish oil when she had that checked   H/o elevated random glucose Lab Results  Component Value Date   HGBA1C 5.2 05/06/2018    Other labs Lab Results    Component Value Date   CREATININE 1.15 (H) 05/06/2018   BUN 8 05/06/2018   NA 140 05/06/2018   K 5.3 (H) 05/06/2018   CL 101 05/06/2018   CO2 25 05/06/2018  she makes effort to drink more water One diet coke per day    Lab Results  Component Value Date   ALT 19 05/06/2018   AST 20 05/06/2018   ALKPHOS 40 05/06/2018   BILITOT 0.5 05/06/2018   Lab Results  Component Value Date   TSH 2.210 05/06/2018   Lab Results  Component Value Date   WBC 6.7 05/06/2018   HGB 14.0 05/06/2018   HCT 42.2 05/06/2018   MCV 93 05/06/2018   PLT 305 05/06/2018    Heart f/u-nothing new/doing well   Patient Active Problem List   Diagnosis Date Noted  . Elevated glucose level 03/25/2018  . Family history of ovarian cancer 06/08/2017  . Family history of breast cancer 06/08/2017  . H/O fracture of wrist 06/02/2017  . Bilateral breast cysts 04/21/2016  . Coarctation of aorta 11/24/2011  .  Aortic aneurysm (HCC) 11/24/2011  . Abnormal Pap smear and cervical HPV (human papillomavirus) 05/31/2011  . Gynecological examination 05/19/2011  . Routine general medical examination at a health care facility 05/19/2011  . CERV HIGH RISK HUMAN PAPILLOMAVIRUS DNA TEST POS 11/14/2009  . History of eating disorder 11/02/2007  . Dysthymia 11/02/2007  . CARDIOMYOPATHY, HYPERTROPHIC, OBSTRUCTIVE 11/02/2007  . Allergic rhinitis 11/02/2007  . BICUSPID AORTIC VALVE 11/02/2007   Past Medical History:  Diagnosis Date  . Aortic root dilation (HCC)   . Congenital heart disease    bicuspid aortic valve with coarctation repair 04/1974  . Depression   . Edema   . Fibrocystic breast   . GERD (gastroesophageal reflux disease)    takes Zantac daily as needed  . HPV (human papilloma virus) infection   . Seizures (HCC)    as a baby and only 1 time d/t high fever  . Shortness of breath dyspnea    with exertion  . Sinus infection    Nov 16 and treated with antibiotic  . TMJ (dislocation of temporomandibular  joint)    Wears a night gear   . UTI (lower urinary tract infection)    hx of   . Yeast infection    08/17/14   Past Surgical History:  Procedure Laterality Date  . ASCENDING AORTIC ROOT REPLACEMENT N/A 09/03/2014   Procedure: ASCENDING AORTIC ROOT REPLACEMENT & VALVE SPARING ROOT;  Surgeon: Alleen BorneBryan K Bartle, MD;  Location: [facility] OR;  Service: Open Heart Surgery;  Laterality: N/A;  [facility]IR[facility] ARREST  . COARCTATION OF AORTA EXCISION  1975  . EYE SURGERY    . EYE SURGERY Bilateral 1985  . INTRAOPERATIVE TRANSESOPHAGEAL ECHOCARDIOGRAM N/A 09/03/2014   Procedure: INTRAOPERATIVE TRANSESOPHAGEAL ECHOCARDIOGRAM;  Surgeon: Alleen BorneBryan K Bartle, MD;  [facility];  Service: Open Heart Surgery;  Laterality: N/A;  . LEFT HEART CATHETERIZATION WITH CORONARY ANGIOGRAM N/A 08/06/2014   Procedure: LEFT HEART CATHETERIZATION WITH CORONARY ANGIOGRAM;  Surgeon: Wendall StadePeter C Nishan, MD;  [facility];  Service: Cardiovascular;  Laterality: N/A;  . TEE WITHOUT CARDIOVERSION N/A 08/06/2014   Procedure: TRANSESOPHAGEAL ECHOCARDIOGRAM (TEE);  Surgeon: Wendall StadePeter C Nishan, MD;  [facility];  Service: Cardiovascular;  Laterality: N/A;   Social History   Tobacco Use  . Smoking status: Never Smoker  . Smokeless tobacco: Never Used  Substance Use Topics  . Alcohol use: Yes    Alcohol/week: 0.0 standard drinks    Comment: occasional  . Drug use: No   Family History  Problem Relation Age of Onset  . Hypertension Father   . Diabetes Father   . Cancer Father        lung   . Breast cancer Maternal Grandmother   . Leukemia Maternal Grandmother   . Ovarian cancer Paternal Grandmother   . Celiac disease Son    Allergies  Allergen Reactions  . Ace Inhibitors     REACTION: rash and cough  . Altace [Ramipril] Hives and Itching   Current Outpatient Medications on File Prior to Visit  Medication Sig Dispense Refill  . Biotin w/ Vitamins C & E (HAIR SKIN & NAILS GUMMIES PO) Take 1 tablet by mouth daily.    Marland Kitchen.  CALCIUM-VITAMIN D PO Take 2 tablets by mouth daily.      . metoprolol succinate (TOPROL-XL) 50 MG 24 hr tablet TAKE 1 TABLET BY MOUTH  DAILY WITH OR IMMEDIATLEY  FOLLOWING A MEAL 90 tablet 3  . Multiple Vitamin (MULTIVITAMIN) capsule Take 1 capsule by mouth  daily.      Marland Kitchen NORTREL 7/7/7 0.5/0.75/1-35 MG-MCG tablet TAKE 1 TABLET BY MOUTH  DAILY 84 tablet 1  . Probiotic Product (ALIGN PO) Take 1 capsule by mouth daily.    . ranitidine (ZANTAC) 75 MG tablet Take 150 mg by mouth daily.     No current facility-administered medications on file prior to visit.     Review of Systems  Constitutional: Negative for activity change, appetite change, fatigue, fever and unexpected weight change.  HENT: Negative for congestion, ear pain, rhinorrhea, sinus pressure and sore throat.   Eyes: Negative for pain, redness and visual disturbance.  Respiratory: Negative for cough, shortness of breath and wheezing.   Cardiovascular: Negative for chest pain and palpitations.  Gastrointestinal: Negative for abdominal pain, blood in stool, constipation and diarrhea.  Endocrine: Negative for polydipsia and polyuria.  Genitourinary: Negative for dysuria, frequency and urgency.  Musculoskeletal: Negative for arthralgias, back pain and myalgias.  Skin: Negative for pallor and rash.  Allergic/Immunologic: Negative for environmental allergies.  Neurological: Negative for dizziness, syncope and headaches.  Hematological: Negative for adenopathy. Does not bruise/bleed easily.  Psychiatric/Behavioral: Negative for decreased concentration and dysphoric mood. The patient is not nervous/anxious.        Stressors        Objective:   Physical Exam  Constitutional: She appears well-developed and well-nourished. No distress.  Well appearing   HENT:  Head: Normocephalic and atraumatic.  Right Ear: External ear normal.  Left Ear: External ear normal.  Mouth/Throat: Oropharynx is clear and moist.  Eyes: Pupils are equal, round,  and reactive to light. Conjunctivae and EOM are normal. Right eye exhibits no discharge. Left eye exhibits no discharge. No scleral icterus.  Neck: Normal range of motion. Neck supple. No JVD present. Carotid bruit is not present. No tracheal deviation present. No thyromegaly present.  Cardiovascular: Normal rate, regular rhythm and intact distal pulses. Exam reveals no gallop.  Murmur heard. Pulmonary/Chest: Effort normal and breath sounds normal. No stridor. No respiratory distress. She has no wheezes. She has no rales. She exhibits no tenderness. No breast tenderness, discharge or bleeding.  Abdominal: Soft. Bowel sounds are normal. She exhibits no distension, no abdominal bruit and no mass. There is no tenderness.  Genitourinary: No breast tenderness, discharge or bleeding.  Genitourinary Comments: Breast exam: No mass, nodules, thickening, tenderness, bulging, retraction, inflamation, nipple discharge or skin changes noted.  No axillary or clavicular LA.    Fibrocystic/dense with prominent ridges of breast tissue   Musculoskeletal: Normal range of motion. She exhibits no edema or tenderness.  Lymphadenopathy:    She has no cervical adenopathy.  Neurological: She is alert. She has normal reflexes. She displays normal reflexes. No cranial nerve deficit. She exhibits normal muscle tone. Coordination normal.  Skin: Skin is warm and dry. No rash noted. No erythema. No pallor.  Tanned Solar lentigines diffusely   Psychiatric: She has a normal mood and affect. Her mood appears not anxious. She does not exhibit a depressed mood.  Cheerful and talkative           Assessment & Plan:   Problem List Items Addressed This Visit      Other   Bilateral breast cysts    Pt continues regular 3D mammograms No change on exam today      Dysthymia    Continues forfivo xl (daw) daily  Very helpful  Mood is stable as are stressors       Relevant Medications   FORFIVO XL  450 MG TB24   Elevated  glucose level    Lab Results  Component Value Date   HGBA1C 5.2 05/06/2018   Good diet and exercise habits disc imp of low glycemic diet and wt loss to prevent DM2       Routine general medical examination at a health care facility - Primary    Reviewed health habits including diet and exercise and skin cancer prevention Reviewed appropriate screening tests for age  Also reviewed health mt list, fam hx and immunization status , as well as social and family history   See HPI Labs reviewed (suspect the K of 5.3 is lab error or from MVI) She will inc water and stop mvi (but continue ca plus D)  utd gyn care and breast cancer screening Will discuss colon cancer screening at age 59

## 2018-05-20 NOTE — Assessment & Plan Note (Signed)
No symptoms currently  Doing well under cardiology care

## 2018-05-20 NOTE — Assessment & Plan Note (Signed)
Reviewed health habits including diet and exercise and skin cancer prevention Reviewed appropriate screening tests for age  Also reviewed health mt list, fam hx and immunization status , as well as social and family history   See HPI Labs reviewed (suspect the K of 5.3 is lab error or from MVI) She will inc water and stop mvi (but continue ca plus D)  utd gyn care and breast cancer screening Will discuss colon cancer screening at age 47

## 2018-05-20 NOTE — Patient Instructions (Addendum)
Don't forget to get your flu shots  Drink lots of water for kidney health   You don't need a multivitamin  Just the calcium plus D   Keep exercising and taking care of yourself

## 2018-05-20 NOTE — Assessment & Plan Note (Signed)
Pt continues regular 3D mammograms No change on exam today

## 2018-05-20 NOTE — Assessment & Plan Note (Signed)
Continues forfivo xl (daw) daily  Very helpful  Mood is stable as are stressors

## 2018-06-07 ENCOUNTER — Ambulatory Visit: Payer: Managed Care, Other (non HMO) | Admitting: Family Medicine

## 2018-06-07 ENCOUNTER — Encounter: Payer: Self-pay | Admitting: Family Medicine

## 2018-06-07 ENCOUNTER — Ambulatory Visit: Payer: Self-pay | Admitting: Family Medicine

## 2018-06-07 VITALS — BP 140/76 | HR 73 | Temp 98.0°F | Ht 67.75 in | Wt 150.8 lb

## 2018-06-07 DIAGNOSIS — R350 Frequency of micturition: Secondary | ICD-10-CM | POA: Diagnosis not present

## 2018-06-07 DIAGNOSIS — N3000 Acute cystitis without hematuria: Secondary | ICD-10-CM | POA: Diagnosis not present

## 2018-06-07 LAB — POC URINALSYSI DIPSTICK (AUTOMATED)
BILIRUBIN UA: NEGATIVE
Blood, UA: 200
GLUCOSE UA: NEGATIVE
KETONES UA: NEGATIVE
Nitrite, UA: NEGATIVE
PH UA: 6 (ref 5.0–8.0)
Protein, UA: NEGATIVE
Spec Grav, UA: <=1.005 — AB (ref 1.010–1.025)
Urobilinogen, UA: 0.2 E.U./dL

## 2018-06-07 MED ORDER — SULFAMETHOXAZOLE-TRIMETHOPRIM 800-160 MG PO TABS: 1.0000 | ORAL_TABLET | Freq: Two times a day (BID) | ORAL | 0 refills | Status: DC

## 2018-06-07 NOTE — Progress Notes (Signed)
Subjective:    Patient ID: Elizabeth Hayden, female    DOB: September 12, 1971, 47 y.o.   MRN: [id number]  HPI  Here for urinary symptoms (on and off for a few weeks)  However worse today   Cloudy urine Foul smell L lower abd discomfort  Urinary frequency Drank a lot of water to see if it would help   No vomiting  Some nausea  No fever      UA pos: Results for orders placed or performed in visit on 06/07/18  POCT Urinalysis Dipstick (Automated)  Result Value Ref Range   Color, UA Yellow    Clarity, UA Cloudy    Glucose, UA Negative Negative   Bilirubin, UA Negative    Ketones, UA Negative    Spec Grav, UA <=1.005 (A) 1.010 - 1.025   Blood, UA 200 Ery/uL    pH, UA 6.0 5.0 - 8.0   Protein, UA Negative Negative   Urobilinogen, UA 0.2 0.2 or 1.0 E.U./dL   Nitrite, UA Negative    Leukocytes, UA Large (3+) (A) Negative     Last uti was a year ago  E coli-pan sensitive  Patient Active Problem List   Diagnosis Date Noted  . Acute cystitis 06/07/2018  . Elevated glucose level 03/25/2018  . Family history of ovarian cancer 06/08/2017  . Family history of breast cancer 06/08/2017  . H/O fracture of wrist 06/02/2017  . Bilateral breast cysts 04/21/2016  . Coarctation of aorta 11/24/2011  . Aortic aneurysm (HCC) 11/24/2011  . Abnormal Pap smear and cervical HPV (human papillomavirus) 05/31/2011  . Gynecological examination 05/19/2011  . Routine general medical examination at a health care facility 05/19/2011  . CERV HIGH RISK HUMAN PAPILLOMAVIRUS DNA TEST POS 11/14/2009  . History of eating disorder 11/02/2007  . Dysthymia 11/02/2007  . CARDIOMYOPATHY, HYPERTROPHIC, OBSTRUCTIVE 11/02/2007  . Allergic rhinitis 11/02/2007  . BICUSPID AORTIC VALVE 11/02/2007   Past Medical History:  Diagnosis Date  . Aortic root dilation (HCC)   . Congenital heart disease    bicuspid aortic valve with coarctation repair 04/1974  . Depression   . Edema   . Fibrocystic breast   . GERD  (gastroesophageal reflux disease)    takes Zantac daily as needed  . HPV (human papilloma virus) infection   . Seizures (HCC)    as a baby and only 1 time d/t high fever  . Shortness of breath dyspnea    with exertion  . Sinus infection    Nov 16 and treated with antibiotic  . TMJ (dislocation of temporomandibular joint)    Wears a night gear   . UTI (lower urinary tract infection)    hx of   . Yeast infection    08/17/14   Past Surgical History:  Procedure Laterality Date  . ASCENDING AORTIC ROOT REPLACEMENT N/A 09/03/2014   Procedure: ASCENDING AORTIC ROOT REPLACEMENT & VALVE SPARING ROOT;  Surgeon: Alleen Borne, MD;  Location: [facility] OR;  Service: Open Heart Surgery;  Laterality: N/A;  [facility] ARREST  . COARCTATION OF AORTA EXCISION  1975  . EYE SURGERY    . EYE SURGERY Bilateral 1985  . INTRAOPERATIVE TRANSESOPHAGEAL ECHOCARDIOGRAM N/A 09/03/2014   Procedure: INTRAOPERATIVE TRANSESOPHAGEAL ECHOCARDIOGRAM;  Surgeon: Alleen Borne, MD;  Location: [facility] OR;  Service: Open Heart Surgery;  Laterality: N/A;  . LEFT HEART CATHETERIZATION WITH CORONARY ANGIOGRAM N/A 08/06/2014   Procedure: LEFT HEART CATHETERIZATION WITH CORONARY ANGIOGRAM;  Surgeon: Wendall Stade, MD;  Location: [facility] CATH  LAB;  Service: Cardiovascular;  Laterality: N/A;  . TEE WITHOUT CARDIOVERSION N/A 08/06/2014   Procedure: TRANSESOPHAGEAL ECHOCARDIOGRAM (TEE);  Surgeon: Wendall StadePeter C Nishan, MD;  [facility];  Service: Cardiovascular;  Laterality: N/A;   Social History   Tobacco Use  . Smoking status: Never Smoker  . Smokeless tobacco: Never Used  Substance Use Topics  . Alcohol use: Yes    Alcohol/week: 0.0 standard drinks    Comment: occasional  . Drug use: No   Family History  Problem Relation Age of Onset  . Hypertension Father   . Diabetes Father   . Cancer Father        lung   . Breast cancer Maternal Grandmother   . Leukemia Maternal Grandmother   . Ovarian cancer Paternal Grandmother   . Celiac  disease Son    Allergies  Allergen Reactions  . Ace Inhibitors     REACTION: rash and cough  . Altace [Ramipril] Hives and Itching   Current Outpatient Medications on File Prior to Visit  Medication Sig Dispense Refill  . Biotin w/ Vitamins C & E (HAIR SKIN & NAILS GUMMIES PO) Take 1 tablet by mouth daily.    Marland Kitchen. CALCIUM-VITAMIN D PO Take 2 tablets by mouth daily.      . FORFIVO XL 450 MG TB24 Take 1 tablet by mouth daily. 90 tablet 3  . metoprolol succinate (TOPROL-XL) 50 MG 24 hr tablet TAKE 1 TABLET BY MOUTH  DAILY WITH OR IMMEDIATLEY  FOLLOWING A MEAL 90 tablet 3  . Multiple Vitamin (MULTIVITAMIN) capsule Take 1 capsule by mouth daily.      Marland Kitchen. NORTREL 7/7/7 0.5/0.75/1-35 MG-MCG tablet TAKE 1 TABLET BY MOUTH  DAILY 84 tablet 1  . Probiotic Product (ALIGN PO) Take 1 capsule by mouth daily.    . ranitidine (ZANTAC) 75 MG tablet Take 150 mg by mouth daily.     No current facility-administered medications on file prior to visit.      Review of Systems  Constitutional: Positive for fatigue. Negative for activity change, appetite change and fever.  HENT: Negative for congestion and sore throat.   Eyes: Negative for itching and visual disturbance.  Respiratory: Negative for cough and shortness of breath.   Cardiovascular: Negative for leg swelling.  Gastrointestinal: Negative for abdominal distention, abdominal pain, constipation, diarrhea and nausea.  Endocrine: Negative for cold intolerance and polydipsia.  Genitourinary: Positive for frequency, pelvic pain and urgency. Negative for decreased urine volume, difficulty urinating, dysuria, flank pain, hematuria, vaginal bleeding and vaginal discharge.  Musculoskeletal: Negative for myalgias.  Skin: Negative for rash.  Allergic/Immunologic: Negative for immunocompromised state.  Neurological: Negative for dizziness and weakness.  Hematological: Negative for adenopathy.       Objective:   Physical Exam  Constitutional: She appears  well-developed and well-nourished. No distress.  Well appearing   HENT:  Head: Normocephalic and atraumatic.  Eyes: Pupils are equal, round, and reactive to light. Conjunctivae and EOM are normal.  Neck: Normal range of motion. Neck supple.  Cardiovascular: Normal rate, regular rhythm and normal heart sounds.  Pulmonary/Chest: Effort normal and breath sounds normal.  Abdominal: Soft. Bowel sounds are normal. She exhibits no distension. There is tenderness. There is no rebound.  No cva tenderness  Mild suprapubic tenderness as well as L side (no rebound or guarding)  Musculoskeletal: She exhibits no edema.  Lymphadenopathy:    She has no cervical adenopathy.  Neurological: She is alert.  Skin: No rash noted.  Psychiatric: She has  a normal mood and affect.          Assessment & Plan:   Problem List Items Addressed This Visit      Genitourinary   Acute cystitis - Primary    With frequency/odor and L lower abd discomfort  Pos UA Ucx pending Enc good fluid intake tx with bactrim DS for 7d  Update if not starting to improve in several days  or if worsening   Disc ways to prevent utis  Handout given also       Other Visit Diagnoses    Urinary frequency       Relevant Orders   POCT Urinalysis Dipstick (Automated) (Completed)   Urine Culture

## 2018-06-07 NOTE — Assessment & Plan Note (Signed)
With frequency/odor and L lower abd discomfort  Pos UA Ucx pending Enc good fluid intake tx with bactrim DS for 7d  Update if not starting to improve in several days  or if worsening   Disc ways to prevent utis  Handout given also

## 2018-06-07 NOTE — Patient Instructions (Addendum)
Drink lots of water  Take the bactrim Ds as px -get it today  Over the counter analgesic is fine for pain   Update if not starting to improve in several days or if worsening  Urinary Frequency, Adult Urinary frequency means urinating more often than usual. People with urinary frequency urinate at least 8 times in 24 hours, even if they drink a normal amount of fluid. Although they urinate more often than normal, the total amount of urine produced in a day may be normal. Urinary frequency is also called pollakiuria. What are the causes? This condition may be caused by:  A urinary tract infection.  Obesity.  Bladder problems, such as bladder stones.  Caffeine or alcohol.  Eating food or drinking fluids that irritate the bladder. These include coffee, tea, soda, artificial sweeteners, citrus, tomato-based foods, and chocolate.  Certain medicines, such as medicines that help the body get rid of extra fluid (diuretics).  Muscle or nerve weakness.  Overactive bladder.  Chronic diabetes.  Interstitial cystitis.  In men, problems with the prostate, such as an enlarged prostate.  In women, pregnancy.  In some cases, the cause may not be known. What increases the risk? This condition is more likely to develop in:  Women who have gone through menopause.  Men with prostate problems.  People with a disease or injury that affects the nerves or spinal cord.  People who have or have had a condition that affects the brain, such as a stroke.  What are the signs or symptoms? Symptoms of this condition include:  Feeling an urgent need to urinate often. The stress and anxiety of needing to find a bathroom quickly can make this urge worse.  Urinating 8 or more times in 24 hours.  Urinating as often as every 1 to 2 hours.  How is this diagnosed? This condition is diagnosed based on your symptoms, your medical history, and a physical exam. You may have tests, such as:  Blood  tests.  Urine tests.  Imaging tests, such as X-rays or ultrasounds.  A bladder test.  A test of your neurological system. This is the body system that senses the need to urinate.  A test to check for problems in the urethra and bladder called cystoscopy.  You may also be asked to keep a bladder diary. A bladder diary is a record of what you eat and drink, how often you urinate, and how much you urinate. You may need to see a health care provider who specializes in conditions of the urinary tract (urologist) or kidneys (nephrologist). How is this treated? Treatment for this condition depends on the cause. Sometimes the condition goes away on its own and treatment is not necessary. If treatment is needed, it may include:  Taking medicine.  Learning exercises that strengthen the muscles that help control urination.  Following a bladder training program. This may include: ? Learning to delay going to the bathroom. ? Double urinating (voiding). This helps if you are not completely emptying your bladder. ? Scheduled voiding.  Making diet changes, such as: ? Avoiding caffeine. ? Drinking fewer fluids, especially alcohol. ? Not drinking in the evening. ? Not having foods or drinks that may irritate the bladder. ? Eating foods that help prevent or ease constipation. Constipation can make this condition worse.  Having the nerves in your bladder stimulated. There are two options for stimulating the nerves to your bladder: ? Outpatient electrical nerve stimulation. This is done by your health care provider. ?  Surgery to implant a bladder pacemaker. The pacemaker helps to control the urge to urinate.  Follow these instructions at home:  Keep a bladder diary if told to by your health care provider.  Take over-the-counter and prescription medicines only as told by your health care provider.  Do any exercises as told by your health care provider.  Follow a bladder training program as told  by your health care provider.  Make any recommended diet changes.  Keep all follow-up visits as told by your health care provider. This is important. Contact a health care provider if:  You start urinating more often.  You feel pain or irritation when you urinate.  You notice blood in your urine.  Your urine looks cloudy.  You develop a fever.  You begin vomiting. Get help right away if:  You are unable to urinate. This information is not intended to replace advice given to you by your health care provider. Make sure you discuss any questions you have with your health care provider. Document Released: 07/25/2009 Document Revised: 10/30/2015 Document Reviewed: 04/24/2015 Elsevier Interactive Patient Education  [employer].

## 2018-06-07 NOTE — Telephone Encounter (Signed)
I returned her call regarding foul smelling urine that is also cloudy.   She is also feeling like she has to "push to get the urine out and only a little comes out".   Denies burning.   Also c/o pain in front lower abd. On the left.  Has a history of UTIs.  She's had these symptoms for several weeks.   "I've been trying to flush myself out hoping that would resolve it".  She was scheduled with Dr. Milinda Antisower by Lyla Sonarrie the flow coordinator for today at 3:00.     Reason for Disposition . Bad or foul-smelling urine  Answer Assessment - Initial Assessment Questions 1. SYMPTOM: "What's the main symptom you're concerned about?" (e.g., frequency, incontinence)     Foul smelling cloudy urine.    I'm having trouble getting urine out.    I've drank 2 bottles of water this morning.    I've had several BMs this morning. 2. ONSET: "When did the  cloudy  start?"     A few weeks this has been going on.   I've been flushing but it has not helped.   No fever.    3. PAIN: "Is there any pain?" If so, ask: "How bad is it?" (Scale: 1-10; mild, moderate, severe)     Pain on left side in front area near my hip.   4. CAUSE: "What do you think is causing the symptoms?"     Maybe constipated but I've had multiple BMs this morning.   I've had UTIs before.   I feel I need to push to get the urine out.    Maybe a kidney stone. 5. OTHER SYMPTOMS: "Do you have any other symptoms?" (e.g., fever, flank pain, blood in urine, pain with urination)     No blood, smells bad, no burning.     6. PREGNANCY: "Is there any chance you are pregnant?" "When was your last menstrual period?"     No!  Protocols used: [facility]

## 2018-06-10 ENCOUNTER — Ambulatory Visit
Admission: RE | Admit: 2018-06-10 | Discharge: 2018-06-10 | Disposition: A | Discharge status: Home / Self Care | Payer: Managed Care, Other (non HMO) | Source: Ambulatory Visit | Attending: Family Medicine | Admitting: Family Medicine

## 2018-06-10 ENCOUNTER — Other Ambulatory Visit: Payer: Self-pay | Admitting: Obstetrics and Gynecology

## 2018-06-10 DIAGNOSIS — Z1231 Encounter for screening mammogram for malignant neoplasm of breast: Secondary | ICD-10-CM

## 2018-06-10 DIAGNOSIS — Z3041 Encounter for surveillance of contraceptive pills: Secondary | ICD-10-CM

## 2018-06-10 LAB — URINE CULTURE

## 2018-06-14 ENCOUNTER — Other Ambulatory Visit: Payer: Self-pay | Admitting: Obstetrics and Gynecology

## 2018-06-14 ENCOUNTER — Other Ambulatory Visit: Payer: Self-pay | Admitting: Cardiovascular Disease

## 2018-06-14 DIAGNOSIS — Z3041 Encounter for surveillance of contraceptive pills: Secondary | ICD-10-CM

## 2018-07-06 ENCOUNTER — Other Ambulatory Visit: Payer: Self-pay | Admitting: Obstetrics and Gynecology

## 2018-07-06 ENCOUNTER — Ambulatory Visit (INDEPENDENT_AMBULATORY_CARE_PROVIDER_SITE_OTHER): Payer: Managed Care, Other (non HMO) | Admitting: Obstetrics and Gynecology

## 2018-07-06 ENCOUNTER — Encounter: Payer: Self-pay | Admitting: Obstetrics and Gynecology

## 2018-07-06 VITALS — BP 136/82 | HR 66 | Ht 68.0 in | Wt 154.0 lb

## 2018-07-06 DIAGNOSIS — Z3041 Encounter for surveillance of contraceptive pills: Secondary | ICD-10-CM | POA: Diagnosis not present

## 2018-07-06 DIAGNOSIS — Z803 Family history of malignant neoplasm of breast: Secondary | ICD-10-CM

## 2018-07-06 DIAGNOSIS — N6321 Unspecified lump in the left breast, upper outer quadrant: Secondary | ICD-10-CM | POA: Diagnosis not present

## 2018-07-06 DIAGNOSIS — Z1151 Encounter for screening for human papillomavirus (HPV): Secondary | ICD-10-CM | POA: Diagnosis not present

## 2018-07-06 DIAGNOSIS — Z8041 Family history of malignant neoplasm of ovary: Secondary | ICD-10-CM

## 2018-07-06 DIAGNOSIS — N6320 Unspecified lump in the left breast, unspecified quadrant: Secondary | ICD-10-CM

## 2018-07-06 DIAGNOSIS — Z124 Encounter for screening for malignant neoplasm of cervix: Secondary | ICD-10-CM

## 2018-07-06 DIAGNOSIS — R87810 Cervical high risk human papillomavirus (HPV) DNA test positive: Secondary | ICD-10-CM

## 2018-07-06 DIAGNOSIS — Z01411 Encounter for gynecological examination (general) (routine) with abnormal findings: Secondary | ICD-10-CM

## 2018-07-06 DIAGNOSIS — Z1231 Encounter for screening mammogram for malignant neoplasm of breast: Secondary | ICD-10-CM

## 2018-07-06 DIAGNOSIS — Z1239 Encounter for other screening for malignant neoplasm of breast: Secondary | ICD-10-CM

## 2018-07-06 DIAGNOSIS — Z01419 Encounter for gynecological examination (general) (routine) without abnormal findings: Secondary | ICD-10-CM

## 2018-07-06 MED ORDER — NORETHINDRONE 0.35 MG PO TABS: 1.0000 | ORAL_TABLET | Freq: Every day | ORAL | 3 refills | Status: DC

## 2018-07-06 NOTE — Progress Notes (Addendum)
PCP:  Abner Greenspan, MD   Chief Complaint  Patient presents with  . Gynecologic Exam     HPI:      [name] is a 47 y.o. [id number] who LMP was Patient's last menstrual period was 06/13/2018 (exact date)., presents today for her annual examination.  Her menses are regular every 28-30 days on OCPs, lasting 3-4 days, light flow, although some months are heavier. OCPs have improved cycle and flow.  Dysmenorrhea mild, occurring first 1-2 days of flow. She does not have intermenstrual bleeding. Has occas vasomotor sx.  Sex activity: single partner, contraception - OCP (estrogen/progesterone).  Last Pap: 06/08/17  Results were:ASCUS /POS HPV DNA. Colpo with neg bx 07/08/17. Previous colpo 04/26/15 with neg bx after ASCUS/POS HPV DNA pap 4/16. Repeat pap due today. Hx of STDs: HPV  Last mammogram: 06/10/18 Results were: normal--routine follow-up in 12 months. Hx of LT breast cyst and fibrocystic breasts.  There is a FH of breast cancer in her MGM. There is a FH of ovarian cancer in her PGM. Pt tested BRCA neg 2014 through [place] 2014=17%. Update testing discussed last yr but pt only wants through [facility]. Pt interested in testing. The patient does not do self-breast exams.  Tobacco use: The patient denies current or previous tobacco use. Alcohol use: social drinker No drug use.  Exercise: moderately active  She does get adequate calcium and Vitamin D in her diet.  Labs with PCP. On metoprolol for HTN issueas after aortic valve repair surgery 2015. I didn't realize pt had HTN till this visit.    Past Medical History:  Diagnosis Date  . Aortic root dilation (HCC)   . Congenital heart disease    bicuspid aortic valve with coarctation repair 04/1974  . Depression   . Edema   . Fibrocystic breast   . GERD (gastroesophageal reflux disease)    takes Zantac daily as needed  . HPV (human papilloma virus) infection   . Seizures ([place])    as a baby and only 1 time d/t high fever    . Shortness of breath dyspnea    with exertion  . Sinus infection    Nov 16 and treated with antibiotic  . TMJ (dislocation of temporomandibular joint)    Wears a night gear   . UTI (lower urinary tract infection)    hx of   . Yeast infection    08/17/14    Past Surgical History:  Procedure Laterality Date  . ASCENDING AORTIC ROOT REPLACEMENT N/A 09/03/2014   Procedure: ASCENDING AORTIC ROOT REPLACEMENT & VALVE SPARING ROOT;  Surgeon: Gaye Pollack, MD;  Location: [facility];  Service: Open Heart Surgery;  Laterality: N/A;  [facility] ARREST  . COARCTATION OF AORTA EXCISION  1975  . COLPOSCOPY  04/26/2015  . CRYOTHERAPY  2009  . EYE SURGERY    . EYE SURGERY Bilateral 1985  . INTRAOPERATIVE TRANSESOPHAGEAL ECHOCARDIOGRAM N/A 09/03/2014   Procedure: INTRAOPERATIVE TRANSESOPHAGEAL ECHOCARDIOGRAM;  Surgeon: Gaye Pollack, MD;  Location: [facility] OR;  Service: Open Heart Surgery;  Laterality: N/A;  . LEFT HEART CATHETERIZATION WITH CORONARY ANGIOGRAM N/A 08/06/2014   Procedure: LEFT HEART CATHETERIZATION WITH CORONARY ANGIOGRAM;  Surgeon: Josue Hector, MD;  Location: [facility] CATH LAB;  Service: Cardiovascular;  Laterality: N/A;  . TEE WITHOUT CARDIOVERSION N/A 08/06/2014   Procedure: TRANSESOPHAGEAL ECHOCARDIOGRAM (TEE);  Surgeon: Josue Hector, MD;  Location: [place];  Service: Cardiovascular;  Laterality: N/A;    Family History  Problem Relation Age of Onset  . Hypertension Father   . Diabetes Father        DM type 2  . Cancer Father        lung   . Breast cancer Maternal Grandmother        Malignant  . Leukemia Maternal Grandmother   . Ovarian cancer Paternal Grandmother        28s, malignant  . Celiac disease Son   . Down syndrome Son     Social History   Socioeconomic History  . Marital status: Divorced    Spouse name: Not on file  . Number of children: 2  . Years of education: Not on file  . Highest education level: Not on file  Occupational History  . Not on file  Social  Needs  . Financial resource strain: Not on file  . Food insecurity:    Worry: Not on file    Inability: Not on file  . Transportation needs:    Medical: Not on file    Non-medical: Not on file  Tobacco Use  . Smoking status: Never Smoker  . Smokeless tobacco: Never Used  Substance and Sexual Activity  . Alcohol use: Yes    Alcohol/week: 0.0 standard drinks    Comment: occasional  . Drug use: No  . Sexual activity: Yes    Birth control/protection: Pill  Lifestyle  . Physical activity:    Days per week: Not on file    Minutes per session: Not on file  . Stress: Not on file  Relationships  . Social connections:    Talks on phone: Not on file    Gets together: Not on file    Attends religious service: Not on file    Active member of club or organization: Not on file    Attends meetings of clubs or organizations: Not on file    Relationship status: Not on file  . Intimate partner violence:    Fear of current or ex partner: Not on file    Emotionally abused: Not on file    Physically abused: Not on file    Forced sexual activity: Not on file  Other Topics Concern  . Not on file  Social History Narrative  . Not on file    Current Meds  Medication Sig  . Biotin w/ Vitamins C & E (HAIR SKIN & NAILS GUMMIES PO) Take 1 tablet by mouth daily.  Marland Kitchen CALCIUM-VITAMIN D PO Take 2 tablets by mouth daily.    . FORFIVO XL 450 MG TB24 Take 1 tablet by mouth daily.  . metoprolol succinate (TOPROL-XL) 50 MG 24 hr tablet Take 1 tablet (50 mg total) by mouth daily. With or immediately following a meal. Please make appt with Dr. Johnsie Cancel for November. 1st attempt  . Multiple Vitamin (MULTIVITAMIN) capsule Take 1 capsule by mouth daily.    . Probiotic Product (ALIGN PO) Take 1 capsule by mouth daily.  . ranitidine (ZANTAC) 75 MG tablet Take 150 mg by mouth daily.  . [DISCONTINUED] NORTREL 7/7/7 0.5/0.75/1-35 MG-MCG tablet TAKE 1 TABLET BY MOUTH  DAILY     ROS:  Review of Systems    Constitutional: Negative for fatigue, fever and unexpected weight change.  Respiratory: Negative for cough, shortness of breath and wheezing.   Cardiovascular: Negative for chest pain, palpitations and leg swelling.  Gastrointestinal: Negative for blood in stool, constipation, diarrhea, nausea and vomiting.  Endocrine: Negative for cold intolerance, heat intolerance and polyuria.  Genitourinary:  Negative for dyspareunia, dysuria, flank pain, frequency, genital sores, hematuria, menstrual problem, pelvic pain, urgency, vaginal bleeding, vaginal discharge and vaginal pain.  Musculoskeletal: Negative for back pain, joint swelling and myalgias.  Skin: Negative for rash.  Neurological: Negative for dizziness, syncope, light-headedness, numbness and headaches.  Hematological: Negative for adenopathy.  Psychiatric/Behavioral: Negative for agitation, confusion, sleep disturbance and suicidal ideas. The patient is not nervous/anxious.      Objective: BP 136/82   Pulse 66   Ht 5' 8" (1.727 m)   Wt 154 lb (69.9 kg)   LMP 06/13/2018 (Exact Date)   BMI 23.42 kg/m    Physical Exam  Constitutional: She is oriented to person, place, and time. She appears well-developed and well-nourished.  Genitourinary: Vagina normal and uterus normal. There is no rash or tenderness on the right labia. There is no rash or tenderness on the left labia. No erythema or tenderness in the vagina. No vaginal discharge found. Right adnexum does not display mass and does not display tenderness. Left adnexum does not display mass and does not display tenderness. Cervix does not exhibit motion tenderness or polyp. Uterus is not enlarged or tender.  Neck: Normal range of motion. No thyromegaly present.  Cardiovascular: Normal rate, regular rhythm and normal heart sounds.  No murmur heard. Pulmonary/Chest: Effort normal and breath sounds normal. Right breast exhibits no mass, no nipple discharge, no skin change and no  tenderness. Left breast exhibits no mass, no nipple discharge, no skin change and no tenderness.    Abdominal: Soft. There is no tenderness. There is no guarding.  Musculoskeletal: Normal range of motion.  Neurological: She is alert and oriented to person, place, and time. No cranial nerve deficit.  Psychiatric: She has a normal mood and affect. Her behavior is normal.  Vitals reviewed.   Assessment/Plan: Encounter for annual routine gynecological examination  Cervical cancer screening - Plan: IGP, Aptima HPV  Screening for HPV (human papillomavirus) - Plan: IGP, Aptima HPV  Cervical high risk human papillomavirus (HPV) DNA test positive - Will call pt with results. - Plan: IGP, Aptima HPV  Encounter for surveillance of contraceptive pills - BC options discussed since pt has HTN. Change to POPs for now. Discussed other prog options. IUD handout given. F/u prn. - Plan: norethindrone (MICRONOR,CAMILA,ERRIN) 0.35 MG tablet  Screening for breast cancer - Pt current on mammo  Left breast mass - 3:00 position. Not noted on 06/10/18 mammo. Hx of breast cysts in the past that come and go. Pt wants to watch sx for 1-2 wks. If not, will chk u/s  Family history of breast cancer - Plan: VistaSeq Hered Cancer w/o BRCA  Family history of ovarian cancer - Vistaseq update testing done. Will call with results. - Plan: VistaSeq Hered Cancer w/o BRCA  Meds ordered this encounter  Medications  . norethindrone (MICRONOR,CAMILA,ERRIN) 0.35 MG tablet    Sig: Take 1 tablet (0.35 mg total) by mouth daily.    Dispense:  84 tablet    Refill:  3    Order Specific Question:   Supervising Provider    Answer:   Gae Dry [[id number]]             GYN counsel breast self exam, mammography screening, adequate intake of calcium and vitamin D     F/U  Return in about 1 year (around 07/07/2019).  Alicia B. Copland, PA-C 07/07/2018 10:52 AM

## 2018-07-06 NOTE — Patient Instructions (Signed)
I value your feedback and entrusting us with your care. If you get a [facility] patient survey, I would appreciate you taking the time to let us know about your experience today. Thank you! 

## 2018-07-07 ENCOUNTER — Telehealth: Payer: Self-pay

## 2018-07-07 DIAGNOSIS — N6320 Unspecified lump in the left breast, unspecified quadrant: Secondary | ICD-10-CM | POA: Insufficient documentation

## 2018-07-07 NOTE — Telephone Encounter (Signed)
Elizabeth Hayden, genetics counselor c [facility], calling to confirm [facility] meant to order vistaSeq Hereditary Cancer WITHOUT BRCA.  Just calling to confirm.  Elizabeth Hayden can be reached at [phone]

## 2018-07-07 NOTE — Telephone Encounter (Signed)
Elizabeth Hayden aware pt tested neg for BRCA in 2014 and note in chart says  Vistasqe Hered Cancer w/o BRCA.

## 2018-07-08 NOTE — Telephone Encounter (Signed)
RN answered question

## 2018-07-11 ENCOUNTER — Other Ambulatory Visit: Payer: Self-pay | Admitting: Obstetrics and Gynecology

## 2018-07-11 DIAGNOSIS — Z3041 Encounter for surveillance of contraceptive pills: Secondary | ICD-10-CM

## 2018-07-11 MED ORDER — NORETHINDRONE 0.35 MG PO TABS: 1.0000 | ORAL_TABLET | Freq: Every day | ORAL | 3 refills | Status: DC

## 2018-07-11 NOTE — Progress Notes (Signed)
Rx to [facility] not [facility].

## 2018-07-12 LAB — IGP, APTIMA HPV
HPV Aptima: NEGATIVE
PAP SMEAR COMMENT: 0

## 2018-07-26 ENCOUNTER — Other Ambulatory Visit: Payer: Self-pay | Admitting: Obstetrics and Gynecology

## 2018-07-26 ENCOUNTER — Telehealth: Payer: Self-pay | Admitting: Obstetrics and Gynecology

## 2018-07-26 DIAGNOSIS — N6320 Unspecified lump in the left breast, unspecified quadrant: Secondary | ICD-10-CM

## 2018-07-26 LAB — VISTASEQ HERED CANCER W/O BRCA: Result Summary: NEGATIVE

## 2018-07-26 NOTE — Telephone Encounter (Signed)
Pt aware of neg Vistaseq update testing results. Aware these results don't mean she will never get these cancers.   Pt still has LT breast mass 3:00 pos. Hasn't changed since her 9/19 exam. Had neg mammo 8/19 at [facility]. Will check breast u/s since not mentioned in mammo report.

## 2018-07-27 ENCOUNTER — Encounter: Payer: Self-pay | Admitting: Obstetrics and Gynecology

## 2018-07-29 ENCOUNTER — Other Ambulatory Visit: Payer: Managed Care, Other (non HMO)

## 2018-08-28 NOTE — Progress Notes (Signed)
Patient ID: Elizabeth Hayden, female   DOB: 09/25/1971, [age] y.o.   MRN: [id number]     47 y.o. history of bicuspid AV and coarctation repair. Also tends to have generalized volume overload and chronic LE edema.  She is divorced and hs a son with Down's Syndrome. Daughter works at [employer]  She had a valve sparing aortic root replacement With Dr Laneta SimmersBartle 09/03/14 No CAD at cath prior to surgery  She had reimplantation of native cors and 28 mm Gelweave graft She needs An updated TTE  Echo  10/31/14  Reviewed and normal EF no significant AR/AS and no coarctation gradient   Study Conclusions  - Left ventricle: The cavity size was normal. Wall thickness was normal. Systolic function was normal. The estimated ejection fraction was in the range of 55% to 60%. - Aortic valve: Not well seen but known to be bicuspid with valve sparing surgery no signficiant AS/AR. Valve area (VTI): 1.73 cm^2. Valve area (Vmax): 1.66 cm^2. Valve area (Vmean): 1.63 cm^2. - Aorta: Normal appearance of root replacement with no residual aneurysm. - Mitral valve: Valve area by pressure half-time: 2.47 cm^2. Valve area by continuity equation (using LVOT flow): 1.72 cm^2. - Atrial septum: No defect or patent foramen ovale was identified. - Impressions: Previous coarctation repair. No velocities recorded over 9758m/sec on suprasternal notch images of the descending thoracic aorta  Impressions:  - Previous coarctation repair. No velocities recorded over 3358m/sec on suprasternal notch images of the descending thoracic aorta  Doing High Intensity Intervals with son. He has Down's and has lost 20 lbs He has history of AV canal defect and would like to f/u with me now that He is an "adult"  ROS: Denies fever, malais, weight loss, blurry vision, decreased visual acuity, cough, sputum, SOB, hemoptysis, pleuritic pain, palpitaitons, heartburn, abdominal pain, melena, lower extremity edema, claudication, or rash.   All other systems reviewed and negative  General: BP (!) 146/82   Pulse 72   Ht 5\' 8"  (1.727 m)   Wt 153 lb (69.4 kg)   SpO2 98%   BMI 23.26 kg/m  Affect appropriate Healthy:  appears stated age HEENT: normal Neck supple with no adenopathy JVP normal no bruits no thyromegaly Lungs clear with no wheezing and good diaphragmatic motion Heart:  S1/S2 SEM murmur, no rub, gallop or click PMI normal Abdomen: benighn, BS positve, no tenderness, no AAA no bruit.  No HSM or HJR Distal pulses intact with no bruits No edema Neuro non-focal Skin warm and dry No muscular weakness    Current Outpatient Medications  Medication Sig Dispense Refill  . Biotin w/ Vitamins C & E (HAIR SKIN & NAILS GUMMIES PO) Take 1 tablet by mouth daily.    Marland Kitchen. CALCIUM-VITAMIN D PO Take 2 tablets by mouth daily.      . FORFIVO XL 450 MG TB24 Take 1 tablet by mouth daily. 90 tablet 3  . metoprolol succinate (TOPROL-XL) 50 MG 24 hr tablet Take 1 tablet (50 mg total) by mouth daily. With or immediately following a meal. Please make appt with Dr. Eden EmmsNishan for November. 1st attempt 90 tablet 0  . norethindrone (MICRONOR,CAMILA,ERRIN) 0.35 MG tablet Take 1 tablet (0.35 mg total) by mouth daily. 84 tablet 3  . Probiotic Product (ALIGN PO) Take 1 capsule by mouth daily.    . ranitidine (ZANTAC) 75 MG tablet Take 150 mg by mouth daily.     No current facility-administered medications for this visit.     Allergies  Ace inhibitors and Altace [ramipril]  Electrocardiogram:   09/01/18  SR rate 65  LAD nonspecific ST changes   Assessment and Plan Aortic Aneurysm:  Post root replacement November 2015  On beta blocker   Bicuspid AV:  Valve sparing surgery no significant AR/AS  SBE given gortex root updated TTE ordered   Abberant Left Sublavian:  No evidence of impingement normal UE pulses no dysphagia  Edema: low sodium diet generalized consider PRN diuretic  GERD:  On zantac low carb diet   F/U in a year if TTE ok    Elizabeth Hayden

## 2018-09-01 ENCOUNTER — Ambulatory Visit: Payer: Managed Care, Other (non HMO) | Admitting: Cardiovascular Disease

## 2018-09-01 ENCOUNTER — Encounter: Payer: Self-pay | Admitting: Cardiovascular Disease

## 2018-09-01 VITALS — BP 146/82 | HR 72 | Ht 68.0 in | Wt 153.0 lb

## 2018-09-01 DIAGNOSIS — I359 Nonrheumatic aortic valve disorder, unspecified: Secondary | ICD-10-CM | POA: Diagnosis not present

## 2018-09-01 NOTE — Patient Instructions (Signed)
Medication Instructions:   If you need a refill on your cardiac medications before your next appointment, please call your pharmacy.   Lab work:  If you have labs (blood work) drawn today and your tests are completely normal, you will receive your results only by: . MyChart Message (if you have MyChart) OR . A paper copy in the mail If you have any lab test that is abnormal or we need to change your treatment, we will call you to review the results.  Testing/Procedures: Your physician has requested that you have an echocardiogram. Echocardiography is a painless test that uses sound waves to create images of your heart. It provides your doctor with information about the size and shape of your heart and how well your heart's chambers and valves are working. This procedure takes approximately one hour. There are no restrictions for this procedure.  Follow-Up: At [facility], you and your health needs are our priority.  As part of our continuing mission to provide you with exceptional heart care, we have created designated Provider Care Teams.  These Care Teams include your primary Cardiologist (physician) and Advanced Practice Providers (APPs -  Physician Assistants and Nurse Practitioners) who all work together to provide you with the care you need, when you need it. You will need a follow up appointment in 1 years.  Please call our office 2 months in advance to schedule this appointment.  You may see Peter Nishan, MD or one of the following Advanced Practice Providers on your designated Care Team:   Lori Gerhardt, NP Laura Ingold, NP . Jill McDaniel, NP   

## 2018-09-20 ENCOUNTER — Ambulatory Visit (HOSPITAL_COMMUNITY): Payer: Managed Care, Other (non HMO) | Attending: Internal Medicine

## 2018-09-20 ENCOUNTER — Other Ambulatory Visit: Payer: Self-pay

## 2018-09-20 DIAGNOSIS — I359 Nonrheumatic aortic valve disorder, unspecified: Secondary | ICD-10-CM | POA: Insufficient documentation

## 2018-10-12 DIAGNOSIS — K635 Polyp of colon: Secondary | ICD-10-CM

## 2018-10-12 HISTORY — DX: Polyp of colon: K63.5

## 2018-12-21 ENCOUNTER — Other Ambulatory Visit: Payer: Self-pay | Admitting: Cardiovascular Disease

## 2019-02-20 ENCOUNTER — Other Ambulatory Visit: Payer: Self-pay

## 2019-02-20 ENCOUNTER — Encounter: Payer: Self-pay | Admitting: Family Medicine

## 2019-02-20 ENCOUNTER — Other Ambulatory Visit: Payer: Managed Care, Other (non HMO)

## 2019-02-20 ENCOUNTER — Ambulatory Visit (INDEPENDENT_AMBULATORY_CARE_PROVIDER_SITE_OTHER): Payer: Managed Care, Other (non HMO) | Admitting: Family Medicine

## 2019-02-20 VITALS — BP 147/77 | HR 77 | Ht 67.75 in | Wt 153.0 lb

## 2019-02-20 DIAGNOSIS — R7309 Other abnormal glucose: Secondary | ICD-10-CM

## 2019-02-20 DIAGNOSIS — R300 Dysuria: Secondary | ICD-10-CM | POA: Insufficient documentation

## 2019-02-20 DIAGNOSIS — Z0000 Encounter for general adult medical examination without abnormal findings: Secondary | ICD-10-CM

## 2019-02-20 DIAGNOSIS — N3090 Cystitis, unspecified without hematuria: Secondary | ICD-10-CM | POA: Diagnosis not present

## 2019-02-20 DIAGNOSIS — R8290 Unspecified abnormal findings in urine: Secondary | ICD-10-CM

## 2019-02-20 LAB — POCT URINALYSIS DIPSTICK
Bilirubin, UA: NEGATIVE
Blood, UA: NEGATIVE
Glucose, UA: NEGATIVE
Ketones, UA: NEGATIVE
Nitrite, UA: NEGATIVE
Protein, UA: NEGATIVE
Spec Grav, UA: 1.01 (ref 1.010–1.025)
Urobilinogen, UA: 0.2 E.U./dL
pH, UA: 6 (ref 5.0–8.0)

## 2019-02-20 MED ORDER — SULFAMETHOXAZOLE-TRIMETHOPRIM 800-160 MG PO TABS: 1.0000 | ORAL_TABLET | Freq: Two times a day (BID) | ORAL | 0 refills | Status: AC

## 2019-02-20 NOTE — Progress Notes (Signed)
Virtual Visit via Video Note  I connected with Elizabeth Hayden on 02/20/19 at  3:30 PM EDT by a video enabled telemedicine application and verified that I am speaking with the correct person using two identifiers.  Location: Patient: In her home Provider: [facility] [place]   I discussed the limitations of evaluation and management by telemedicine and the availability of in person appointments. The patient expressed understanding and agreed to proceed.  History of Present Illness: This is a 48 yo female who requests virtual visit to discuss urinary symptoms.  She is a [employer] employee and has been working from home.  She provided a urine sample to the office prior to the visit. She has had several weeks of malodorous urine.  She had UTI 05/31/2018 and reports that over was her only symptom at that time.  She is noticed that she gets occasional UTI following her menstrual cycle.  She is not sure if it is due to wearing pads.  She denies any dysuria, hematuria, fever, nausea, vomiting, back pain, or incontinence. Prior urine culture showed E. coli and was successfully treated with sulfamethoxazole trimethoprim.  Past Medical History:  Diagnosis Date  . Aortic root dilation (HCC)   . Congenital heart disease    bicuspid aortic valve with coarctation repair 04/1974  . Depression   . Edema   . Fibrocystic breast   . GERD (gastroesophageal reflux disease)    takes Zantac daily as needed  . HPV (human papilloma virus) infection   . Seizures (HCC)    as a baby and only 1 time d/t high fever  . Shortness of breath dyspnea    with exertion  . Sinus infection    Nov 16 and treated with antibiotic  . TMJ (dislocation of temporomandibular joint)    Wears a night gear   . UTI (lower urinary tract infection)    hx of   . Yeast infection    08/17/14   Past Surgical History:  Procedure Laterality Date  . ASCENDING AORTIC ROOT REPLACEMENT N/A 09/03/2014   Procedure: ASCENDING AORTIC ROOT  REPLACEMENT & VALVE SPARING ROOT;  Surgeon: Alleen BorneBryan K Bartle, MD;  Location: [facility] OR;  Service: Open Heart Surgery;  Laterality: N/A;  CIRC ARREST  . COARCTATION OF AORTA EXCISION  1975  . COLPOSCOPY  04/26/2015  . CRYOTHERAPY  2009  . EYE SURGERY    . EYE SURGERY Bilateral 1985  . INTRAOPERATIVE TRANSESOPHAGEAL ECHOCARDIOGRAM N/A 09/03/2014   Procedure: INTRAOPERATIVE TRANSESOPHAGEAL ECHOCARDIOGRAM;  Surgeon: Alleen BorneBryan K Bartle, MD;  [facility];  Service: Open Heart Surgery;  Laterality: N/A;  . LEFT HEART CATHETERIZATION WITH CORONARY ANGIOGRAM N/A 08/06/2014   Procedure: LEFT HEART CATHETERIZATION WITH CORONARY ANGIOGRAM;  Surgeon: Wendall StadePeter C Nishan, MD;  [facility];  Service: Cardiovascular;  Laterality: N/A;  . TEE WITHOUT CARDIOVERSION N/A 08/06/2014   Procedure: TRANSESOPHAGEAL ECHOCARDIOGRAM (TEE);  Surgeon: Wendall StadePeter C Nishan, MD;  [facility];  Service: Cardiovascular;  Laterality: N/A;   Family History  Problem Relation Age of Onset  . Hypertension Father   . Diabetes Father        DM type 2  . Cancer Father        lung   . Breast cancer Maternal Grandmother        Malignant  . Leukemia Maternal Grandmother   . Ovarian cancer Paternal Grandmother        5560s, malignant  . Celiac disease Son   . Down syndrome Son  Social History   Tobacco Use  . Smoking status: Never Smoker  . Smokeless tobacco: Never Used  Substance Use Topics  . Alcohol use: Yes    Alcohol/week: 0.0 standard drinks    Comment: occasional  . Drug use: No      Observations/Objective: The patient was alert and answers questions appropriately.  Visible skin is unremarkable.  Patient was normally conversive and was not short of breath.  Her mood and affect were appropriate.  BP (!) 147/77 Comment: per patient  Pulse 77 Comment: per patient  Ht 5' 7.75" (1.721 m)   Wt 153 lb (69.4 kg) Comment: per patient  LMP 02/10/2019   BMI 23.44 kg/m  Wt Readings from Last 3 Encounters:   02/20/19 153 lb (69.4 kg)  09/01/18 153 lb (69.4 kg)  07/06/18 154 lb (69.9 kg)   Results for orders placed or performed in visit on 02/20/19  POCT urinalysis dipstick  Result Value Ref Range   Color, UA light yellow    Clarity, UA slightly cloudy    Glucose, UA Negative Negative   Bilirubin, UA negative    Ketones, UA negative    Spec Grav, UA 1.010 1.010 - 1.025   Blood, UA negative    pH, UA 6.0 5.0 - 8.0   Protein, UA Negative Negative   Urobilinogen, UA 0.2 0.2 or 1.0 E.U./dL   Nitrite, UA negative    Leukocytes, UA Moderate (2+) (A) Negative   Appearance     Odor      Assessment and Plan: 1. Malodorous urine - POCT urinalysis dipstick  2. Cystitis - Provided written and verbal information regarding diagnosis and treatment including RTC/ER/UC precautions.  - Urine Culture - sulfamethoxazole-trimethoprim (BACTRIM DS) 800-160 MG tablet; Take 1 tablet by mouth 2 (two) times daily for 14 doses.  Dispense: 14 tablet; Refill: 0   Olean Ree, FNP-BC  [facility], [facility]  02/20/2019 3:49 PM   Follow Up Instructions: Visit recap instructions sent to patient via my chart   I discussed the assessment and treatment plan with the patient. The patient was provided an opportunity to ask questions and all were answered. The patient agreed with the plan and demonstrated an understanding of the instructions.   The patient was advised to call back or seek an in-person evaluation if the symptoms worsen or if the condition fails to improve as anticipated.   Emi Belfast, FNP

## 2019-02-22 LAB — URINE CULTURE

## 2019-02-23 IMAGING — MG DIGITAL SCREENING BILATERAL MAMMOGRAM WITH TOMO AND CAD
8 series · 9 of 24 positions shown · non-contrast
Comparison: Previous exam(s).

CLINICAL DATA: Screening.

EXAM:
DIGITAL SCREENING BILATERAL MAMMOGRAM WITH TOMO AND CAD

[R CC synth-2D]
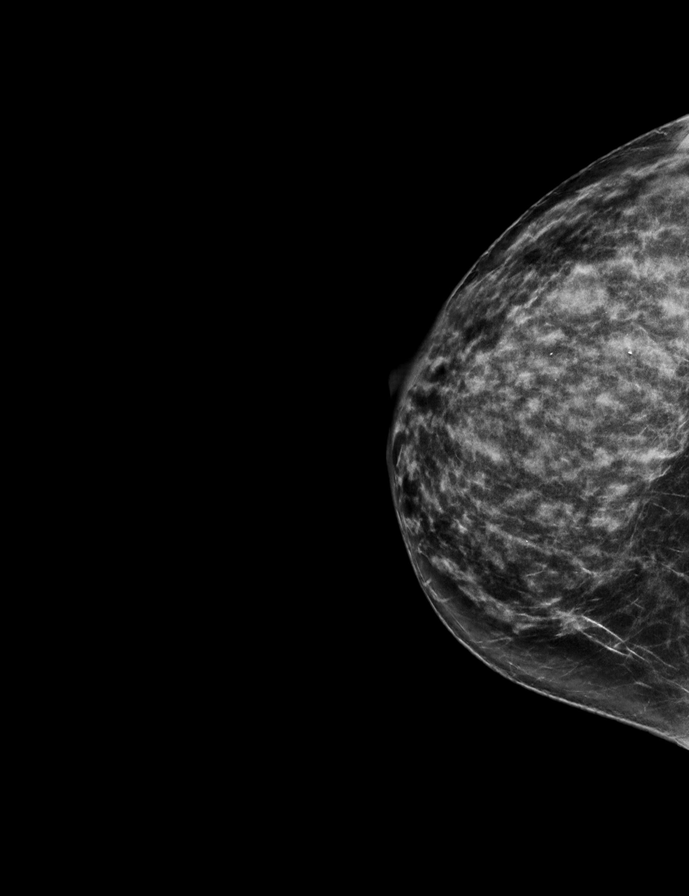

[L MLO synth-2D]
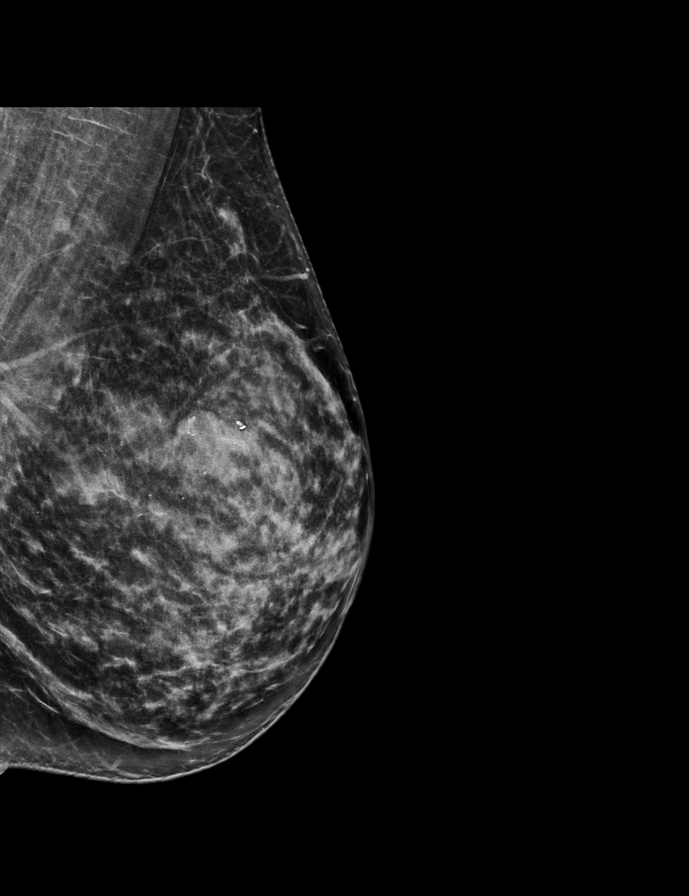

[L CC synth-2D]
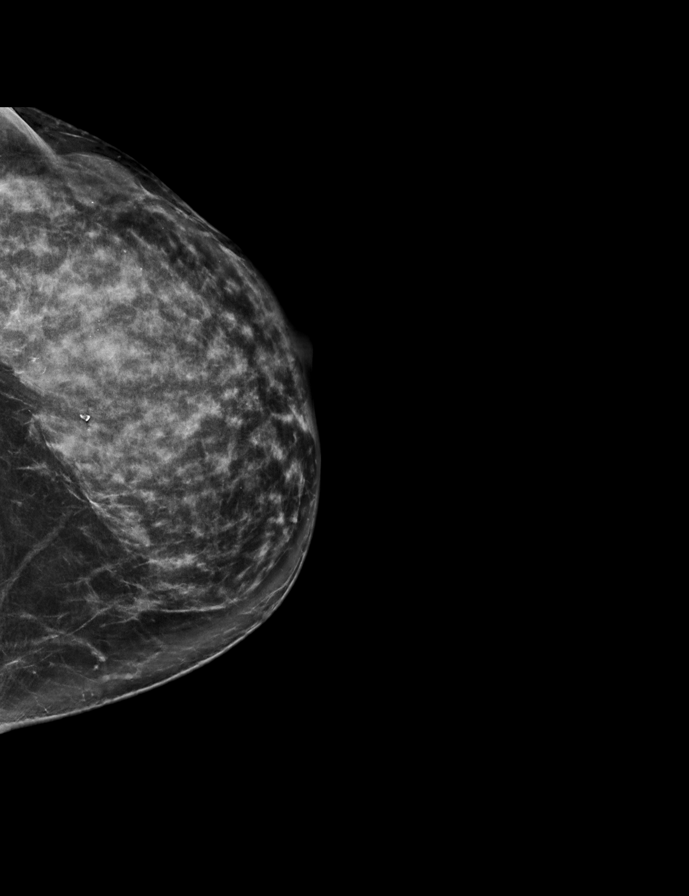

[R MLO synth-2D]
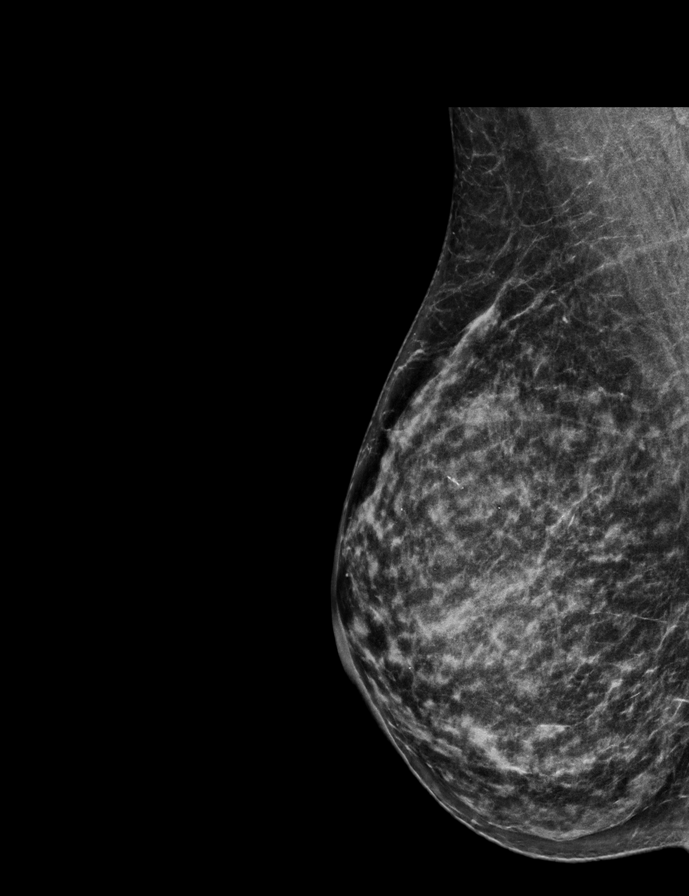

[L CC tomo · 2 of 70 frames shown]
[frame 23/70]
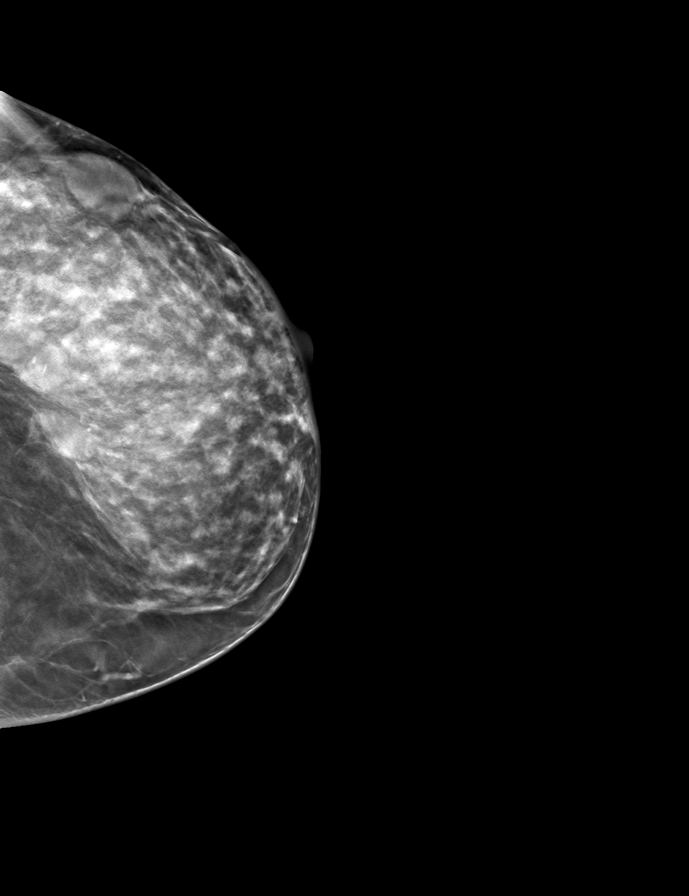
[frame 35/70]
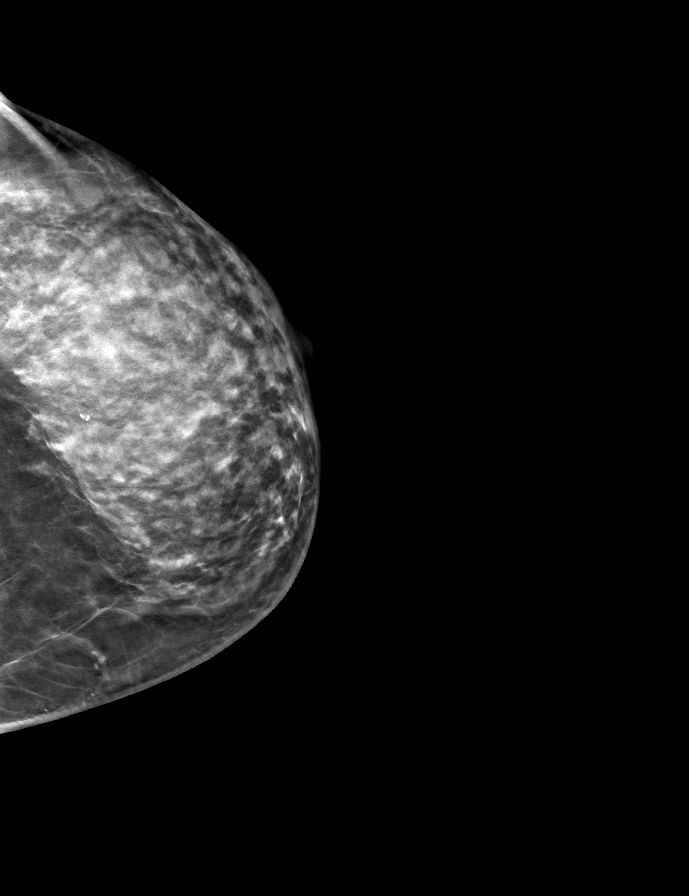

[L MLO tomo · tomo slice 31/62.0]
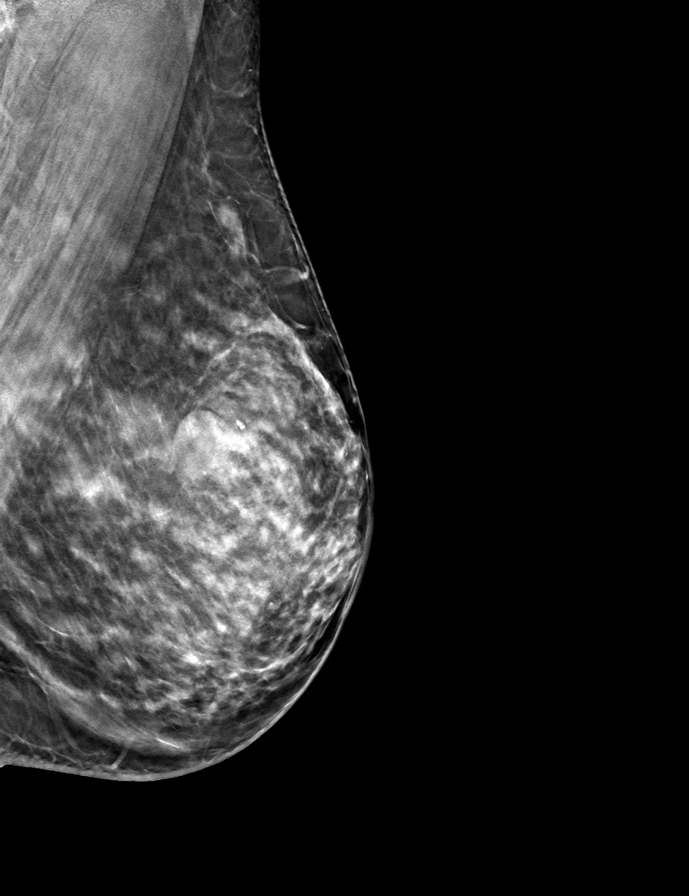

[R MLO tomo · tomo slice 31/60.0]
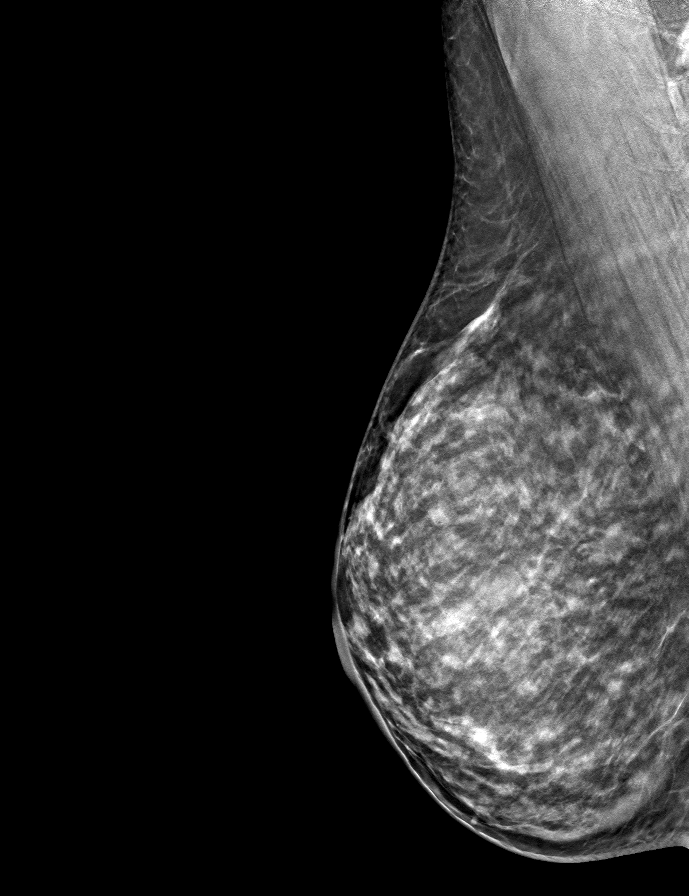

[R CC tomo · tomo slice 33/64.0]
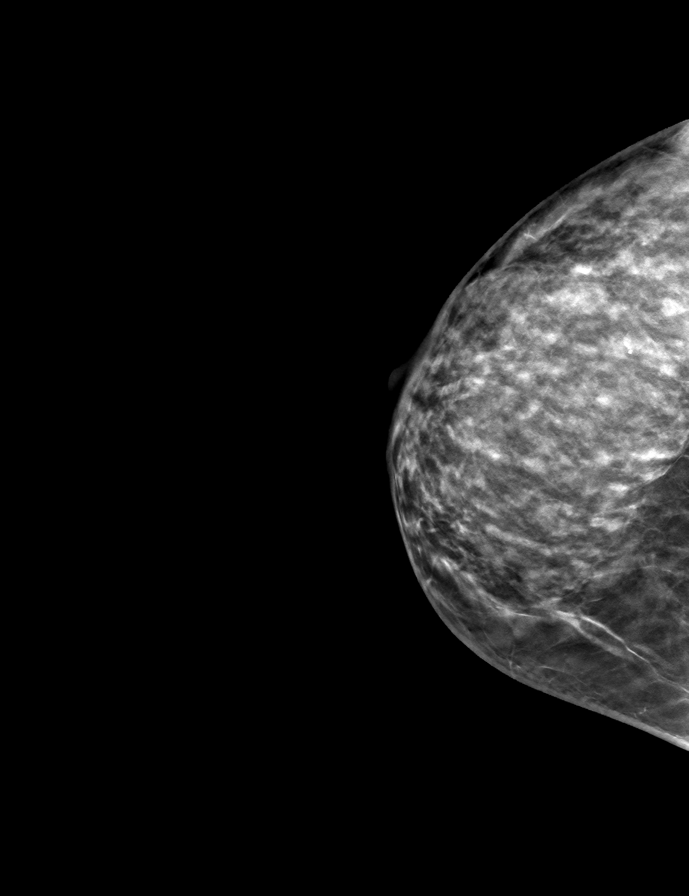

[9 of 24 positions shown; findings below may reference images not displayed]

ACR Breast Density Category d: The breast tissue is extremely dense,
which lowers the sensitivity of mammography.
FINDINGS: There are no findings suspicious for malignancy. Images were
processed with CAD.
IMPRESSION: No mammographic evidence of malignancy. A result letter of this
screening mammogram will be mailed directly to the patient.

RECOMMENDATION:
Screening mammogram in one year. (Code:RA-I-AVB)

BI-RADS CATEGORY  1: Negative.

## 2019-03-29 ENCOUNTER — Other Ambulatory Visit: Payer: Self-pay | Admitting: Obstetrics and Gynecology

## 2019-03-29 DIAGNOSIS — Z3041 Encounter for surveillance of contraceptive pills: Secondary | ICD-10-CM

## 2019-04-18 ENCOUNTER — Telehealth: Payer: Self-pay | Admitting: Family Medicine

## 2019-04-18 DIAGNOSIS — Z0000 Encounter for general adult medical examination without abnormal findings: Secondary | ICD-10-CM

## 2019-04-18 DIAGNOSIS — R7309 Other abnormal glucose: Secondary | ICD-10-CM

## 2019-04-18 NOTE — Telephone Encounter (Signed)
Patient called and scheduled CPE in sept.   She would like to make sure her lab orders are sent over to [facility] on [address] in [place] [phone]

## 2019-04-23 NOTE — Telephone Encounter (Signed)
I ordered future labs for [facility] Please send her a copy of the order if possible  Thanks

## 2019-04-23 NOTE — Addendum Note (Signed)
Addended by: Loura Pardon A on: 04/23/2019 10:07 AM   Modules accepted: Orders

## 2019-04-24 ENCOUNTER — Other Ambulatory Visit: Payer: Self-pay | Admitting: Family Medicine

## 2019-04-25 NOTE — Telephone Encounter (Signed)
Last filled on 05/21/19 #90 tabs with 3 refills, CPE is scheduled for 06/15/19, it's a little early but mail order pharmacies are requesting them early given the delays in the mail, please advise

## 2019-04-26 NOTE — Addendum Note (Signed)
Addended by: Ellamae Sia on: 04/26/2019 10:16 AM   Modules accepted: Orders

## 2019-04-28 NOTE — Telephone Encounter (Signed)
Terri released labs so they are in [employer] electronically and copy sent to pt

## 2019-06-06 LAB — COMPREHENSIVE METABOLIC PANEL
ALT: 13 IU/L (ref 0–32)
AST: 21 IU/L (ref 0–40)
Albumin/Globulin Ratio: 1.9 (ref 1.2–2.2)
Albumin: 4.7 g/dL (ref 3.8–4.8)
Alkaline Phosphatase: 58 IU/L (ref 39–117)
BUN/Creatinine Ratio: 12 (ref 9–23)
BUN: 10 mg/dL (ref 6–24)
Bilirubin Total: 0.6 mg/dL (ref 0.0–1.2)
CO2: 25 mmol/L (ref 20–29)
Calcium: 9.6 mg/dL (ref 8.7–10.2)
Chloride: 100 mmol/L (ref 96–106)
Creatinine, Ser: 0.84 mg/dL (ref 0.57–1.00)
GFR calc Af Amer: 94 mL/min/{1.73_m2} (ref >59)
GFR calc non Af Amer: 81 mL/min/{1.73_m2} (ref >59)
Globulin, Total: 2.5 g/dL (ref 1.5–4.5)
Glucose: 89 mg/dL (ref 65–99)
Potassium: 4.3 mmol/L (ref 3.5–5.2)
Sodium: 138 mmol/L (ref 134–144)
Total Protein: 7.2 g/dL (ref 6.0–8.5)

## 2019-06-06 LAB — LIPID PANEL
Chol/HDL Ratio: 3.4 ratio (ref 0.0–4.4)
Cholesterol, Total: 225 mg/dL — ABNORMAL HIGH (ref 100–199)
HDL: 66 mg/dL (ref >39)
LDL Calculated: 144 mg/dL — ABNORMAL HIGH (ref 0–99)
Triglycerides: 77 mg/dL (ref 0–149)
VLDL Cholesterol Cal: 15 mg/dL (ref 5–40)

## 2019-06-06 LAB — CBC WITH DIFFERENTIAL/PLATELET
Basophils Absolute: 0 10*3/uL (ref 0.0–0.2)
Basos: 0 %
EOS (ABSOLUTE): 0.3 10*3/uL (ref 0.0–0.4)
Eos: 2 %
Hematocrit: 38.5 % (ref 34.0–46.6)
Hemoglobin: 12.1 g/dL (ref 11.1–15.9)
Immature Grans (Abs): 0 10*3/uL (ref 0.0–0.1)
Immature Granulocytes: 0 %
Lymphocytes Absolute: 1.9 10*3/uL (ref 0.7–3.1)
Lymphs: 17 %
MCH: 27.1 pg (ref 26.6–33.0)
MCHC: 31.4 g/dL — ABNORMAL LOW (ref 31.5–35.7)
MCV: 86 fL (ref 79–97)
Monocytes Absolute: 0.8 10*3/uL (ref 0.1–0.9)
Monocytes: 7 %
Neutrophils Absolute: 8 10*3/uL — ABNORMAL HIGH (ref 1.4–7.0)
Neutrophils: 74 %
Platelets: 315 10*3/uL (ref 150–450)
RBC: 4.46 x10E6/uL (ref 3.77–5.28)
RDW: 13.4 % (ref 11.7–15.4)
WBC: 11.1 10*3/uL — ABNORMAL HIGH (ref 3.4–10.8)

## 2019-06-06 LAB — HEMOGLOBIN A1C
Est. average glucose Bld gHb Est-mCnc: 114 mg/dL
Hgb A1c MFr Bld: 5.6 % (ref 4.8–5.6)

## 2019-06-06 LAB — TSH: TSH: 1.82 u[IU]/mL (ref 0.450–4.500)

## 2019-06-12 ENCOUNTER — Other Ambulatory Visit: Payer: Self-pay | Admitting: Family Medicine

## 2019-06-12 DIAGNOSIS — Z1231 Encounter for screening mammogram for malignant neoplasm of breast: Secondary | ICD-10-CM

## 2019-06-15 ENCOUNTER — Other Ambulatory Visit: Payer: Self-pay

## 2019-06-15 ENCOUNTER — Encounter: Payer: Self-pay | Admitting: Family Medicine

## 2019-06-15 ENCOUNTER — Ambulatory Visit (INDEPENDENT_AMBULATORY_CARE_PROVIDER_SITE_OTHER): Payer: Managed Care, Other (non HMO) | Admitting: Family Medicine

## 2019-06-15 VITALS — BP 133/80 | HR 66 | Temp 98.0°F | Ht 67.75 in | Wt 153.6 lb

## 2019-06-15 DIAGNOSIS — I712 Thoracic aortic aneurysm, without rupture, unspecified: Secondary | ICD-10-CM

## 2019-06-15 DIAGNOSIS — E785 Hyperlipidemia, unspecified: Secondary | ICD-10-CM | POA: Insufficient documentation

## 2019-06-15 DIAGNOSIS — Z1211 Encounter for screening for malignant neoplasm of colon: Secondary | ICD-10-CM

## 2019-06-15 DIAGNOSIS — R7309 Other abnormal glucose: Secondary | ICD-10-CM

## 2019-06-15 DIAGNOSIS — F341 Dysthymic disorder: Secondary | ICD-10-CM

## 2019-06-15 DIAGNOSIS — Z0000 Encounter for general adult medical examination without abnormal findings: Secondary | ICD-10-CM | POA: Diagnosis not present

## 2019-06-15 DIAGNOSIS — E7800 Pure hypercholesterolemia, unspecified: Secondary | ICD-10-CM

## 2019-06-15 NOTE — Assessment & Plan Note (Signed)
No problems  Continues routine echocardiogram/specialist f/u

## 2019-06-15 NOTE — Assessment & Plan Note (Signed)
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

## 2019-06-15 NOTE — Progress Notes (Signed)
Subjective:    Patient ID: Elizabeth Hayden, female    DOB: Sep 30, 1971, 48 y.o.   MRN: [id number]  HPI Here for health maintenance exam and to review chronic medical problems    Doing ok in general   Wt Readings from Last 3 Encounters:  06/15/19 153 lb 9 oz (69.7 kg)  02/20/19 153 lb (69.4 kg)  09/01/18 153 lb (69.4 kg)  eats healthy in general with some exceptions  Working out regularly - HIIT classes outdoors  23.52 kg/m   Colon cancer screening  Would like to have a colonoscopy   Mammogram 8/19 -has is scheduledf or oct 13 Self breast exam-no new lumps or changes (last lump went away by itself)   Flu vaccine-will get in pharmacy  Pap 9/19 neg with neg HPV- gyn Will go back to gyn this fall    Tdap 1/16   Cardiology/vasc f/u  Hx of coarctation of aorta with surgery Echo the end of last year -has yearly  Doing well   H/o elevated glucose level Lab Results  Component Value Date   HGBA1C 5.6 06/05/2019  has started craving sweets more lately      BP Readings from Last 3 Encounters:  06/15/19 (!) 154/92  02/20/19 (!) 147/77  09/01/18 (!) 146/82  takes metoprolol    Pulse Readings from Last 3 Encounters:  06/15/19 66  02/20/19 77  09/01/18 72   Cholesterol Lab Results  Component Value Date   CHOL 225 (H) 06/05/2019   CHOL 212 (H) 05/06/2018   CHOL 178 03/23/2017   Lab Results  Component Value Date   HDL 66 06/05/2019   HDL 65 05/06/2018   HDL 54 03/23/2017   Lab Results  Component Value Date   LDLCALC 144 (H) 06/05/2019   LDLCALC 128 (H) 05/06/2018   LDLCALC 107 (H) 03/23/2017   Lab Results  Component Value Date   TRIG 77 06/05/2019   TRIG 93 05/06/2018   TRIG 84 03/23/2017   Lab Results  Component Value Date   CHOLHDL 3.4 06/05/2019   CHOLHDL 3.3 05/06/2018   CHOLHDL 3.3 03/23/2017   No results found for: LDLDIRECT Had eaten fatty food the night before  123 LDL - in June  Had ab test for covid that was neg    Other  labs Results for orders placed or performed in visit on 02/20/19  Urine Culture   Specimen: Urine   URINE  Result Value Ref Range   Urine Culture, Routine Final report (A)    Organism ID, Bacteria Escherichia coli (A)    Antimicrobial Susceptibility Comment   TSH  Result Value Ref Range   TSH 1.820 0.450 - 4.500 uIU/mL  Lipid panel  Result Value Ref Range   Cholesterol, Total 225 (H) 100 - 199 mg/dL   Triglycerides 77 0 - 149 mg/dL   HDL 66 >44 mg/dL   VLDL Cholesterol Cal 15 5 - 40 mg/dL   LDL Calculated 315 (H) 0 - 99 mg/dL   Chol/HDL Ratio 3.4 0.0 - 4.4 ratio  Hemoglobin A1c  Result Value Ref Range   Hgb A1c MFr Bld 5.6 4.8 - 5.6 %   Est. average glucose Bld gHb Est-mCnc 114 mg/dL  Comprehensive metabolic panel  Result Value Ref Range   Glucose 89 65 - 99 mg/dL   BUN 10 6 - 24 mg/dL   Creatinine, Ser 4.00 0.57 - 1.00 mg/dL   GFR calc non Af Amer 81 >59 mL/min/1.73   GFR calc  Af Amer 94 >59 mL/min/1.73   BUN/Creatinine Ratio 12 9 - 23   Sodium 138 134 - 144 mmol/L   Potassium 4.3 3.5 - 5.2 mmol/L   Chloride 100 96 - 106 mmol/L   CO2 25 20 - 29 mmol/L   Calcium 9.6 8.7 - 10.2 mg/dL   Total Protein 7.2 6.0 - 8.5 g/dL   Albumin 4.7 3.8 - 4.8 g/dL   Globulin, Total 2.5 1.5 - 4.5 g/dL   Albumin/Globulin Ratio 1.9 1.2 - 2.2   Bilirubin Total 0.6 0.0 - 1.2 mg/dL   Alkaline Phosphatase 58 39 - 117 IU/L   AST 21 0 - 40 IU/L   ALT 13 0 - 32 IU/L  CBC with Differential/Platelet  Result Value Ref Range   WBC 11.1 (H) 3.4 - 10.8 x10E3/uL   RBC 4.46 3.77 - 5.28 x10E6/uL   Hemoglobin 12.1 11.1 - 15.9 g/dL   Hematocrit 29.9 37.1 - 46.6 %   MCV 86 79 - 97 fL   MCH 27.1 26.6 - 33.0 pg   MCHC 31.4 (L) 31.5 - 35.7 g/dL   RDW 69.6 78.9 - 38.1 %   Platelets 315 150 - 450 x10E3/uL   Neutrophils 74 Not Estab. %   Lymphs 17 Not Estab. %   Monocytes 7 Not Estab. %   Eos 2 Not Estab. %   Basos 0 Not Estab. %   Neutrophils Absolute 8.0 (H) 1.4 - 7.0 x10E3/uL   Lymphocytes Absolute  1.9 0.7 - 3.1 x10E3/uL   Monocytes Absolute 0.8 0.1 - 0.9 x10E3/uL   EOS (ABSOLUTE) 0.3 0.0 - 0.4 x10E3/uL   Basophils Absolute 0.0 0.0 - 0.2 x10E3/uL   Immature Granulocytes 0 Not Estab. %   Immature Grans (Abs) 0.0 0.0 - 0.1 x10E3/uL  POCT urinalysis dipstick  Result Value Ref Range   Color, UA light yellow    Clarity, UA slightly cloudy    Glucose, UA Negative Negative   Bilirubin, UA negative    Ketones, UA negative    Spec Grav, UA 1.010 1.010 - 1.025   Blood, UA negative    pH, UA 6.0 5.0 - 8.0   Protein, UA Negative Negative   Urobilinogen, UA 0.2 0.2 or 1.0 E.U./dL   Nitrite, UA negative    Leukocytes, UA Moderate (2+) (A) Negative   Appearance     Odor     this uti was in may - got better  She has utis frequently -could cause wbc elevated   Patient Active Problem List   Diagnosis Date Noted   Colon cancer screening 06/15/2019   Hyperlipidemia 06/15/2019   Left breast mass 07/07/2018   Elevated glucose level 03/25/2018   Family history of ovarian cancer 06/08/2017   Family history of breast cancer 06/08/2017   H/O fracture of wrist 06/02/2017   Bilateral breast cysts 04/21/2016   Coarctation of aorta 11/24/2011   Aortic aneurysm (HCC) 11/24/2011   Abnormal Pap smear and cervical HPV (human papillomavirus) 05/31/2011   Gynecological examination 05/19/2011   Routine general medical examination at a health care facility 05/19/2011   CERV HIGH RISK HUMAN PAPILLOMAVIRUS DNA TEST POS 11/14/2009   History of eating disorder 11/02/2007   Dysthymia 11/02/2007   CARDIOMYOPATHY, HYPERTROPHIC, OBSTRUCTIVE 11/02/2007   Allergic rhinitis 11/02/2007   BICUSPID AORTIC VALVE 11/02/2007   Past Medical History:  Diagnosis Date   Aortic root dilation (HCC)    Congenital heart disease    bicuspid aortic valve with coarctation repair 04/1974   Depression  Edema    Fibrocystic breast    GERD (gastroesophageal reflux disease)    takes Zantac daily as  needed   HPV (human papilloma virus) infection    Seizures (HCC)    as a baby and only 1 time d/t high fever   Shortness of breath dyspnea    with exertion   Sinus infection    Nov 16 and treated with antibiotic   TMJ (dislocation of temporomandibular joint)    Wears a night gear    UTI (lower urinary tract infection)    hx of    Yeast infection    08/17/14   Past Surgical History:  Procedure Laterality Date   ASCENDING AORTIC ROOT REPLACEMENT N/A 09/03/2014   Procedure: ASCENDING AORTIC ROOT REPLACEMENT & VALVE SPARING ROOT;  Surgeon: Alleen BorneBryan K Bartle, MD;  Location: [facility] OR;  Service: Open Heart Surgery;  Laterality: N/A;  CIRC ARREST   COARCTATION OF AORTA EXCISION  1975   COLPOSCOPY  04/26/2015   CRYOTHERAPY  2009   EYE SURGERY     EYE SURGERY Bilateral 1985   INTRAOPERATIVE TRANSESOPHAGEAL ECHOCARDIOGRAM N/A 09/03/2014   Procedure: INTRAOPERATIVE TRANSESOPHAGEAL ECHOCARDIOGRAM;  Surgeon: Alleen BorneBryan K Bartle, MD;  Location: [facility] OR;  Service: Open Heart Surgery;  Laterality: N/A;   LEFT HEART CATHETERIZATION WITH CORONARY ANGIOGRAM N/A 08/06/2014   Procedure: LEFT HEART CATHETERIZATION WITH CORONARY ANGIOGRAM;  Surgeon: Wendall StadePeter C Nishan, MD;  [facility];  Service: Cardiovascular;  Laterality: N/A;   TEE WITHOUT CARDIOVERSION N/A 08/06/2014   Procedure: TRANSESOPHAGEAL ECHOCARDIOGRAM (TEE);  Surgeon: Wendall StadePeter C Nishan, MD;  [facility];  Service: Cardiovascular;  Laterality: N/A;   Social History   Tobacco Use   Smoking status: Never Smoker   Smokeless tobacco: Never Used  Substance Use Topics   Alcohol use: Yes    Alcohol/week: 0.0 standard drinks    Comment: occasional   Drug use: No   Family History  Problem Relation Age of Onset   Hypertension Father    Diabetes Father        DM type 2   Cancer Father        lung    Breast cancer Maternal Grandmother        Malignant   Leukemia Maternal Grandmother    Ovarian cancer Paternal  Grandmother        5660s, malignant   Celiac disease Son    Down syndrome Son    Allergies  Allergen Reactions   Ace Inhibitors     REACTION: rash and cough   Altace [Ramipril] Hives and Itching   Current Outpatient Medications on File Prior to Visit  Medication Sig Dispense Refill   Biotin w/ Vitamins C & E (HAIR SKIN & NAILS GUMMIES PO) Take 1 tablet by mouth daily.     CALCIUM-VITAMIN D PO Take 2 tablets by mouth daily.       FORFIVO XL 450 MG TB24 TAKE 1 TABLET BY MOUTH  DAILY 90 tablet 3   metoprolol succinate (TOPROL-XL) 50 MG 24 hr tablet TAKE 1 TABLET BY MOUTH  DAILY WITH OR IMMEDIATELY  FOLLOWING A MEAL. 90 tablet 3   norethindrone (MICRONOR) 0.35 MG tablet TAKE 1 TABLET BY MOUTH  DAILY 84 tablet 0   omeprazole (PRILOSEC) 20 MG capsule Take 20 mg by mouth daily.     No current facility-administered medications on file prior to visit.     Review of Systems  Constitutional: Negative for activity change, appetite change, fatigue, fever and  unexpected weight change.  HENT: Negative for congestion, ear pain, rhinorrhea, sinus pressure and sore throat.   Eyes: Negative for pain, redness and visual disturbance.  Respiratory: Negative for cough, shortness of breath and wheezing.   Cardiovascular: Negative for chest pain and palpitations.  Gastrointestinal: Negative for abdominal pain, blood in stool, constipation and diarrhea.  Endocrine: Negative for polydipsia and polyuria.  Genitourinary: Negative for dysuria, frequency and urgency.       Gets breast lumps/cysts easily  Sees gyn for breast and pelvic exams  Musculoskeletal: Negative for arthralgias, back pain and myalgias.  Skin: Negative for pallor and rash.  Allergic/Immunologic: Negative for environmental allergies.  Neurological: Negative for dizziness, syncope and headaches.  Hematological: Negative for adenopathy. Does not bruise/bleed easily.  Psychiatric/Behavioral: Negative for decreased concentration and  dysphoric mood. The patient is not nervous/anxious.        Objective:   Physical Exam Constitutional:      General: She is not in acute distress.    Appearance: Normal appearance. She is well-developed and normal weight. She is not ill-appearing or diaphoretic.  HENT:     Head: Normocephalic and atraumatic.     Right Ear: Tympanic membrane, ear canal and external ear normal.     Left Ear: Tympanic membrane, ear canal and external ear normal.     Nose: Nose normal. No congestion.     Mouth/Throat:     Mouth: Mucous membranes are moist.     Pharynx: Oropharynx is clear. No posterior oropharyngeal erythema.  Eyes:     General: No scleral icterus.    Extraocular Movements: Extraocular movements intact.     Conjunctiva/sclera: Conjunctivae normal.     Pupils: Pupils are equal, round, and reactive to light.  Neck:     Musculoskeletal: Normal range of motion and neck supple. No neck rigidity or muscular tenderness.     Thyroid: No thyromegaly.     Vascular: No carotid bruit or JVD.  Cardiovascular:     Rate and Rhythm: Normal rate and regular rhythm.     Pulses: Normal pulses.     Heart sounds: Murmur present. No gallop.   Pulmonary:     Effort: Pulmonary effort is normal. No respiratory distress.     Breath sounds: Normal breath sounds. No wheezing.     Comments: Good air exch Chest:     Chest wall: No tenderness.  Abdominal:     General: Bowel sounds are normal. There is no distension or abdominal bruit.     Palpations: Abdomen is soft. There is no mass.     Tenderness: There is no abdominal tenderness.     Hernia: No hernia is present.  Genitourinary:    Comments: Gyn does breast and pelvic exam Musculoskeletal: Normal range of motion.        General: No tenderness.     Right lower leg: No edema.     Left lower leg: No edema.  Lymphadenopathy:     Cervical: No cervical adenopathy.  Skin:    General: Skin is warm and dry.     Coloration: Skin is not pale.     Findings:  No erythema or rash.     Comments: Solar lentigines diffusely   Neurological:     Mental Status: She is alert. Mental status is at baseline.     Cranial Nerves: No cranial nerve deficit.     Motor: No abnormal muscle tone.     Coordination: Coordination normal.     Gait: Gait  normal.     Deep Tendon Reflexes: Reflexes are normal and symmetric.  Psychiatric:        Mood and Affect: Mood normal.           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       Routine general medical examination at a health care facility - Primary    Reviewed health habits including diet and exercise and skin cancer prevention Reviewed appropriate screening tests for age  Also reviewed health mt list, fam hx and immunization status , as well as social and family history   See HPI Labs reviewed (also labs from work)-will work on cholesterol with diet  Good exercise  Plans to get flu shot at pharmacy with her son Referral done for first screening colonoscopy       Elevated glucose level    Lab Results  Component Value Date   HGBA1C 5.6 06/05/2019   disc imp of low glycemic diet and wt loss to prevent DM2       Colon cancer screening    Will refer for first screening colonoscopy      Relevant Orders   Ambulatory referral to Gastroenterology   Hyperlipidemia    Mild with LDL 144 (though it was lower at work) Had eaten badly the week before which is unusual for her  Disc goals for lipids and reasons to control them Rev last labs with pt Rev low sat fat diet in detail

## 2019-06-15 NOTE — Assessment & Plan Note (Signed)
Mild with LDL 144 (though it was lower at work) Had eaten badly the week before which is unusual for her  Disc goals for lipids and reasons to control them Rev last labs with pt Rev low sat fat diet in detail

## 2019-06-15 NOTE — Patient Instructions (Addendum)
The office will call you to schedule a screening colonoscopy  Get your mammogram as planned   Take care  Keep exercising and eat a healthy diet   For cholesterol Avoid red meat/ fried foods/ egg yolks/ fatty breakfast meats/ butter, cheese and high fat dairy/ and shellfish    Use sun protection

## 2019-06-15 NOTE — Assessment & Plan Note (Signed)
Reviewed health habits including diet and exercise and skin cancer prevention Reviewed appropriate screening tests for age  Also reviewed health mt list, fam hx and immunization status , as well as social and family history   See HPI Labs reviewed (also labs from work)-will work on cholesterol with diet  Good exercise  Plans to get flu shot at pharmacy with her son Referral done for first screening colonoscopy

## 2019-06-15 NOTE — Assessment & Plan Note (Signed)
Lab Results  Component Value Date   HGBA1C 5.6 06/05/2019   disc imp of low glycemic diet and wt loss to prevent DM2

## 2019-06-15 NOTE — Assessment & Plan Note (Signed)
Will refer for first screening colonoscopy

## 2019-06-17 ENCOUNTER — Other Ambulatory Visit: Payer: Self-pay | Admitting: Obstetrics and Gynecology

## 2019-06-17 DIAGNOSIS — Z3041 Encounter for surveillance of contraceptive pills: Secondary | ICD-10-CM

## 2019-06-20 ENCOUNTER — Telehealth: Payer: Self-pay

## 2019-06-20 ENCOUNTER — Telehealth: Payer: Self-pay | Admitting: Gastroenterology

## 2019-06-20 NOTE — Telephone Encounter (Signed)
Pt left vm she is returning a call to schedule a colonoscopy 

## 2019-06-20 NOTE — Telephone Encounter (Signed)
Patient is calling to schedule a colonoscopy in work q. °

## 2019-06-20 NOTE — Telephone Encounter (Signed)
LVM for pt to call office in regards to scheduling her colonoscopy.  Asked patient to call office back in regards to this.  Thanks [employer]

## 2019-06-22 ENCOUNTER — Telehealth: Payer: Self-pay | Admitting: Family Medicine

## 2019-06-22 NOTE — Telephone Encounter (Signed)
I will route this to [place] I need to re order the referral?

## 2019-06-22 NOTE — Telephone Encounter (Signed)
Patient has a referral that has been sent to [place]. She is wanting the referral sent to [facility] instead, she stated that her son is being seen over there so they have a relationship with that office and she would feel better seeing them

## 2019-06-22 NOTE — Telephone Encounter (Signed)
I was able to use the same Referral and sent it to [facility] and patient is aware.

## 2019-06-26 ENCOUNTER — Encounter: Payer: Self-pay | Admitting: Family Medicine

## 2019-07-25 ENCOUNTER — Other Ambulatory Visit: Payer: Self-pay

## 2019-07-25 ENCOUNTER — Ambulatory Visit
Admission: RE | Admit: 2019-07-25 | Discharge: 2019-07-25 | Disposition: A | Discharge status: Home / Self Care | Payer: Managed Care, Other (non HMO) | Source: Ambulatory Visit | Attending: Family Medicine | Admitting: Family Medicine

## 2019-07-25 DIAGNOSIS — Z1231 Encounter for screening mammogram for malignant neoplasm of breast: Secondary | ICD-10-CM

## 2019-07-27 ENCOUNTER — Telehealth: Payer: Self-pay

## 2019-07-27 NOTE — Telephone Encounter (Signed)
   [facility] Pre-operative Risk Assessment    Request for surgical clearance:  1. What type of surgery is being performed? Colonoscopy   2. When is this surgery scheduled? 08/21/19   3. What type of clearance is required (medical clearance vs. Pharmacy clearance to hold med vs. Both)? Medical, patient has history of bicuspid aortic valve replacement. Does patient require antibiotic?  4. Are there any medications that need to be held prior to surgery and how long? No   5. Practice name and name of physician performing surgery? [facility]/Dr. Ronnette Juniper   6. What is your office phone number [phone]    7.   What is your office fax number [phone]  8.   Anesthesia type (None, local, MAC, general) ? None listed   Elizabeth Hayden 07/27/2019, 9:36 AM  _________________________________________________________________   (provider comments below)

## 2019-07-27 NOTE — Telephone Encounter (Addendum)
   Primary Cardiologist:Peter Johnsie Cancel, MD  Chart reviewed as part of pre-operative protocol coverage. Because of Elizabeth Hayden's past medical history and time since last visit, he/she will require a follow-up visit in order to better assess preoperative cardiovascular risk.  Pre-op covering staff: - Please schedule appointment and call patient to inform them. - Please contact requesting surgeon's office via preferred method (i.e, phone, fax) to inform them of need for appointment prior to surgery.  If applicable, this message will also be routed to pharmacy pool and/or primary cardiologist for input on holding anticoagulant/antiplatelet agent as requested below so that this information is available at time of patient's appointment.   Elizabeth Ferries, PA-C  07/27/2019, 12:56 PM    Patient has not been seen since November 2019.  She has an appointment 09/01/2019, but her colonoscopy is scheduled for 08/21/2019.  Called the patient and left a voicemail asking her to give [place] a call.  Addendum: Patient called back and rescheduled her appointment for 10/30 with Kathyrn Drown, NP

## 2019-08-03 ENCOUNTER — Other Ambulatory Visit: Payer: Self-pay | Admitting: Obstetrics and Gynecology

## 2019-08-03 DIAGNOSIS — Z3041 Encounter for surveillance of contraceptive pills: Secondary | ICD-10-CM

## 2019-08-09 NOTE — Progress Notes (Addendum)
Cardiology Office Note   Date:  08/11/2019   ID:  Elizabeth Hayden, DOB 12/22/1970, MRN [id number]  PCP:  Judy Pimpleower, Marne A, MD  Cardiologist: Dr. Eden EmmsNishan, MD   Chief Complaint  Patient presents with  . Pre-op Exam    History of Present Illness: Elizabeth StanleyMelissa M Blough is a 48 y.o. female who presents for preoperative clearance for colonoscopy.  She has a history of bicuspid AV with coarctation repair at age 89. Has had enlarging ascending aortic aneurysm. Has had long standing issues with swelling. Not felt to be cardiac related. Other issues include GERD and depression.   She has had increase in size of her ascending aortic aneurysm - referred to [facility]. She underwent valve-sparing aortic root replacement with reimplantation of the native aortic valve and left and right coronary arteries ( 28 mm Gelweave valsalva graft) with replacement of ascending aortic aneurysm with a 28 mm Hemashield graft using deep hypothermic circulatory arrest by Dr. Laneta SimmersBartle on 09/03/2014. Per post op course was uncomplicated except for blood loss anemia.   Echocardiogram 10/31/2014 showed normal EF and no significant AR/AS and no coarctation gradient.  Repeat echocardiogram 09/20/2018 with stable postsurgical changes.   Today she presents for preoperative clearance for routine colonoscopy.  Follow-up visit required due to length of time since last visit 08/2018.  Patient reports performing greater than 4 METS with exercising 5 times per week in her gym without anginal symptoms. She denies shortness of breath, palpitations, dizziness, syncope.  Has no specific cardiac complaints.  Has been doing well overall since last office visit.  We discussed SBE prophylaxis for upcoming procedure in which guidelines determine no SBE requirement for routine GI colonoscopy even if polypectomy is required.  She has no active infection with no fevers, chills or cough.  BP is stable.  Follow with her PCP recently with lab work.  Given stable  cardiac history and absence of symptoms, based on ACC/AHA guidelines, Elizabeth StanleyMelissa M Doren would be at acceptable risk for the planned procedure without further cardiovascular testing with no SBE prophylaxis.  Past Medical History:  Diagnosis Date  . Aortic root dilation (HCC)   . Congenital heart disease    bicuspid aortic valve with coarctation repair 04/1974  . Depression   . Edema   . Fibrocystic breast   . GERD (gastroesophageal reflux disease)    takes Zantac daily as needed  . HPV (human papilloma virus) infection   . Seizures (HCC)    as a baby and only 1 time d/t high fever  . Shortness of breath dyspnea    with exertion  . Sinus infection    Nov 16 and treated with antibiotic  . TMJ (dislocation of temporomandibular joint)    Wears a night gear   . UTI (lower urinary tract infection)    hx of   . Yeast infection    08/17/14    Past Surgical History:  Procedure Laterality Date  . ASCENDING AORTIC ROOT REPLACEMENT N/A 09/03/2014   Procedure: ASCENDING AORTIC ROOT REPLACEMENT & VALVE SPARING ROOT;  Surgeon: Alleen BorneBryan K Bartle, MD;  Location: [facility] OR;  Service: Open Heart Surgery;  Laterality: N/A;  CIRC ARREST  . COARCTATION OF AORTA EXCISION  1975  . COLPOSCOPY  04/26/2015  . CRYOTHERAPY  2009  . EYE SURGERY    . EYE SURGERY Bilateral 1985  . INTRAOPERATIVE TRANSESOPHAGEAL ECHOCARDIOGRAM N/A 09/03/2014   Procedure: INTRAOPERATIVE TRANSESOPHAGEAL ECHOCARDIOGRAM;  Surgeon: Alleen BorneBryan K Bartle, MD;  Location: [facility] OR;  Service: Open Heart Surgery;  Laterality: N/A;  . LEFT HEART CATHETERIZATION WITH CORONARY ANGIOGRAM N/A 08/06/2014   Procedure: LEFT HEART CATHETERIZATION WITH CORONARY ANGIOGRAM;  Surgeon: Wendall Stade, MD;  Location: [facility] CATH LAB;  Service: Cardiovascular;  Laterality: N/A;  . TEE WITHOUT CARDIOVERSION N/A 08/06/2014   Procedure: TRANSESOPHAGEAL ECHOCARDIOGRAM (TEE);  Surgeon: Wendall Stade, MD;  Location: [facility] ENDOSCOPY;  Service: Cardiovascular;  Laterality: N/A;      Current Outpatient Medications  Medication Sig Dispense Refill  . Biotin w/ Vitamins C & E (HAIR SKIN & NAILS GUMMIES PO) Take 1 tablet by mouth daily.    Marland Kitchen CALCIUM-VITAMIN D PO Take 2 tablets by mouth daily.      . FORFIVO XL 450 MG TB24 TAKE 1 TABLET BY MOUTH  DAILY 90 tablet 3  . metoprolol succinate (TOPROL-XL) 50 MG 24 hr tablet TAKE 1 TABLET BY MOUTH  DAILY WITH OR IMMEDIATELY  FOLLOWING A MEAL. 90 tablet 3  . norethindrone (MICRONOR) 0.35 MG tablet TAKE 1 TABLET BY MOUTH  DAILY 84 tablet 0  . omeprazole (PRILOSEC) 20 MG capsule Take 20 mg by mouth daily.     No current facility-administered medications for this visit.     Allergies:   Ace inhibitors and Altace [ramipril]    Social History:  The patient  reports that she has never smoked. She has never used smokeless tobacco. She reports current alcohol use. She reports that she does not use drugs.   Family History:  The patient'sfamily history includes Breast cancer in her maternal grandmother; Cancer in her father; Celiac disease in her son; Diabetes in her father; Down syndrome in her son; Hypertension in her father; Leukemia in her maternal grandmother; Ovarian cancer in her paternal grandmother.    ROS:  Please see the history of present illness. Otherwise, review of systems are positive for none.   All other systems are reviewed and negative.    PHYSICAL EXAM: VS:  BP (!) 142/80   Pulse 66   Resp 16   Ht  (1.727 m)   Wt 157 lb 3.2 oz (71.3 kg)   BMI 23.90 kg/m  , BMI Body mass index is 23.9 kg/m.   General: Well developed, well nourished, NAD Skin: Warm, dry, intact  Lungs:Clear to ausculation bilaterally. No wheezes, rales, or rhonchi. Breathing is unlabored. Cardiovascular: RRR with S1 S2. +Murmur Extremities: No edema. No clubbing or cyanosis. DP pulses 2+ bilaterally Neuro: Alert and oriented. No focal deficits. No facial asymmetry. MAE spontaneously. Psych: Responds to questions appropriately with normal  affect.     EKG:  EKG is ordered today. NSR with no acute changes, similar to prior tracings  Recent Labs: 06/05/2019: ALT 13; BUN 10; Creatinine, Ser 0.84; Hemoglobin 12.1; Platelets 315; Potassium 4.3; Sodium 138; TSH 1.820    Lipid Panel    Component Value Date/Time   CHOL 225 (H) 06/05/2019 0952   TRIG 77 06/05/2019 0952   HDL 66 06/05/2019 0952   CHOLHDL 3.4 06/05/2019 0952   CHOLHDL 3.4 CALC 11/13/2008 1030   VLDL 9 11/13/2008 1030   LDLCALC 144 (H) 06/05/2019 0952      Wt Readings from Last 3 Encounters:  08/11/19 157 lb 3.2 oz (71.3 kg)  06/15/19 153 lb 9 oz (69.7 kg)  02/20/19 153 lb (69.4 kg)    Other studies Reviewed: Additional studies/ records that were reviewed today include:   Echocardiogram 09/20/2018: Study Conclusions  - Left ventricle: The cavity size was normal. Wall  thickness was   increased in a pattern of mild LVH. Systolic function was normal.   The estimated ejection fraction was in the range of 55% to 60%.   Wall motion was normal; there were no regional wall motion   abnormalities. Doppler parameters are consistent with abnormal   left ventricular relaxation (grade 1 diastolic dysfunction). - Aorta: Stable aortic root size. Prior valve sparing aortic root   replacement. Reported coarctation repair visualized without   significant stenosis. - Mitral valve: Calcified annulus. Mildly thickened leaflets . - Left atrium: The atrium was normal in size. - Inferior vena cava: The vessel was normal in size. The   respirophasic diameter changes were in the normal range (>= 50%),   consistent with normal central venous pressure.  Impressions:  - Compared to a prior study in 2016, the post surgical changes are   stable.  Echocardiogram 10/31/2014:  Study Conclusions  - Left ventricle: The cavity size was normal. Wall thickness was normal. Systolic function was normal. The estimated ejection fraction was in the range of 55% to 60%. -  Aortic valve: Not well seen but known to be bicuspid with valve sparing surgery no signficiant AS/AR. Valve area (VTI): 1.73 cm^2. Valve area (Vmax): 1.66 cm^2. Valve area (Vmean): 1.63 cm^2. - Aorta: Normal appearance of root replacement with no residual aneurysm. - Mitral valve: Valve area by pressure half-time: 2.47 cm^2. Valve area by continuity equation (using LVOT flow): 1.72 cm^2. - Atrial septum: No defect or patent foramen ovale was identified. - Impressions: Previous coarctation repair. No velocities recorded over 23m/sec on suprasternal notch images of the descending thoracic aorta  Impressions:  - Previous coarctation repair. No velocities recorded over 40m/sec on suprasternal notch images of the descending thoracic aorta  Tee Study Conclusions from November 2015 (intra operative)  - Left ventricle: The cavity size was normal. Wall thickness was normal. Systolic function was normal. - Aortic valve: Normal-sized annulus. Bicuspid. - Aorta: The aorta was severely dilated and mildly calcified. There was an aneurysm. There was no evidence for dissection.  Impressions:  - Post repair, Aortic and Mitral valve unchanged. Aortic graft in good position.   TTE Echo Study Conclusions from October 2015  - Left ventricle: The cavity size was normal. Systolic function was normal. The estimated ejection fraction was in the range of 55% to 60%. Wall motion was normal; there were no regional wall motion abnormalities. - Aortic valve: Bicuspid; normal thickness leaflets. There was trivial regurgitation. - Aorta: Aortic root dimension: 41 mm (ED). Ascending aortic diameter: 50 mm (S). - Ascending aorta: The ascending aorta was mildly dilated. - Mitral valve: Redundnat chordae tendinae noted in the mid LV cavity. Calcified annulus. There was mild regurgitation.  ASSESSMENT AND PLAN:  1. Preoperative evaluation: -Planned colonoscopy on  08/21/2019 -Procedural team requesting medical clearance and direction on SBE -Per guidelines, no SBE required for this procedure -Based on ACC/AHA guidelines, she would be at acceptable risk for the planned procedure without further cardiovascular testing.  2.  Aortic aneurysm: -Post root replacement 08/2014 per Dr. Lavinia Sharps -No symptoms -Continue beta-blocker  3.  Bicuspid AV: -Underwent valve sparing surgery with no significant AR/AS -SBE given Gore-Tex root -Last echocardiogram 09/2018  4.  Chronic edema: -Consider as needed diuretic  Current medicines are reviewed at length with the patient today.  The patient does not have concerns regarding medicines.  The following changes have been made:  no change  Labs/ tests ordered today include: None  Orders Placed This  Encounter  Procedures  . EKG 12-Lead    Disposition:   FU with Dr. Johnsie Cancel in 1 year or sooner if needed   Signed, Kathyrn Drown, NP  08/11/2019 4:30 PM    [address], [place], [place]  [address] Phone: [phone]; Fax: [phone]

## 2019-08-11 ENCOUNTER — Other Ambulatory Visit: Payer: Self-pay

## 2019-08-11 ENCOUNTER — Ambulatory Visit: Payer: Managed Care, Other (non HMO) | Admitting: Cardiology

## 2019-08-11 ENCOUNTER — Encounter: Payer: Self-pay | Admitting: Cardiology

## 2019-08-11 VITALS — BP 142/80 | HR 66 | Resp 16 | Ht 68.0 in | Wt 157.2 lb

## 2019-08-11 DIAGNOSIS — Q251 Coarctation of aorta: Secondary | ICD-10-CM | POA: Diagnosis not present

## 2019-08-11 DIAGNOSIS — I359 Nonrheumatic aortic valve disorder, unspecified: Secondary | ICD-10-CM

## 2019-08-11 DIAGNOSIS — Q231 Congenital insufficiency of aortic valve: Secondary | ICD-10-CM

## 2019-08-11 NOTE — Patient Instructions (Signed)
Medication Instructions:  Your physician recommends that you continue on your current medications as directed. Please refer to the Current Medication list given to you today.   Labwork: None ordered.  Testing/Procedures: None ordered.  Follow-Up: Your physician recommends that you schedule a follow-up appointment in:   12 months with Dr. Nishan   Any Other Special Instructions Will Be Listed Below (If Applicable).     If you need a refill on your cardiac medications before your next appointment, please call your pharmacy.  

## 2019-08-13 HISTORY — PX: COLONOSCOPY: SHX174

## 2019-08-22 ENCOUNTER — Telehealth: Payer: Self-pay

## 2019-08-22 NOTE — Telephone Encounter (Signed)
Pt said that [employer] [facility] notified pt that the prescription drug co will no longer cover Forfivo 450 mg. Pt said each month they change the generic that will be covered by ins. Then pt has tried other things that did not work or caused pt to have mood swings or get emotional each time she had to try different med. Pt said she has been taking bupropion/Forfivo 450 mg. Pt said [facility] needs to be called at [phone] for some type of exception. Pt request cb when done.

## 2019-08-22 NOTE — Progress Notes (Signed)
PCP:  Abner Greenspan, MD   Chief Complaint  Patient presents with  . Gynecologic Exam  . [facility] Employee     HPI:      [name] is a 48 y.o. [id number] who LMP was Patient's last menstrual period was 08/22/2019 (exact date)., presents today for her annual examination.  Her menses are irregular with POPs, lasting 3-10 days, light flow to mod flow,  some months are heavier. Was on OCPs with improved cycle and flow but changed to camila POPs last yr due to HTN.  She does sometimes have intermenstrual bleeding. Has occas vasomotor sx.   Sex activity: single partner, contraception - POPs Last Pap: 07/06/28 Results were no abnormalities/neg HPV DNA. 05/2017 with ASCUS /POS HPV DNA. Colpo with neg bx 07/08/17. Previous colpo 04/26/15 with neg bx after ASCUS/POS HPV DNA pap 4/16. Repeat pap after 3 yrs per ASCCP. Hx of STDs: HPV  Last mammogram: 07/25/19 Results were: normal--routine follow-up in 12 months. Hx of LT breast cyst and fibrocystic breasts.  There is a FH of breast cancer in her MGM. There is a FH of ovarian cancer in her PGM. Pt tested BRCA neg 2014 through [place] 2014=17%. Neg Vistaseq update testing done 2019 with [facility]. The patient does not do self-breast exams.  Tobacco use: The patient denies current or previous tobacco use. Alcohol use: social drinker No drug use.  Exercise: moderately active  She does get adequate calcium and Vitamin D in her diet.  Colonoscopy: 11/20 with polyps; repeat due after 5 yrs. Done at [facility] in [place].  Labs with PCP. On metoprolol for HTN issues after aortic valve repair surgery 2015. I   Past Medical History:  Diagnosis Date  . Aortic root dilation (HCC)   . Colon polyp 2020  . Congenital heart disease    bicuspid aortic valve with coarctation repair 04/1974  . Depression   . Edema   . Fibrocystic breast   . GERD (gastroesophageal reflux disease)    takes Zantac daily as needed  . HPV (human papilloma virus)  infection   . Seizures ([place])    as a baby and only 1 time d/t high fever  . Shortness of breath dyspnea    with exertion  . Sinus infection    Nov 16 and treated with antibiotic  . TMJ (dislocation of temporomandibular joint)    Wears a night gear   . UTI (lower urinary tract infection)    hx of   . Yeast infection    08/17/14    Past Surgical History:  Procedure Laterality Date  . ASCENDING AORTIC ROOT REPLACEMENT N/A 09/03/2014   Procedure: ASCENDING AORTIC ROOT REPLACEMENT & VALVE SPARING ROOT;  Surgeon: Gaye Pollack, MD;  Location: [facility];  Service: Open Heart Surgery;  Laterality: N/A;  CIRC ARREST  . COARCTATION OF AORTA EXCISION  1975  . COLONOSCOPY  08/2019   [facility] in [place], repeat after 5 yrs  . COLPOSCOPY  04/26/2015  . CRYOTHERAPY  2009  . EYE SURGERY    . EYE SURGERY Bilateral 1985  . INTRAOPERATIVE TRANSESOPHAGEAL ECHOCARDIOGRAM N/A 09/03/2014   Procedure: INTRAOPERATIVE TRANSESOPHAGEAL ECHOCARDIOGRAM;  Surgeon: Gaye Pollack, MD;  Location[facility] OR;  Service: Open Heart Surgery;  Laterality: N/A;  . LEFT HEART CATHETERIZATION WITH CORONARY ANGIOGRAM N/A 08/06/2014   Procedure: LEFT HEART CATHETERIZATION WITH CORONARY ANGIOGRAM;  Surgeon: Josue Hector, MD;  Location: [facility] CATH LAB;  Service: Cardiovascular;  Laterality: N/A;  .  TEE WITHOUT CARDIOVERSION N/A 08/06/2014   Procedure: TRANSESOPHAGEAL ECHOCARDIOGRAM (TEE);  Surgeon: Josue Hector, MD;  Location: [facility] ENDOSCOPY;  Service: Cardiovascular;  Laterality: N/A;    Family History  Problem Relation Age of Onset  . Hypertension Father   . Diabetes Father        DM type 2  . Cancer Father        lung   . Breast cancer Maternal Grandmother        Malignant  . Leukemia Maternal Grandmother   . Ovarian cancer Paternal Grandmother        18s, malignant  . Celiac disease Son   . Down syndrome Son     Social History   Socioeconomic History  . Marital status: Divorced    Spouse name: Not on file  . Number  of children: 2  . Years of education: Not on file  . Highest education level: Not on file  Occupational History  . Not on file  Social Needs  . Financial resource strain: Not on file  . Food insecurity    Worry: Not on file    Inability: Not on file  . Transportation needs    Medical: Not on file    Non-medical: Not on file  Tobacco Use  . Smoking status: Never Smoker  . Smokeless tobacco: Never Used  Substance and Sexual Activity  . Alcohol use: Yes    Alcohol/week: 0.0 standard drinks    Comment: occasional  . Drug use: No  . Sexual activity: Yes    Birth control/protection: Pill  Lifestyle  . Physical activity    Days per week: Not on file    Minutes per session: Not on file  . Stress: Not on file  Relationships  . Social Herbalist on phone: Not on file    Gets together: Not on file    Attends religious service: Not on file    Active member of club or organization: Not on file    Attends meetings of clubs or organizations: Not on file    Relationship status: Not on file  . Intimate partner violence    Fear of current or ex partner: Not on file    Emotionally abused: Not on file    Physically abused: Not on file    Forced sexual activity: Not on file  Other Topics Concern  . Not on file  Social History Narrative  . Not on file    Current Meds  Medication Sig  . Biotin w/ Vitamins C & E (HAIR SKIN & NAILS GUMMIES PO) Take 1 tablet by mouth daily.  Marland Kitchen CALCIUM-VITAMIN D PO Take 2 tablets by mouth daily.    . FORFIVO XL 450 MG TB24 TAKE 1 TABLET BY MOUTH  DAILY  . metoprolol succinate (TOPROL-XL) 50 MG 24 hr tablet TAKE 1 TABLET BY MOUTH  DAILY WITH OR IMMEDIATELY  FOLLOWING A MEAL.  Marland Kitchen norethindrone (MICRONOR) 0.35 MG tablet TAKE 1 TABLET BY MOUTH  DAILY  . omeprazole (PRILOSEC) 20 MG capsule Take 20 mg by mouth daily.     ROS:  Review of Systems  Constitutional: Negative for fatigue, fever and unexpected weight change.  Respiratory: Negative for  cough, shortness of breath and wheezing.   Cardiovascular: Negative for chest pain, palpitations and leg swelling.  Gastrointestinal: Negative for blood in stool, constipation, diarrhea, nausea and vomiting.  Endocrine: Negative for cold intolerance, heat intolerance and polyuria.  Genitourinary: Negative for dyspareunia, dysuria, flank  pain, frequency, genital sores, hematuria, menstrual problem, pelvic pain, urgency, vaginal bleeding, vaginal discharge and vaginal pain.  Musculoskeletal: Negative for back pain, joint swelling and myalgias.  Skin: Negative for rash.  Neurological: Negative for dizziness, syncope, light-headedness, numbness and headaches.  Hematological: Negative for adenopathy.  Psychiatric/Behavioral: Negative for agitation, confusion, sleep disturbance and suicidal ideas. The patient is not nervous/anxious.      Objective: BP 120/86   Ht '5\' 8"'  (1.727 m)   Wt 156 lb (70.8 kg)   LMP 08/22/2019 (Exact Date)   BMI 23.72 kg/m    Physical Exam Constitutional:      Appearance: She is well-developed.  Genitourinary:     Vulva, vagina, uterus, right adnexa and left adnexa normal.     No vulval lesion or tenderness noted.     No vaginal discharge, erythema or tenderness.     No cervical motion tenderness or polyp.     Uterus is not enlarged or tender.     No right or left adnexal mass present.     Right adnexa not tender.     Left adnexa not tender.  Neck:     Musculoskeletal: Normal range of motion.     Thyroid: No thyromegaly.  Cardiovascular:     Rate and Rhythm: Normal rate and regular rhythm.     Heart sounds: Murmur present. Systolic murmur present with a grade of 3/6.  Pulmonary:     Effort: Pulmonary effort is normal.     Breath sounds: Normal breath sounds.  Chest:     Breasts:        Right: No mass, nipple discharge, skin change or tenderness.        Left: No mass, nipple discharge, skin change or tenderness.  Abdominal:     Palpations: Abdomen is  soft.     Tenderness: There is no abdominal tenderness. There is no guarding.  Musculoskeletal: Normal range of motion.  Neurological:     General: No focal deficit present.     Mental Status: She is alert and oriented to person, place, and time.     Cranial Nerves: No cranial nerve deficit.  Skin:    General: Skin is warm and dry.  Psychiatric:        Mood and Affect: Mood normal.        Behavior: Behavior normal.        Thought Content: Thought content normal.        Judgment: Judgment normal.  Vitals signs reviewed.     Assessment/Plan: Encounter for annual routine gynecological examination  History of abnormal cervical Pap smear--repeat pap due after 3 yrs due to ASCCP.  Encounter for surveillance of contraceptive pills - Plan: Drospirenone (SLYND) 4 MG TABS; change to slynd for better cycle control with POPs. Rx eRxd to [facility]/coupon card given. May need to fill at local pharm for coupon card. 2 samples given. F/u prn.   Encounter for screening mammogram for malignant neoplasm of breast; pt current on mammo  Family history of breast cancer--Vistaseq neg.   Family history of ovarian cancer  Meds ordered this encounter  Medications  . Drospirenone (SLYND) 4 MG TABS    Sig: Take 1 tablet by mouth daily.    Dispense:  84 tablet    Refill:  3    Pt has coupon card.    Order Specific Question:   Supervising Provider    Answer:   Gae Dry [[id number]]  GYN counsel breast self exam, mammography screening, adequate intake of calcium and vitamin D     F/U  Return in about 1 year (around 08/22/2020).  Alicia B. Copland, PA-C 08/23/2019 2:46 PM

## 2019-08-23 ENCOUNTER — Encounter: Payer: Self-pay | Admitting: Obstetrics and Gynecology

## 2019-08-23 ENCOUNTER — Other Ambulatory Visit: Payer: Self-pay

## 2019-08-23 ENCOUNTER — Ambulatory Visit (INDEPENDENT_AMBULATORY_CARE_PROVIDER_SITE_OTHER): Payer: Managed Care, Other (non HMO) | Admitting: Obstetrics and Gynecology

## 2019-08-23 VITALS — BP 120/86 | Ht 68.0 in | Wt 156.0 lb

## 2019-08-23 DIAGNOSIS — Z01419 Encounter for gynecological examination (general) (routine) without abnormal findings: Secondary | ICD-10-CM

## 2019-08-23 DIAGNOSIS — Z8041 Family history of malignant neoplasm of ovary: Secondary | ICD-10-CM

## 2019-08-23 DIAGNOSIS — Z3041 Encounter for surveillance of contraceptive pills: Secondary | ICD-10-CM

## 2019-08-23 DIAGNOSIS — Z1231 Encounter for screening mammogram for malignant neoplasm of breast: Secondary | ICD-10-CM

## 2019-08-23 DIAGNOSIS — Z803 Family history of malignant neoplasm of breast: Secondary | ICD-10-CM

## 2019-08-23 DIAGNOSIS — Z8742 Personal history of other diseases of the female genital tract: Secondary | ICD-10-CM | POA: Insufficient documentation

## 2019-08-23 MED ORDER — SLYND 4 MG PO TABS: 1.0000 | ORAL_TABLET | Freq: Every day | ORAL | 3 refills | Status: DC

## 2019-08-23 NOTE — Telephone Encounter (Signed)
Called [employer] and the change doesn't go into effect until 10/13/2019 so they couldn't do a PA/exceptions on the [place], they advised to call back in the beginning of the year and start the process over again once it's officially not covered. Pt notified and she will call me in Jan to start the process

## 2019-08-23 NOTE — Patient Instructions (Signed)
I value your feedback and entrusting us with your care. If you get a [facility] patient survey, I would appreciate you taking the time to let us know about your experience today. Thank you! 

## 2019-08-28 ENCOUNTER — Telehealth: Payer: Self-pay

## 2019-08-28 NOTE — Telephone Encounter (Signed)
Pt aware of PA denied and to use coupon card given by [facility] for around $25 49-month supply.

## 2019-08-31 ENCOUNTER — Other Ambulatory Visit: Payer: Self-pay | Admitting: Obstetrics and Gynecology

## 2019-08-31 DIAGNOSIS — Z3041 Encounter for surveillance of contraceptive pills: Secondary | ICD-10-CM

## 2019-09-01 ENCOUNTER — Ambulatory Visit: Payer: Managed Care, Other (non HMO) | Admitting: Cardiovascular Disease

## 2019-09-16 ENCOUNTER — Other Ambulatory Visit: Payer: Self-pay | Admitting: Obstetrics and Gynecology

## 2019-09-16 DIAGNOSIS — Z3041 Encounter for surveillance of contraceptive pills: Secondary | ICD-10-CM

## 2019-10-31 ENCOUNTER — Other Ambulatory Visit: Payer: Self-pay | Admitting: Cardiovascular Disease

## 2019-11-01 ENCOUNTER — Telehealth: Payer: Self-pay | Admitting: *Deleted

## 2019-11-01 ENCOUNTER — Other Ambulatory Visit: Payer: Self-pay | Admitting: Obstetrics and Gynecology

## 2019-11-01 DIAGNOSIS — Z3041 Encounter for surveillance of contraceptive pills: Secondary | ICD-10-CM

## 2019-11-01 MED ORDER — NORETHINDRONE 0.35 MG PO TABS: 1.0000 | ORAL_TABLET | Freq: Every day | ORAL | 3 refills | Status: DC

## 2019-11-01 MED ORDER — METOPROLOL SUCCINATE ER 50 MG PO TB24: ORAL_TABLET | ORAL | 2 refills | Status: DC

## 2019-11-01 NOTE — Progress Notes (Signed)
Rx change back to camila from slynd due to coupon card not working at [employer], per pt pref. F/u prn.

## 2019-11-01 NOTE — Telephone Encounter (Signed)
Received fax requesting info to start PA on pt's Forfivo XL, in Dr. Royden Purl inbox to review, not sure what diagnosis codes to use for med so will have PCP review

## 2019-11-01 NOTE — Telephone Encounter (Signed)
In IN box 

## 2019-11-01 NOTE — Telephone Encounter (Signed)
Pt aware of using coupon card at local pharmacy or switch pill to generic. She would like Rx sent to [facility] for generic pill (mentioned what she was on before).

## 2019-11-01 NOTE — Telephone Encounter (Addendum)
PA was approved. Pharmacy notified. Letter placed in inbox for Dr. Milinda Antis to sign and send for scanning

## 2019-11-01 NOTE — Telephone Encounter (Signed)
Form faxed

## 2020-04-10 ENCOUNTER — Other Ambulatory Visit: Payer: Self-pay | Admitting: Family Medicine

## 2020-06-12 ENCOUNTER — Telehealth: Payer: Self-pay | Admitting: Family Medicine

## 2020-06-12 DIAGNOSIS — R7309 Other abnormal glucose: Secondary | ICD-10-CM

## 2020-06-12 DIAGNOSIS — E7800 Pure hypercholesterolemia, unspecified: Secondary | ICD-10-CM

## 2020-06-12 DIAGNOSIS — Z0000 Encounter for general adult medical examination without abnormal findings: Secondary | ICD-10-CM

## 2020-06-12 NOTE — Telephone Encounter (Signed)
Orders released and copy mailed to pt. Pt notified

## 2020-06-12 NOTE — Telephone Encounter (Signed)
I ordered future [facility] lab collect  If able- release and mail her paper copy  Thanks

## 2020-06-12 NOTE — Telephone Encounter (Signed)
Patient messaged via mychart and would like to have lab orders for cpe labs sent to a [facility]. Please advise.

## 2020-06-19 ENCOUNTER — Other Ambulatory Visit: Payer: Self-pay | Admitting: Cardiovascular Disease

## 2020-06-20 LAB — CBC WITH DIFFERENTIAL/PLATELET
Basophils Absolute: 0 10*3/uL (ref 0.0–0.2)
Basos: 1 %
EOS (ABSOLUTE): 0.2 10*3/uL (ref 0.0–0.4)
Eos: 4 %
Hematocrit: 36.5 % (ref 34.0–46.6)
Hemoglobin: 11.9 g/dL (ref 11.1–15.9)
Immature Grans (Abs): 0 10*3/uL (ref 0.0–0.1)
Immature Granulocytes: 0 %
Lymphocytes Absolute: 2 10*3/uL (ref 0.7–3.1)
Lymphs: 30 %
MCH: 27.9 pg (ref 26.6–33.0)
MCHC: 32.6 g/dL (ref 31.5–35.7)
MCV: 86 fL (ref 79–97)
Monocytes Absolute: 0.7 10*3/uL (ref 0.1–0.9)
Monocytes: 11 %
Neutrophils Absolute: 3.8 10*3/uL (ref 1.4–7.0)
Neutrophils: 54 %
Platelets: 314 10*3/uL (ref 150–450)
RBC: 4.27 x10E6/uL (ref 3.77–5.28)
RDW: 13.3 % (ref 11.7–15.4)
WBC: 6.8 10*3/uL (ref 3.4–10.8)

## 2020-06-20 LAB — COMPREHENSIVE METABOLIC PANEL
ALT: 12 IU/L (ref 0–32)
AST: 19 IU/L (ref 0–40)
Albumin/Globulin Ratio: 1.5 (ref 1.2–2.2)
Albumin: 4.3 g/dL (ref 3.8–4.9)
Alkaline Phosphatase: 61 IU/L (ref 48–121)
BUN/Creatinine Ratio: 9 (ref 9–23)
BUN: 9 mg/dL (ref 6–24)
Bilirubin Total: 0.6 mg/dL (ref 0.0–1.2)
CO2: 22 mmol/L (ref 20–29)
Calcium: 9.6 mg/dL (ref 8.7–10.2)
Chloride: 103 mmol/L (ref 96–106)
Creatinine, Ser: 0.97 mg/dL (ref 0.57–1.00)
GFR calc Af Amer: 78 mL/min/{1.73_m2} (ref >59)
GFR calc non Af Amer: 68 mL/min/{1.73_m2} (ref >59)
Globulin, Total: 2.8 g/dL (ref 1.5–4.5)
Glucose: 94 mg/dL (ref 65–99)
Potassium: 4.9 mmol/L (ref 3.5–5.2)
Sodium: 139 mmol/L (ref 134–144)
Total Protein: 7.1 g/dL (ref 6.0–8.5)

## 2020-06-20 LAB — HEMOGLOBIN A1C
Est. average glucose Bld gHb Est-mCnc: 111 mg/dL
Hgb A1c MFr Bld: 5.5 % (ref 4.8–5.6)

## 2020-06-20 LAB — LIPID PANEL
Chol/HDL Ratio: 3.3 ratio (ref 0.0–4.4)
Cholesterol, Total: 202 mg/dL — ABNORMAL HIGH (ref 100–199)
HDL: 62 mg/dL (ref >39)
LDL Chol Calc (NIH): 130 mg/dL — ABNORMAL HIGH (ref 0–99)
Triglycerides: 56 mg/dL (ref 0–149)
VLDL Cholesterol Cal: 10 mg/dL (ref 5–40)

## 2020-06-20 LAB — TSH: TSH: 2.71 u[IU]/mL (ref 0.450–4.500)

## 2020-06-21 ENCOUNTER — Other Ambulatory Visit: Payer: Managed Care, Other (non HMO)

## 2020-06-26 ENCOUNTER — Encounter: Payer: Self-pay | Admitting: Family Medicine

## 2020-06-26 ENCOUNTER — Other Ambulatory Visit: Payer: Self-pay

## 2020-06-26 ENCOUNTER — Ambulatory Visit (INDEPENDENT_AMBULATORY_CARE_PROVIDER_SITE_OTHER): Payer: Managed Care, Other (non HMO) | Admitting: Family Medicine

## 2020-06-26 ENCOUNTER — Encounter: Payer: Managed Care, Other (non HMO) | Admitting: Family Medicine

## 2020-06-26 VITALS — BP 132/75 | HR 77 | Temp 97.5°F | Ht 67.5 in | Wt 156.4 lb

## 2020-06-26 DIAGNOSIS — F341 Dysthymic disorder: Secondary | ICD-10-CM

## 2020-06-26 DIAGNOSIS — N390 Urinary tract infection, site not specified: Secondary | ICD-10-CM | POA: Insufficient documentation

## 2020-06-26 DIAGNOSIS — Z0000 Encounter for general adult medical examination without abnormal findings: Secondary | ICD-10-CM | POA: Diagnosis not present

## 2020-06-26 DIAGNOSIS — I712 Thoracic aortic aneurysm, without rupture, unspecified: Secondary | ICD-10-CM

## 2020-06-26 DIAGNOSIS — R7309 Other abnormal glucose: Secondary | ICD-10-CM | POA: Diagnosis not present

## 2020-06-26 DIAGNOSIS — E7800 Pure hypercholesterolemia, unspecified: Secondary | ICD-10-CM

## 2020-06-26 MED ORDER — FORFIVO XL 450 MG PO TB24
1.0000 | ORAL_TABLET | Freq: Every day | ORAL | 3 refills | Status: DC
Start: 2020-06-26 — End: 2021-07-09

## 2020-06-26 NOTE — Assessment & Plan Note (Signed)
Reviewed health habits including diet and exercise and skin cancer prevention Reviewed appropriate screening tests for age  Also reviewed health mt list, fam hx and immunization status , as well as social and family history   See HPI Labs reviewed  Pt plans to get flu shot at pharmacy with her son Urine culture sent for freq utis  covid immunized  Mammogram is due in oct-pt plans to schedule  Also planning annual gyn visit next month(pap nl in 2019)

## 2020-06-26 NOTE — Assessment & Plan Note (Signed)
Disc goals for lipids and reasons to control them Rev last labs with pt Rev low sat fat diet in detail LDL is down to 130  Enc her to discuss goals with cardiology  Thinks she can improve diet  Would like to avoid medication  Good HDL
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
## 2020-06-26 NOTE — Assessment & Plan Note (Signed)
Continues regular cardiology checks Doing well with metoprolol xl

## 2020-06-26 NOTE — Assessment & Plan Note (Signed)
At least 4 in the past 6 mo  Tend to occur after sexual activity  Disc ways to prevent them  Has used internet provider so no cultures done Last tx macrobid  Still has symptoms  Sent urine culture today and tx accordingly  Enc good water intake  May need to consider urology eval in the future

## 2020-06-26 NOTE — Patient Instructions (Addendum)
We can set you up with urology if needed for utis   Make sure to get a flu shot  Also mammogram in October   Keep eating healthy Talk to your cardiologist about cholesterol goals  Avoid red meat/ fried foods/ egg yolks/ fatty breakfast meats/ butter, cheese and high fat dairy/ and shellfish    Leave a urine sample on the way out

## 2020-06-26 NOTE — Assessment & Plan Note (Signed)
Continues regular cardiology f/u

## 2020-06-26 NOTE — Assessment & Plan Note (Signed)
Pt continues to do well as long as she continues forfivo xl  Reviewed stressors/ coping techniques/symptoms/ support sources/ tx options and side effects in detail today  Stress is always high

## 2020-06-26 NOTE — Assessment & Plan Note (Signed)
Good diet and exercise habits Lab Results  Component Value Date   HGBA1C 5.5 06/19/2020   disc imp of low glycemic diet and wt loss to prevent DM2

## 2020-06-28 LAB — URINE CULTURE
MICRO NUMBER:: 10953900
SPECIMEN QUALITY:: ADEQUATE

## 2020-06-29 ENCOUNTER — Other Ambulatory Visit: Payer: Self-pay | Admitting: Family Medicine

## 2020-06-29 MED ORDER — SULFAMETHOXAZOLE-TRIMETHOPRIM 800-160 MG PO TABS: 1.0000 | ORAL_TABLET | Freq: Two times a day (BID) | ORAL | 0 refills | Status: DC

## 2020-06-29 NOTE — Telephone Encounter (Signed)
Spoke to pt. Sent to [address]

## 2020-06-29 NOTE — Telephone Encounter (Signed)
Urine grew out e coli I would like to treat with bactrim ds this time for 5 d Pended for pharmacy of choice

## 2020-07-10 ENCOUNTER — Telehealth: Payer: Self-pay

## 2020-07-10 NOTE — Telephone Encounter (Signed)
Pt had a Urine Culture done at recent OV and it went to [facility] instead of [facility]. Pt is asking why that was done when it should be in her notes that her labs should go to [facility]. Please call her back at [phone].

## 2020-07-18 NOTE — Telephone Encounter (Signed)
I am very sorry to hear that this has occurred; however, we do not have a way of flagging this in the system.  As such,it is the patient's responsibility to communicate with the medical assistant and/or lab person whenever any type of lab is being performed if they require it to be sent to a special lab such as [facility], We can ask billing for review on this but, as it is our policy for the patient to be responsible for this and the service was performed, I am not sure that there is anything we can do to waive this.   Please apologize but inform patient of the process.    Thanks.

## 2020-07-18 NOTE — Telephone Encounter (Signed)
I called and discussed with patient. I asked if she had advised the lab to send to [facility] but she states she did not have a chance to tell them. She states she has never had a problem in the past with them being sent to [facility]. Is there anything we can do to fix this?

## 2020-07-25 ENCOUNTER — Other Ambulatory Visit: Payer: Self-pay | Admitting: Family Medicine

## 2020-07-25 DIAGNOSIS — Z1231 Encounter for screening mammogram for malignant neoplasm of breast: Secondary | ICD-10-CM

## 2020-08-22 ENCOUNTER — Other Ambulatory Visit: Payer: Self-pay

## 2020-08-22 ENCOUNTER — Ambulatory Visit
Admission: RE | Admit: 2020-08-22 | Discharge: 2020-08-22 | Disposition: A | Discharge status: Home / Self Care | Payer: Managed Care, Other (non HMO) | Source: Ambulatory Visit | Attending: Family Medicine | Admitting: Family Medicine

## 2020-08-22 DIAGNOSIS — Z1231 Encounter for screening mammogram for malignant neoplasm of breast: Secondary | ICD-10-CM
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
## 2020-09-17 ENCOUNTER — Ambulatory Visit: Payer: Managed Care, Other (non HMO) | Admitting: Cardiovascular Disease

## 2020-09-17 ENCOUNTER — Other Ambulatory Visit: Payer: Self-pay

## 2020-09-17 ENCOUNTER — Encounter: Payer: Self-pay | Admitting: Cardiovascular Disease

## 2020-09-17 VITALS — BP 114/64 | HR 58 | Ht 68.0 in | Wt 159.0 lb

## 2020-09-17 DIAGNOSIS — Q231 Congenital insufficiency of aortic valve: Secondary | ICD-10-CM

## 2020-09-17 DIAGNOSIS — I359 Nonrheumatic aortic valve disorder, unspecified: Secondary | ICD-10-CM | POA: Diagnosis not present

## 2020-09-17 NOTE — Patient Instructions (Addendum)

## 2020-11-03 ENCOUNTER — Other Ambulatory Visit: Payer: Self-pay | Admitting: Obstetrics and Gynecology

## 2020-11-03 DIAGNOSIS — Z3041 Encounter for surveillance of contraceptive pills: Secondary | ICD-10-CM

## 2020-11-19 ENCOUNTER — Other Ambulatory Visit: Payer: Self-pay | Admitting: Obstetrics and Gynecology

## 2020-11-19 DIAGNOSIS — Z3041 Encounter for surveillance of contraceptive pills: Secondary | ICD-10-CM

## 2021-01-30 ENCOUNTER — Other Ambulatory Visit: Payer: Self-pay | Admitting: Cardiovascular Disease

## 2021-01-30 ENCOUNTER — Other Ambulatory Visit: Payer: Self-pay | Admitting: Obstetrics and Gynecology

## 2021-01-30 DIAGNOSIS — Z3041 Encounter for surveillance of contraceptive pills: Secondary | ICD-10-CM

## 2021-02-10 NOTE — Progress Notes (Signed)
PCP:  Abner Greenspan, MD   Chief Complaint  Patient presents with  . Gynecologic Exam    No concerns  . [facility] Employee     HPI:      [name] is a 50 y.o. [id number] who LMP was No LMP recorded. Patient is perimenopausal., presents today for her annual examination.  Her menses are irregular with POPs, Q2-6 wks, lasting 3-10 days, light flow to mod flow,  some months are heavier. Was on OCPs with improved cycle and flow but changed to camila POPs in past due to HTN. Has vasomotor sx. Tried to change to slynd last yr but not affordable through [place].   Sex activity: single partner, contraception - POPs.  Last Pap: 07/06/18 Results were no abnormalities/neg HPV DNA. 05/2017 with ASCUS /POS HPV DNA. Colpo with neg bx 07/08/17. Previous colpo 04/26/15 with neg bx after ASCUS/POS HPV DNA pap 4/16. Repeat pap after 3 yrs (2022) per [facility]. Hx of STDs: HPV  Last mammogram: 08/22/20 Results were: normal--routine follow-up in 12 months. Hx of LT breast cyst and fibrocystic breasts.  There is a FH of breast cancer in her MGM. There is a FH of ovarian cancer in her PGM. Pt tested BRCA neg 2014 through [place] 2014=17%. Neg Vistaseq update testing done 2019 with [facility]. The patient does not do self-breast exams.  Tobacco use: The patient denies current or previous tobacco use. Alcohol use: social drinker No drug use.  Exercise: moderately active  She does get adequate calcium and Vitamin D in her diet.  Colonoscopy: 11/20 with polyps; repeat due after 3 yrs per pt. Done at [facility] in [place].  Labs with PCP. On metoprolol for HTN issues after aortic valve repair surgery 2015.    Past Medical History:  Diagnosis Date  . Aortic root dilation (HCC)   . Colon polyp 2020  . Congenital heart disease    bicuspid aortic valve with coarctation repair 04/1974  . Depression   . Edema   . Fibrocystic breast   . GERD (gastroesophageal reflux disease)    takes Zantac daily as needed  .  HPV (human papilloma virus) infection   . Seizures ([place])    as a baby and only 1 time d/t high fever  . Shortness of breath dyspnea    with exertion  . Sinus infection    Nov 16 and treated with antibiotic  . TMJ (dislocation of temporomandibular joint)    Wears a night gear   . UTI (lower urinary tract infection)    hx of   . Yeast infection    08/17/14    Past Surgical History:  Procedure Laterality Date  . ASCENDING AORTIC ROOT REPLACEMENT N/A 09/03/2014   Procedure: ASCENDING AORTIC ROOT REPLACEMENT & VALVE SPARING ROOT;  Surgeon: Gaye Pollack, MD;  Location: [facility];  Service: Open Heart Surgery;  Laterality: N/A;  [facility] ARREST  . COARCTATION OF AORTA EXCISION  1975  . COLONOSCOPY  08/2019   [facility] in [place], repeat after 5 yrs  . COLPOSCOPY  04/26/2015  . CRYOTHERAPY  2009  . EYE SURGERY    . EYE SURGERY Bilateral 1985  . INTRAOPERATIVE TRANSESOPHAGEAL ECHOCARDIOGRAM N/A 09/03/2014   Procedure: INTRAOPERATIVE TRANSESOPHAGEAL ECHOCARDIOGRAM;  Surgeon: Gaye Pollack, MD;  Location: [facility] OR;  Service: Open Heart Surgery;  Laterality: N/A;  . LEFT HEART CATHETERIZATION WITH CORONARY ANGIOGRAM N/A 08/06/2014   Procedure: LEFT HEART CATHETERIZATION WITH CORONARY ANGIOGRAM;  Surgeon: Wallis Bamberg  Johnsie Cancel, MD;  Location: [facility] CATH LAB;  Service: Cardiovascular;  Laterality: N/A;  . TEE WITHOUT CARDIOVERSION N/A 08/06/2014   Procedure: TRANSESOPHAGEAL ECHOCARDIOGRAM (TEE);  Surgeon: Josue Hector, MD;  Location: [facility] ENDOSCOPY;  Service: Cardiovascular;  Laterality: N/A;    Family History  Problem Relation Age of Onset  . Hypertension Father   . Diabetes Father        DM type 2  . Cancer Father        lung   . Breast cancer Maternal Grandmother        Malignant  . Leukemia Maternal Grandmother   . Ovarian cancer Paternal Grandmother        45s, malignant  . Celiac disease Son   . Down syndrome Son     Social History   Socioeconomic History  . Marital status: Divorced    Spouse  name: Not on file  . Number of children: 2  . Years of education: Not on file  . Highest education level: Not on file  Occupational History  . Not on file  Tobacco Use  . Smoking status: Never Smoker  . Smokeless tobacco: Never Used  Vaping Use  . Vaping Use: Never used  Substance and Sexual Activity  . Alcohol use: Yes    Alcohol/week: 0.0 standard drinks    Comment: occasional  . Drug use: No  . Sexual activity: Yes    Birth control/protection: Pill  Other Topics Concern  . Not on file  Social History Narrative  . Not on file   Social Determinants of Health   Financial Resource Strain: Not on file  Food Insecurity: Not on file  Transportation Needs: Not on file  Physical Activity: Not on file  Stress: Not on file  Social Connections: Not on file  Intimate Partner Violence: Not on file    Current Meds  Medication Sig  . Biotin w/ Vitamins C & E (HAIR SKIN & NAILS GUMMIES PO) Take 1 tablet by mouth daily.  Marland Kitchen CALCIUM-VITAMIN D PO Take 2 tablets by mouth daily.  . Drospirenone (SLYND) 4 MG TABS Take 1 tablet by mouth daily.  . FORFIVO XL 450 MG TB24 Take 1 tablet by mouth daily.  . metoprolol succinate (TOPROL-XL) 50 MG 24 hr tablet TAKE 1 TABLET BY MOUTH  DAILY WITH OR IMMEDIATELY  FOLLOWING A MEAL  . omeprazole (PRILOSEC) 20 MG capsule Take 20 mg by mouth as needed.   . [DISCONTINUED] norethindrone (MICRONOR) 0.35 MG tablet TAKE 1 TABLET BY MOUTH  DAILY     ROS:  Review of Systems  Constitutional: Negative for fatigue, fever and unexpected weight change.  Respiratory: Negative for cough, shortness of breath and wheezing.   Cardiovascular: Negative for chest pain, palpitations and leg swelling.  Gastrointestinal: Negative for blood in stool, constipation, diarrhea, nausea and vomiting.  Endocrine: Positive for heat intolerance. Negative for cold intolerance and polyuria.  Genitourinary: Negative for dyspareunia, dysuria, flank pain, frequency, genital sores,  hematuria, menstrual problem, pelvic pain, urgency, vaginal bleeding, vaginal discharge and vaginal pain.  Musculoskeletal: Negative for back pain, joint swelling and myalgias.  Skin: Negative for rash.  Neurological: Negative for dizziness, syncope, light-headedness, numbness and headaches.  Hematological: Negative for adenopathy.  Psychiatric/Behavioral: Positive for dysphoric mood. Negative for agitation, confusion, sleep disturbance and suicidal ideas. The patient is not nervous/anxious.      Objective: BP 124/70   Ht '5\' 8"'  (1.727 m)   Wt 159 lb (72.1 kg)   BMI 24.18 kg/m  Physical Exam Constitutional:      Appearance: She is well-developed.  Genitourinary:     Vulva normal.     Right Labia: No rash, tenderness or lesions.    Left Labia: No tenderness, lesions or rash.    No vaginal discharge, erythema or tenderness.      Right Adnexa: not tender and no mass present.    Left Adnexa: not tender and no mass present.    No cervical motion tenderness, friability or polyp.     Uterus is not enlarged or tender.  Breasts:     Right: No mass, nipple discharge, skin change or tenderness.     Left: No mass, nipple discharge, skin change or tenderness.    Neck:     Thyroid: No thyromegaly.  Cardiovascular:     Rate and Rhythm: Normal rate and regular rhythm.     Heart sounds: Murmur heard.   Systolic murmur is present with a grade of 3/6.   Pulmonary:     Effort: Pulmonary effort is normal.     Breath sounds: Normal breath sounds.  Abdominal:     Palpations: Abdomen is soft.     Tenderness: There is no abdominal tenderness. There is no guarding or rebound.  Musculoskeletal:        General: Normal range of motion.     Cervical back: Normal range of motion.  Lymphadenopathy:     Cervical: No cervical adenopathy.  Neurological:     General: No focal deficit present.     Mental Status: She is alert and oriented to person, place, and time.     Cranial Nerves: No cranial  nerve deficit.  Skin:    General: Skin is warm and dry.  Psychiatric:        Mood and Affect: Mood normal.        Behavior: Behavior normal.        Thought Content: Thought content normal.        Judgment: Judgment normal.  Vitals reviewed.     Assessment/Plan: Encounter for annual routine gynecological examination  Cervical cancer screening - Plan: IGP, Aptima HPV  Screening for HPV (human papillomavirus) - Plan: IGP, Aptima HPV  History of abnormal cervical Pap smear - Plan: IGP, Aptima HPV  Encounter for surveillance of contraceptive pills - Plan: Drospirenone (SLYND) 4 MG TABS, 1 sample slynd, coupon card given to pt. Rx eRxd. See if improves BTB. Will try again this yr for coverage if we can do PA due to BTB with camila.  Breakthrough bleeding on OCPs  Encounter for screening mammogram for malignant neoplasm of breast - Plan: MM 3D SCREEN BREAST BILATERAL; pt to sched mammo  Family history of breast cancer--Vistaseq neg. No increased screening recommendations at this time.   Family history of ovarian cancer    Meds ordered this encounter  Medications  . Drospirenone (SLYND) 4 MG TABS    Sig: Take 1 tablet by mouth daily.    Dispense:  84 tablet    Refill:  3    Order Specific Question:   Supervising Provider    Answer:   Gae Dry [[id number]]             GYN counsel breast self exam, mammography screening, adequate intake of calcium and vitamin D     F/U  Return in about 1 year (around 02/11/2022).  Ajani Rineer B. Joaovictor Krone, PA-C 02/11/2021 2:53 PM

## 2021-02-11 ENCOUNTER — Other Ambulatory Visit: Payer: Self-pay

## 2021-02-11 ENCOUNTER — Ambulatory Visit (INDEPENDENT_AMBULATORY_CARE_PROVIDER_SITE_OTHER): Payer: Managed Care, Other (non HMO) | Admitting: Obstetrics and Gynecology

## 2021-02-11 ENCOUNTER — Encounter: Payer: Self-pay | Admitting: Obstetrics and Gynecology

## 2021-02-11 VITALS — BP 124/70 | Ht 68.0 in | Wt 159.0 lb

## 2021-02-11 DIAGNOSIS — Z124 Encounter for screening for malignant neoplasm of cervix: Secondary | ICD-10-CM

## 2021-02-11 DIAGNOSIS — Z1151 Encounter for screening for human papillomavirus (HPV): Secondary | ICD-10-CM | POA: Diagnosis not present

## 2021-02-11 DIAGNOSIS — Z01419 Encounter for gynecological examination (general) (routine) without abnormal findings: Secondary | ICD-10-CM

## 2021-02-11 DIAGNOSIS — Z803 Family history of malignant neoplasm of breast: Secondary | ICD-10-CM

## 2021-02-11 DIAGNOSIS — Z8041 Family history of malignant neoplasm of ovary: Secondary | ICD-10-CM

## 2021-02-11 DIAGNOSIS — N921 Excessive and frequent menstruation with irregular cycle: Secondary | ICD-10-CM

## 2021-02-11 DIAGNOSIS — Z8742 Personal history of other diseases of the female genital tract: Secondary | ICD-10-CM | POA: Diagnosis not present

## 2021-02-11 DIAGNOSIS — Z3041 Encounter for surveillance of contraceptive pills: Secondary | ICD-10-CM

## 2021-02-11 DIAGNOSIS — Z1231 Encounter for screening mammogram for malignant neoplasm of breast: Secondary | ICD-10-CM

## 2021-02-11 MED ORDER — SLYND 4 MG PO TABS: 1.0000 | ORAL_TABLET | Freq: Every day | ORAL | 3 refills | Status: DC

## 2021-02-11 NOTE — Patient Instructions (Addendum)
I value your feedback and you entrusting us with your care. If you get a [facility] patient survey, I would appreciate you taking the time to let us know about your experience today. Thank you!  [facility]: [phone]      

## 2021-02-16 LAB — IGP, APTIMA HPV: HPV Aptima: NEGATIVE

## 2021-02-23 DIAGNOSIS — S60221A Contusion of right hand, initial encounter: Secondary | ICD-10-CM | POA: Insufficient documentation

## 2021-03-03 ENCOUNTER — Other Ambulatory Visit: Payer: Self-pay | Admitting: Obstetrics and Gynecology

## 2021-03-03 ENCOUNTER — Encounter: Payer: Self-pay | Admitting: Obstetrics and Gynecology

## 2021-03-03 DIAGNOSIS — Z3041 Encounter for surveillance of contraceptive pills: Secondary | ICD-10-CM

## 2021-03-03 MED ORDER — SLYND 4 MG PO TABS: 1.0000 | ORAL_TABLET | Freq: Every day | ORAL | 3 refills | Status: DC

## 2021-03-03 NOTE — Progress Notes (Signed)
Rx RF to [facility] instead of [facility]

## 2021-03-05 NOTE — Telephone Encounter (Signed)
PA submitted. Waiting for response:

## 2021-05-12 ENCOUNTER — Telehealth: Payer: Self-pay

## 2021-05-12 NOTE — Telephone Encounter (Signed)
Pt left v/m that ins co no longer covers Forfivo XL due to cost and pt request an exception form be completed so pt can continue taking the Forfivo. Sending note to [place] CMA.

## 2021-05-13 ENCOUNTER — Telehealth: Payer: Self-pay | Admitting: *Deleted

## 2021-05-13 NOTE — Telephone Encounter (Signed)
Tylenol-pain or fever  Mucinex dm for congested cough  Fluids Nasal saline for congestion  Keep [place] posted

## 2021-05-13 NOTE — Telephone Encounter (Signed)
Patient called stating that she tested positive for covid last night. Patient stated that she was exposed at a conference last week in [place]. Patient stated that she started with symptoms Friday. Patient stated that she has had a productive cough/clear-yellow, sneezing, runny nose fever and sinus pressure. Patient stated that she does not feel that she needs the antiviral medication. Patient stated that she was calling to see what she can take OTC medications for her symptoms. Patient was wondering if she could have covid and a sinus infection at the same time. Patient was offered a virtual visit which she declined. Patient stated that she just wants to know what OTC medications she can take. Patient was advised to rest, drink lots of fluids and eat well balanced meals. Patient was given CDC quarantine guidelines. Patient was given ER precautions and she verbalized understanding.

## 2021-05-13 NOTE — Telephone Encounter (Signed)
Pt notified of Dr. Tower's comments and recommendations and verbalized understanding  

## 2021-05-21 ENCOUNTER — Telehealth: Payer: Self-pay | Admitting: Family Medicine

## 2021-05-21 DIAGNOSIS — R7309 Other abnormal glucose: Secondary | ICD-10-CM

## 2021-05-21 DIAGNOSIS — E7800 Pure hypercholesterolemia, unspecified: Secondary | ICD-10-CM

## 2021-05-21 DIAGNOSIS — Z0000 Encounter for general adult medical examination without abnormal findings: Secondary | ICD-10-CM

## 2021-05-21 NOTE — Telephone Encounter (Signed)
Called pt to r/s appointment. Pt wants to have lab order to be sent to [facility] because it is free

## 2021-05-23 NOTE — Telephone Encounter (Signed)
Pt's CPE is in Sept, please order labs for [facility] so we can release then in their sxs

## 2021-05-25 NOTE — Telephone Encounter (Signed)
Done

## 2021-05-26 NOTE — Telephone Encounter (Signed)
Labs released and mailed to pt.

## 2021-05-27 ENCOUNTER — Telehealth: Payer: Self-pay

## 2021-05-27 NOTE — Telephone Encounter (Signed)
Thanks for making me aware- we can discuss at her appt upcoming. I may not be able to take action of the problems that day due to insurance stipulations (since it is a health mt visit)  but we can start the discussion.

## 2021-05-27 NOTE — Telephone Encounter (Signed)
Elizabeth Hayden front office mgr brought note that pt was scheduled for CPX on 07/01/21 with other notations of occasional vertigo, occasional numbness in toe and possible arthritis in lt hand. Left v/m at home and cell for pt to call [facility] for more info. Sending note to lsc triage.

## 2021-05-27 NOTE — Telephone Encounter (Signed)
Pt called back; pt said numbness of toe is just on one side of toe and comes and goes and does not last long and has not had numbness for about 1 wk. Pt said vertigo episodes are just for few mins and last short episode of vertigo was 3 - 4 wks ago. Pt said she is in no distress and UC & ED precautions given and pt voiced understanding. Pt appreciated the call but just added those symptoms so could discuss at her appt. Sending FYI to Dr Milinda Antis.

## 2021-05-27 NOTE — Telephone Encounter (Signed)
Had to wait until it was due to be refilled so we could get the correct ppw, due to [place] not having correct insurance info on file. Form received from pharmacy and completed and faxed back to [facility], I will await a response

## 2021-05-27 NOTE — Telephone Encounter (Signed)
Pt called back returning your call °

## 2021-05-28 NOTE — Telephone Encounter (Signed)
Faxed denial letter received, I highlighted where it said she had to try and fail certain meds in order to get this covered. Also I highlighted the part where it talks about an appeal if PCP wants to write a letter. Please review fax, placed in your inbox

## 2021-05-28 NOTE — Telephone Encounter (Signed)
Patient left a voicemail stating that her insurance company has denied the PA on her Forfivo. Patient stated that an appeal needs to be done. Patient stated that the paperwork needs to be faxed to [phone] and phone number [phone]

## 2021-05-31 NOTE — Telephone Encounter (Signed)
I reviewed this.  Please ask what if any px for depression she has taken before. Unless she has failed 2 of their list it will not be covered. It is equivalent to three 150 mg buproprion however if she wants to try that we may be able to keep things the same.

## 2021-06-02 NOTE — Telephone Encounter (Signed)
Insurance has now "overturned" their decision and approved the forfivo for a year. Approval faxed placed in PCP's inbox to sign and send for scanning

## 2021-06-09 ENCOUNTER — Telehealth: Payer: Self-pay

## 2021-06-09 DIAGNOSIS — E7800 Pure hypercholesterolemia, unspecified: Secondary | ICD-10-CM

## 2021-06-09 DIAGNOSIS — Z0000 Encounter for general adult medical examination without abnormal findings: Secondary | ICD-10-CM

## 2021-06-09 DIAGNOSIS — R7309 Other abnormal glucose: Secondary | ICD-10-CM

## 2021-06-09 NOTE — Addendum Note (Signed)
Addended by: Roxy Manns A on: 06/09/2021 08:53 PM   Modules accepted: Orders

## 2021-06-09 NOTE — Telephone Encounter (Signed)
Patient called left message on voicemail. States she has CPE on 9/20. Orders are in for her to get labs done at [facility] before visit. Wanted to know if she can get her hormone levels checked. She has been having period less frequently and wanted to know if she is in menopause. Would like call back with response.

## 2021-06-09 NOTE — Telephone Encounter (Signed)
Future labs for [facility] draw done

## 2021-06-09 NOTE — Telephone Encounter (Signed)
Unless she is fully in menopause (no period in a year) the labs will not tell me anything.  If periods are irregular or changing then she is in perimenopause.  We can discuss it further at visit   Sending this back to myself to order the labs Thanks

## 2021-06-10 NOTE — Telephone Encounter (Signed)
Orders released into [facility]'s sxs. Pt notified of Dr. Royden Purl comments and verbalized understanding

## 2021-06-10 NOTE — Addendum Note (Signed)
Addended by: Shon Millet on: 06/10/2021 12:47 PM   Modules accepted: Orders

## 2021-06-21 LAB — CBC WITH DIFFERENTIAL/PLATELET
Basophils Absolute: 0 10*3/uL (ref 0.0–0.2)
Basos: 1 %
EOS (ABSOLUTE): 0.1 10*3/uL (ref 0.0–0.4)
Eos: 2 %
Hematocrit: 41.1 % (ref 34.0–46.6)
Hemoglobin: 13.7 g/dL (ref 11.1–15.9)
Immature Grans (Abs): 0 10*3/uL (ref 0.0–0.1)
Immature Granulocytes: 0 %
Lymphocytes Absolute: 1.7 10*3/uL (ref 0.7–3.1)
Lymphs: 31 %
MCH: 29.9 pg (ref 26.6–33.0)
MCHC: 33.3 g/dL (ref 31.5–35.7)
MCV: 90 fL (ref 79–97)
Monocytes Absolute: 0.5 10*3/uL (ref 0.1–0.9)
Monocytes: 10 %
Neutrophils Absolute: 3.1 10*3/uL (ref 1.4–7.0)
Neutrophils: 56 %
Platelets: 274 10*3/uL (ref 150–450)
RBC: 4.58 x10E6/uL (ref 3.77–5.28)
RDW: 13.3 % (ref 11.7–15.4)
WBC: 5.5 10*3/uL (ref 3.4–10.8)

## 2021-06-21 LAB — COMPREHENSIVE METABOLIC PANEL
ALT: 18 IU/L (ref 0–32)
AST: 18 IU/L (ref 0–40)
Albumin/Globulin Ratio: 1.9 (ref 1.2–2.2)
Albumin: 4.4 g/dL (ref 3.8–4.9)
Alkaline Phosphatase: 54 IU/L (ref 44–121)
BUN/Creatinine Ratio: 10 (ref 9–23)
BUN: 10 mg/dL (ref 6–24)
Bilirubin Total: 0.8 mg/dL (ref 0.0–1.2)
CO2: 23 mmol/L (ref 20–29)
Calcium: 9.7 mg/dL (ref 8.7–10.2)
Chloride: 102 mmol/L (ref 96–106)
Creatinine, Ser: 0.99 mg/dL (ref 0.57–1.00)
Globulin, Total: 2.3 g/dL (ref 1.5–4.5)
Glucose: 92 mg/dL (ref 65–99)
Potassium: 4.3 mmol/L (ref 3.5–5.2)
Sodium: 138 mmol/L (ref 134–144)
Total Protein: 6.7 g/dL (ref 6.0–8.5)
eGFR: 69 mL/min/{1.73_m2} (ref >59)

## 2021-06-21 LAB — LIPID PANEL
Chol/HDL Ratio: 4 ratio (ref 0.0–4.4)
Cholesterol, Total: 247 mg/dL — ABNORMAL HIGH (ref 100–199)
HDL: 62 mg/dL (ref >39)
LDL Chol Calc (NIH): 173 mg/dL — ABNORMAL HIGH (ref 0–99)
Triglycerides: 72 mg/dL (ref 0–149)
VLDL Cholesterol Cal: 12 mg/dL (ref 5–40)

## 2021-06-21 LAB — TSH: TSH: 2.34 u[IU]/mL (ref 0.450–4.500)

## 2021-06-21 LAB — HEMOGLOBIN A1C
Est. average glucose Bld gHb Est-mCnc: 117 mg/dL
Hgb A1c MFr Bld: 5.7 % — ABNORMAL HIGH (ref 4.8–5.6)

## 2021-06-23 ENCOUNTER — Other Ambulatory Visit: Payer: Managed Care, Other (non HMO)

## 2021-06-30 ENCOUNTER — Encounter: Payer: Managed Care, Other (non HMO) | Admitting: Family Medicine

## 2021-07-01 ENCOUNTER — Ambulatory Visit (INDEPENDENT_AMBULATORY_CARE_PROVIDER_SITE_OTHER): Payer: Managed Care, Other (non HMO) | Admitting: Family Medicine

## 2021-07-01 ENCOUNTER — Encounter: Payer: Self-pay | Admitting: Family Medicine

## 2021-07-01 ENCOUNTER — Other Ambulatory Visit: Payer: Self-pay

## 2021-07-01 VITALS — BP 132/78 | HR 71 | Temp 99.0°F | Ht 68.0 in | Wt 157.4 lb

## 2021-07-01 DIAGNOSIS — F341 Dysthymic disorder: Secondary | ICD-10-CM | POA: Diagnosis not present

## 2021-07-01 DIAGNOSIS — Z0000 Encounter for general adult medical examination without abnormal findings: Secondary | ICD-10-CM | POA: Diagnosis not present

## 2021-07-01 DIAGNOSIS — R7309 Other abnormal glucose: Secondary | ICD-10-CM | POA: Diagnosis not present

## 2021-07-01 DIAGNOSIS — E7800 Pure hypercholesterolemia, unspecified: Secondary | ICD-10-CM | POA: Diagnosis not present

## 2021-07-01 NOTE — Assessment & Plan Note (Signed)
Significant inc in LDL noted Disc goals for lipids and reasons to control them Rev last labs with pt Rev low sat fat diet in detail Will work on diet and re check 3-6 mo (pt will reach out when ready) Has fam h/o high cholesterol

## 2021-07-01 NOTE — Assessment & Plan Note (Signed)
Lab Results  Component Value Date   HGBA1C 5.7 (H) 06/20/2021  eating more sweets disc imp of low glycemic diet and wt loss to prevent DM2

## 2021-07-01 NOTE — Patient Instructions (Addendum)
Avoid red meat/ fried foods/ egg yolks/ fatty breakfast meats/ butter, cheese and high fat dairy/ and shellfish   Change beef to something else more lean Ground [place] is a good sub for ground beef   If you are interested in the shingles vaccine series (Shingrix), call your insurance or pharmacy to check on coverage and location it must be given.  If affordable - you can schedule it here or at your pharmacy depending on coverage   Let us know in 3-6 months when you are ready to check cholesterol and I will put in the order  Take care of yourself   Keep exercising   Use sun protection

## 2021-07-01 NOTE — Assessment & Plan Note (Signed)
Continues forfivo xl 450 mg daily  Does not do well with generic change Reviewed stressors/ coping techniques/symptoms/ support sources/ tx options and side effects in detail today

## 2021-07-01 NOTE — Assessment & Plan Note (Signed)
Reviewed health habits including diet and exercise and skin cancer prevention Reviewed appropriate screening tests for age  Also reviewed health mt list, fam hx and immunization status , as well as social and family history   See HPI Labs reviewed  Interested in shingrix and plans to check on coverage Less stamina (strength) since covid (twice) Plans to f/u with cardiology  Pap utd with gyn , disc hormonal changes Colonoscopy utd  Mammogram utd  PHQ/mood reviewed

## 2021-07-01 NOTE — Progress Notes (Signed)
Subjective:    Patient ID: Elizabeth Hayden, female    DOB: 1971/07/05, 50 y.o.   MRN: [id number]  This visit occurred during the SARS-CoV-2 public health emergency.  Safety protocols were in place, including screening questions prior to the visit, additional usage of staff PPE, and extensive cleaning of exam room while observing appropriate contact time as indicated for disinfecting solutions.   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 with the exception of cholesterol    Zoster status - interested in shingrix  Flu shot -will get outside office with her son  Tetanus- 1/16 Tdap  Covid immunized  Had covid dec/jan of 2020  Had covid in July 2022 - fairly mild  Stamina is not as good since then (since the first covid) -that is frustrating  Just muscle strength   Has not talked to cardiologist in January    Pap 5/22 by gyn [facility] with neg HPV, will re check at 1 y Taking drospirenone (not continuous) - does not get a period   Colonoscopy 11/20 -per pt 3 y (polyps) recall   Mammogram 11/21 Self breast exam - no new lumps / dense fibrocystic  MGM had breast cancer  BP Readings from Last 3 Encounters:  07/01/21 132/78  02/11/21 124/70  09/17/20 114/64   Pulse Readings from Last 3 Encounters:  07/01/21 71  09/17/20 (!) 58  06/26/20 77   Takes metoprolol xl 50 mg daily from cardiology  PHQ Mood Taking forfivo xl 450 mg daily Does poorly when they change her generics  Stress level is high    H/o elevated glucose Lab Results  Component Value Date   HGBA1C 5.7 (H) 06/20/2021  More sweets   Cholesterol Lab Results  Component Value  Date   CHOL 247 (H) 06/20/2021   CHOL 202 (H) 06/19/2020   CHOL 225 (H) 06/05/2019   Lab Results  Component Value Date   HDL 62 06/20/2021   HDL 62 06/19/2020   HDL 66 06/05/2019   Lab Results  Component Value Date   LDLCALC 173 (H) 06/20/2021   LDLCALC 130 (H) 06/19/2020   LDLCALC 144 (H) 06/05/2019   Lab Results  Component Value Date   TRIG 72 06/20/2021   TRIG 56 06/19/2020   TRIG 77 06/05/2019   Lab Results  Component Value Date   CHOLHDL 4.0 06/20/2021   CHOLHDL 3.3 06/19/2020   CHOLHDL 3.4 06/05/2019   No results found for: LDLDIRECT  Cholesterol is up  Father and GF had high cholesterol and took medication  Diet: more sweets  Eating some cookies Avoids fried foods   Would like to re check in 3-6 months     Patient Active Problem List   Diagnosis Date Noted   Recurrent UTI 06/26/2020   History of abnormal cervical Pap smear 08/23/2019   Colon cancer screening 06/15/2019   Hyperlipidemia 06/15/2019   Left breast mass 07/07/2018   Elevated glucose level 03/25/2018   Family history of ovarian cancer 06/08/2017   Family history of breast cancer 06/08/2017   H/O fracture of wrist  06/02/2017   Coarctation of aorta 11/24/2011   Aortic aneurysm (HCC) 11/24/2011   Gynecological examination 05/19/2011   Routine general medical examination at a health care facility 05/19/2011   History of eating disorder 11/02/2007   Dysthymia 11/02/2007   CARDIOMYOPATHY, HYPERTROPHIC, OBSTRUCTIVE 11/02/2007   Allergic rhinitis 11/02/2007   BICUSPID AORTIC VALVE 11/02/2007   Past Medical History:  Diagnosis Date   Aortic root dilation (HCC)    Colon polyp 2020   Congenital heart disease    bicuspid aortic valve with coarctation repair 04/1974   Depression    Edema    Fibrocystic breast    GERD (gastroesophageal reflux disease)    takes Zantac daily as needed   HPV (human papilloma virus) infection    Seizures (HCC)    as a baby and only 1 time d/t high fever    Shortness of breath dyspnea    with exertion   Sinus infection    Nov 16 and treated with antibiotic   TMJ (dislocation of temporomandibular joint)    Wears a night gear    UTI (lower urinary tract infection)    hx of    Yeast infection    08/17/14   Past Surgical History:  Procedure Laterality Date   ASCENDING AORTIC ROOT REPLACEMENT N/A 09/03/2014   Procedure: ASCENDING AORTIC ROOT REPLACEMENT & VALVE SPARING ROOT;  Surgeon: Alleen Borne, MD;  Location: [facility] OR;  Service: Open Heart Surgery;  Laterality: N/A;  CIRC ARREST   COARCTATION OF AORTA EXCISION  1975   COLONOSCOPY  08/2019   [facility] in [place], repeat after 5 yrs   COLPOSCOPY  04/26/2015   CRYOTHERAPY  2009   EYE SURGERY     EYE SURGERY Bilateral 1985   INTRAOPERATIVE TRANSESOPHAGEAL ECHOCARDIOGRAM N/A 09/03/2014   Procedure: INTRAOPERATIVE TRANSESOPHAGEAL ECHOCARDIOGRAM;  Surgeon: Alleen Borne, MD;  Location: [facility] OR;  Service: Open Heart Surgery;  Laterality: N/A;   LEFT HEART CATHETERIZATION WITH CORONARY ANGIOGRAM N/A 08/06/2014   Procedure: LEFT HEART CATHETERIZATION WITH CORONARY ANGIOGRAM;  Surgeon: Wendall Stade, MD;  Location: [facility] CATH LAB;  Service: Cardiovascular;  Laterality: N/A;   TEE WITHOUT CARDIOVERSION N/A 08/06/2014   Procedure: TRANSESOPHAGEAL ECHOCARDIOGRAM (TEE);  Surgeon: Wendall Stade, MD;  [facility] ENDOSCOPY;  Service: Cardiovascular;  Laterality: N/A;   Social History   Tobacco Use   Smoking status: Never   Smokeless tobacco: Never  Vaping Use   Vaping Use: Never used  Substance Use Topics   Alcohol use: Yes    Alcohol/week: 0.0 standard drinks    Comment: occasional   Drug use: No   Family History  Problem Relation Age of Onset   Hypertension Father    Diabetes Father        DM type 2   Cancer Father        lung    Breast cancer Maternal Grandmother        Malignant   Leukemia Maternal Grandmother    Ovarian cancer Paternal Grandmother        38s, malignant   Celiac disease  Son    Down syndrome Son    Allergies  Allergen Reactions   Ace Inhibitors     REACTION: rash and cough   Altace [Ramipril] Hives and Itching   Current Outpatient Medications on File Prior to Visit  Medication Sig Dispense Refill   Biotin w/ Vitamins C & E (HAIR SKIN & NAILS GUMMIES PO) Take 1 tablet by mouth daily.  CALCIUM-VITAMIN D PO Take 2 tablets by mouth daily.     Drospirenone (SLYND) 4 MG TABS Take 1 tablet by mouth daily. 84 tablet 3   FORFIVO XL 450 MG TB24 Take 1 tablet by mouth daily. 90 tablet 3   metoprolol succinate (TOPROL-XL) 50 MG 24 hr tablet TAKE 1 TABLET BY MOUTH  DAILY WITH OR IMMEDIATELY  FOLLOWING A MEAL 90 tablet 3   omeprazole (PRILOSEC) 20 MG capsule Take 20 mg by mouth as needed.      No current facility-administered medications on file prior to visit.    Review of Systems  Constitutional:  Positive for fatigue. Negative for activity change, appetite change, fever and unexpected weight change.  HENT:  Negative for congestion, ear pain, rhinorrhea, sinus pressure and sore throat.   Eyes:  Negative for pain, redness and visual disturbance.  Respiratory:  Negative for cough, shortness of breath and wheezing.   Cardiovascular:  Negative for chest pain and palpitations.  Gastrointestinal:  Negative for abdominal pain, blood in stool, constipation and diarrhea.  Endocrine: Negative for polydipsia and polyuria.  Genitourinary:  Negative for dysuria, frequency and urgency.       Some vasomotor menopause symptoms   Craves sweets  Musculoskeletal:  Negative for arthralgias, back pain and myalgias.  Skin:  Negative for pallor and rash.  Allergic/Immunologic: Negative for environmental allergies.  Neurological:  Positive for weakness. Negative for dizziness, syncope and headaches.       Generally more weak (with exercise) ever since first round of covid in 2020  Hematological:  Negative for adenopathy. Does not bruise/bleed easily.  Psychiatric/Behavioral:   Negative for decreased concentration and dysphoric mood. The patient is not nervous/anxious.        Feels generally stressed      Objective:   Physical Exam Constitutional:      General: She is not in acute distress.    Appearance: Normal appearance. She is well-developed and normal weight. She is not ill-appearing or diaphoretic.  HENT:     Head: Normocephalic and atraumatic.     Right Ear: Tympanic membrane, ear canal and external ear normal.     Left Ear: Tympanic membrane, ear canal and external ear normal.     Nose: Nose normal. No congestion.     Mouth/Throat:     Mouth: Mucous membranes are moist.     Pharynx: Oropharynx is clear. No posterior oropharyngeal erythema.  Eyes:     General: No scleral icterus.    Extraocular Movements: Extraocular movements intact.     Conjunctiva/sclera: Conjunctivae normal.     Pupils: Pupils are equal, round, and reactive to light.  Neck:     Thyroid: No thyromegaly.     Vascular: No carotid bruit or JVD.  Cardiovascular:     Rate and Rhythm: Normal rate and regular rhythm.     Pulses: Normal pulses.     Heart sounds: Normal heart sounds.    No gallop.  Pulmonary:     Effort: Pulmonary effort is normal. No respiratory distress.     Breath sounds: Normal breath sounds. No wheezing.     Comments: Good air exch Chest:     Chest wall: No tenderness.  Abdominal:     General: Bowel sounds are normal. There is no distension or abdominal bruit.     Palpations: Abdomen is soft. There is no mass.     Tenderness: There is no abdominal tenderness.     Hernia: No hernia is present.  Genitourinary:  Comments: Breast and pelvic exam are done by gyn    Musculoskeletal:        General: No tenderness. Normal range of motion.     Cervical back: Normal range of motion and neck supple. No rigidity. No muscular tenderness.     Right lower leg: No edema.     Left lower leg: No edema.  Lymphadenopathy:     Cervical: No cervical adenopathy.  Skin:     General: Skin is warm and dry.     Coloration: Skin is not pale.     Findings: No erythema or rash.     Comments: Solar lentigines diffusely   Neurological:     Mental Status: She is alert. Mental status is at baseline.     Cranial Nerves: No cranial nerve deficit.     Motor: No abnormal muscle tone.     Coordination: Coordination normal.     Gait: Gait normal.     Deep Tendon Reflexes: Reflexes are normal and symmetric. Reflexes normal.  Psychiatric:        Mood and Affect: Mood normal.        Cognition and Memory: Cognition and memory normal.          Assessment & Plan:   Problem List Items Addressed This Visit       Other   Dysthymia    Continues forfivo xl 450 mg daily  Does not do well with generic change Reviewed stressors/ coping techniques/symptoms/ support sources/ tx options and side effects in detail today        Routine general medical examination at a health care facility - Primary    Reviewed health habits including diet and exercise and skin cancer prevention Reviewed appropriate screening tests for age  Also reviewed health mt list, fam hx and immunization status , as well as social and family history   See HPI Labs reviewed  Interested in shingrix and plans to check on coverage Less stamina (strength) since covid (twice) Plans to f/u with cardiology  Pap utd with gyn , disc hormonal changes Colonoscopy utd  Mammogram utd  PHQ/mood reviewed        Elevated glucose level    Lab Results  Component Value Date   HGBA1C 5.7 (H) 06/20/2021  eating more sweets disc imp of low glycemic diet and wt loss to prevent DM2        Hyperlipidemia    Significant inc in LDL noted Disc goals for lipids and reasons to control them Rev last labs with pt Rev low sat fat diet in detail Will work on diet and re check 3-6 mo (pt will reach out when ready) Has fam h/o high cholesterol

## 2021-07-09 ENCOUNTER — Other Ambulatory Visit: Payer: Self-pay | Admitting: Family Medicine

## 2021-10-07 ENCOUNTER — Other Ambulatory Visit: Payer: Self-pay

## 2021-10-07 ENCOUNTER — Ambulatory Visit
Admission: RE | Admit: 2021-10-07 | Discharge: 2021-10-07 | Disposition: A | Discharge status: Home / Self Care | Payer: Managed Care, Other (non HMO) | Source: Ambulatory Visit | Attending: Obstetrics and Gynecology | Admitting: Obstetrics and Gynecology

## 2021-10-07 DIAGNOSIS — Z1231 Encounter for screening mammogram for malignant neoplasm of breast: Secondary | ICD-10-CM

## 2021-11-03 ENCOUNTER — Ambulatory Visit: Payer: Managed Care, Other (non HMO) | Admitting: Cardiovascular Disease

## 2021-11-03 NOTE — Progress Notes (Signed)
Cardiology Office Note   Date:  11/05/2021   ID:  Elizabeth Hayden, DOB 10/21/1970, MRN [id number]  PCP:  Elizabeth Hayden, Elizabeth A, MD  Cardiologist: Dr. Eden EmmsNishan, MD   No chief complaint on file.   History of Present Illness:  51 y.o. f/u AVR for bicuspid AV and aortic root aneurysm   09/03/14  AVR with Bartle  Cath with no CAD prior to surgery    3.   Valve-sparing aortic root replacement with reimplantation of the native aortic valve and left and right coronary arteries ( 28 mm Gelweave valsalva graft). 4.   Replacement of ascending aortic aneurysm with Hayden 28 mm Hemashield graft using deep hypothermic circulatory arrest.  Echo 09/20/18 with no significant AR/gradients normal EF and root  Seeing her son Elizabeth Hayden today who has Down's Syndrome  Daughter age 53 works in [employer] in [place]    LDL has been as high as 171 on September labs Note from primary indicates Trying diet Rx Discussed starting statin given aneurysm history   Still working at [employer] Doing well Dental in good shape    Past Medical History:  Diagnosis Date   Aortic root dilation (HCC)    Colon polyp 2020   Congenital heart disease    bicuspid aortic valve with coarctation repair 04/1974   Depression    Edema    Fibrocystic breast    GERD (gastroesophageal reflux disease)    takes Zantac daily as needed   HPV (human papilloma virus) infection    Seizures (HCC)    as Hayden baby and only 1 time d/t high fever   Shortness of breath dyspnea    with exertion   Sinus infection    Nov 16 and treated with antibiotic   TMJ (dislocation of temporomandibular joint)    Wears Hayden night gear    UTI (lower urinary tract infection)    hx of    Yeast infection    08/17/14    Past Surgical History:  Procedure Laterality Date   ASCENDING AORTIC ROOT REPLACEMENT N/Hayden 09/03/2014   Procedure: ASCENDING AORTIC ROOT REPLACEMENT & VALVE SPARING ROOT;  Surgeon: Alleen BorneBryan K Bartle, MD;  Location: [facility] OR;  Service: Open Heart Surgery;   Laterality: N/Hayden;  CIRC ARREST   COARCTATION OF AORTA EXCISION  1975   COLONOSCOPY  08/2019   [facility] in [place], repeat after 5 yrs   COLPOSCOPY  04/26/2015   CRYOTHERAPY  2009   EYE SURGERY     EYE SURGERY Bilateral 1985   INTRAOPERATIVE TRANSESOPHAGEAL ECHOCARDIOGRAM N/Hayden 09/03/2014   Procedure: INTRAOPERATIVE TRANSESOPHAGEAL ECHOCARDIOGRAM;  Surgeon: Alleen BorneBryan K Bartle, MD;  Location: [facility] OR;  Service: Open Heart Surgery;  Laterality: N/Hayden;   LEFT HEART CATHETERIZATION WITH CORONARY ANGIOGRAM N/Hayden 08/06/2014   Procedure: LEFT HEART CATHETERIZATION WITH CORONARY ANGIOGRAM;  Surgeon: Wendall StadePeter C Anesa Fronek, MD;  [facility];  Service: Cardiovascular;  Laterality: N/Hayden;   TEE WITHOUT CARDIOVERSION N/Hayden 08/06/2014   Procedure: TRANSESOPHAGEAL ECHOCARDIOGRAM (TEE);  Surgeon: Wendall StadePeter C Porshe Fleagle, MD;  [facility];  Service: Cardiovascular;  Laterality: N/Hayden;     Current Outpatient Medications  Medication Sig Dispense Refill   Biotin w/ Vitamins C & E (HAIR SKIN & NAILS GUMMIES PO) Take 1 tablet by mouth daily.     CALCIUM-VITAMIN D PO Take 2 tablets by mouth daily.     Drospirenone (SLYND) 4 MG TABS Take 1 tablet by mouth daily. 84 tablet 3   FORFIVO XL 450 MG TB24  TAKE 1 TABLET BY MOUTH  DAILY 90 tablet 3   metoprolol succinate (TOPROL-XL) 50 MG 24 hr tablet TAKE 1 TABLET BY MOUTH  DAILY WITH OR IMMEDIATELY  FOLLOWING Hayden MEAL 90 tablet 3   omeprazole (PRILOSEC) 20 MG capsule Take 20 mg by mouth as needed.      No current facility-administered medications for this visit.    Allergies:   Ace inhibitors and Altace [ramipril]    Social History:  The patient  reports that she has never smoked. She has never used smokeless tobacco. She reports current alcohol use. She reports that she does not use drugs.   Family History:  The patient'sfamily history includes Breast cancer in her maternal grandmother; Cancer in her father; Celiac disease in her son; Diabetes in her father; Down syndrome in her  son; Hypertension in her father; Leukemia in her maternal grandmother; Ovarian cancer in her paternal grandmother.    ROS:  Please see the history of present illness. Otherwise, review of systems are positive for none.   All other systems are reviewed and negative.    PHYSICAL EXAM: VS:  BP 122/70    Pulse 62    Ht 5\' 8"  (1.727 m)    Wt 163 lb (73.9 kg)    SpO2 98%    BMI 24.78 kg/m  , BMI Body mass index is 24.78 kg/m.   Affect appropriate Healthy:  appears stated age HEENT: normal Neck supple with no adenopathy JVP normal no bruits no thyromegaly Lungs clear with no wheezing and good diaphragmatic motion Heart:  S1/S2 SEM no AR  murmur, no rub, gallop or click PMI normal post sternotomy  Abdomen: benighn, BS positve, no tenderness, no AAA no bruit.  No HSM or HJR Distal pulses intact with no bruits No edema Neuro non-focal Skin warm and dry No muscular weakness     EKG:  SR rate 70 LAE LAD 08/11/19 09/17/20 SR rate 58 nonspecific ST changes   Recent Labs: 06/20/2021: ALT 18; BUN 10; Creatinine, Ser 0.99; Hemoglobin 13.7; Platelets 274; Potassium 4.3; Sodium 138; TSH 2.340    Lipid Panel    Component Value Date/Time   CHOL 247 (H) 06/20/2021 0900   TRIG 72 06/20/2021 0900   HDL 62 06/20/2021 0900   CHOLHDL 4.0 06/20/2021 0900   CHOLHDL 3.4 CALC 11/13/2008 1030   VLDL 9 11/13/2008 1030   LDLCALC 173 (H) 06/20/2021 0900      Wt Readings from Last 3 Encounters:  11/05/21 163 lb (73.9 kg)  07/01/21 157 lb 6 oz (71.4 kg)  02/11/21 159 lb (72.1 kg)    Other studies Reviewed: Additional studies/ records that were reviewed today include:   Echocardiogram 09/20/2018: Study Conclusions   - Left ventricle: The cavity size was normal. Wall thickness was   increased in Hayden pattern of mild LVH. Systolic function was normal.   The estimated ejection fraction was in the range of 55% to 60%.   Wall motion was normal; there were no regional wall motion   abnormalities.  Doppler parameters are consistent with abnormal   left ventricular relaxation (grade 1 diastolic dysfunction). - Aorta: Stable aortic root size. Prior valve sparing aortic root   replacement. Reported coarctation repair visualized without   significant stenosis. - Mitral valve: Calcified annulus. Mildly thickened leaflets . - Left atrium: The atrium was normal in size. - Inferior vena cava: The vessel was normal in size. The   respirophasic diameter changes were in the normal range (>= 50%),  consistent with normal central venous pressure.   Impressions:   - Compared to Hayden prior study in 2016, the post surgical changes are   stable.     ASSESSMENT AND PLAN:  1.  Aortic aneurysm: -Post root replacement 08/2014 per Dr. Lavinia Sharps -No symptoms -Continue beta-blocker - SBE prophylaxis   2.  Bicuspid AV: -post valve sparing AVR with root replacement Echo 09/2018  EF normal -  No AS/AR since its been 3 years will update echo Previous coarctation Repair as well check suprasternal notch velocities    3.  Chronic edema: -Consider as needed diuretic  4. HLDL  followed by primary LDL 173 note from September indicated trying diet Rx Start crestor 5 mg f/u labs in 3 months   Current medicines are reviewed at length with the patient today.  The patient does not have concerns regarding medicines.  Labs/ tests ordered today include: Echo f/u AV repair with root replacement   Orders Placed This Encounter  Procedures   EKG 12-Lead   ECHOCARDIOGRAM COMPLETE   Crestor 5 mg Lipid/Liver 3 months   Disposition:   FU  In Hayden year    Signed, Charlton Haws, MD  11/05/2021 9:27 AM    [facility] [address] [place], [place], [place]  [address] Phone: [phone]; Fax: [phone]

## 2021-11-05 ENCOUNTER — Other Ambulatory Visit: Payer: Self-pay

## 2021-11-05 ENCOUNTER — Encounter: Payer: Self-pay | Admitting: Cardiovascular Disease

## 2021-11-05 ENCOUNTER — Ambulatory Visit: Payer: Managed Care, Other (non HMO) | Admitting: Cardiovascular Disease

## 2021-11-05 VITALS — BP 122/70 | HR 62 | Ht 68.0 in | Wt 163.0 lb

## 2021-11-05 DIAGNOSIS — I359 Nonrheumatic aortic valve disorder, unspecified: Secondary | ICD-10-CM

## 2021-11-05 DIAGNOSIS — Q231 Congenital insufficiency of aortic valve: Secondary | ICD-10-CM

## 2021-11-05 DIAGNOSIS — E782 Mixed hyperlipidemia: Secondary | ICD-10-CM | POA: Diagnosis not present

## 2021-11-05 MED ORDER — ROSUVASTATIN CALCIUM 5 MG PO TABS: 5.0000 mg | ORAL_TABLET | Freq: Every day | ORAL | 3 refills | Status: DC

## 2021-11-05 NOTE — Patient Instructions (Addendum)
Medication Instructions:  Your physician has recommended you make the following change in your medication:  1-START Crestor 5 mg by mouth daily.  *If you need a refill on your cardiac medications before your next appointment, please call your pharmacy*   Lab Work: Your physician recommends that you return for lab work in: 3 months for fasting lipid and liver panel.  If you have labs (blood work) drawn today and your tests are completely normal, you will receive your results only by: MyChart Message (if you have MyChart) OR A paper copy in the mail If you have any lab test that is abnormal or we need to change your treatment, we will call you to review the results.   Testing/Procedures: Your physician has requested that you have an echocardiogram. Echocardiography is a painless test that uses sound waves to create images of your heart. It provides your doctor with information about the size and shape of your heart and how well your hearts chambers and valves are working. This procedure takes approximately one hour. There are no restrictions for this procedure.  Follow-Up: At [facility], you and your health needs are our priority.  As part of our continuing mission to provide you with exceptional heart care, we have created designated Provider Care Teams.  These Care Teams include your primary Cardiologist (physician) and Advanced Practice Providers (APPs -  Physician Assistants and Nurse Practitioners) who all work together to provide you with the care you need, when you need it.  We recommend signing up for the patient portal called "MyChart".  Sign up information is provided on this After Visit Summary.  MyChart is used to connect with patients for Virtual Visits (Telemedicine).  Patients are able to view lab/test results, encounter notes, upcoming appointments, etc.  Non-urgent messages can be sent to your provider as well.   To learn more about what you can do with MyChart, go to  [email or web address].    Your next appointment:   1 year(s)  The format for your next appointment:   In Person  Provider:   Charlton Haws, MD {

## 2021-11-21 ENCOUNTER — Other Ambulatory Visit: Payer: Self-pay

## 2021-11-21 ENCOUNTER — Ambulatory Visit (HOSPITAL_COMMUNITY): Payer: Managed Care, Other (non HMO) | Attending: Cardiology

## 2021-11-21 DIAGNOSIS — I359 Nonrheumatic aortic valve disorder, unspecified: Secondary | ICD-10-CM | POA: Diagnosis present

## 2021-11-21 LAB — ECHOCARDIOGRAM COMPLETE
Area-P 1/2: 2.82 cm2
S' Lateral: 2.4 cm

## 2021-12-20 ENCOUNTER — Other Ambulatory Visit: Payer: Self-pay | Admitting: Obstetrics and Gynecology

## 2021-12-20 DIAGNOSIS — Z3041 Encounter for surveillance of contraceptive pills: Secondary | ICD-10-CM

## 2022-01-24 LAB — LIPID PANEL
Chol/HDL Ratio: 2.5 ratio (ref 0.0–4.4)
Cholesterol, Total: 144 mg/dL (ref 100–199)
HDL: 58 mg/dL (ref >39)
LDL Chol Calc (NIH): 75 mg/dL (ref 0–99)
Triglycerides: 50 mg/dL (ref 0–149)
VLDL Cholesterol Cal: 11 mg/dL (ref 5–40)

## 2022-01-24 LAB — HEPATIC FUNCTION PANEL
ALT: 18 IU/L (ref 0–32)
AST: 19 IU/L (ref 0–40)
Albumin: 4.6 g/dL (ref 3.8–4.9)
Alkaline Phosphatase: 48 IU/L (ref 44–121)
Bilirubin Total: 0.5 mg/dL (ref 0.0–1.2)
Bilirubin, Direct: 0.17 mg/dL (ref 0.00–0.40)
Total Protein: 7 g/dL (ref 6.0–8.5)

## 2022-01-28 ENCOUNTER — Other Ambulatory Visit: Payer: Self-pay

## 2022-01-28 MED ORDER — ROSUVASTATIN CALCIUM 5 MG PO TABS: 5.0000 mg | ORAL_TABLET | Freq: Every day | ORAL | 3 refills | Status: DC

## 2022-02-08 ENCOUNTER — Other Ambulatory Visit: Payer: Self-pay | Admitting: Cardiovascular Disease

## 2022-03-23 NOTE — Progress Notes (Signed)
PCP:  Abner Greenspan, MD   Chief Complaint  Patient presents with   Gynecologic Exam    No concerns     HPI:      [name] is a 51 y.o. [id number] who LMP was Patient's last menstrual period was 03/05/2022 (exact date)., presents today for her annual examination.  Her menses are irregular with camila and slynd, random, lasting 3-7 days, light flow to mod flow. Was on OCPs with improved cycle and flow but changed to POPs in past due to HTN. Stopped a few wks ago when Rx ran out and pt no longer sexually active. Has vasomotor sx.    Sex activity: not currently sexually active; no vag sx.  Last Pap: 02/11/21 ASCUS/neg HPV DNA. Repeat due today.  07/06/18 Results were no abnormalities/neg HPV DNA. 05/2017 with ASCUS /POS HPV DNA. Colpo with neg bx 07/08/17. Previous colpo 04/26/15 with neg bx after ASCUS/POS HPV DNA pap 4/16. Repeat pap Q 3 yrs if normal per [facility]. Hx of STDs: HPV  Last mammogram: 10/07/21  Results were: normal--routine follow-up in 12 months. Hx of LT breast cyst and fibrocystic breasts.  There is a FH of breast cancer in her MGM. There is a FH of ovarian cancer in her PGM. Pt tested BRCA neg 2014 through [place] 2014=17%. Neg Vistaseq update testing done 2019 with [facility]. The patient does not do self-breast exams.  Tobacco use: The patient denies current or previous tobacco use. Alcohol use: social drinker No drug use.  Exercise: moderately active  She does get adequate calcium and Vitamin D in her diet.  Colonoscopy: 11/20 with polyps; repeat due after 3 yrs per pt. Done at [facility] in [place]. Has scheduled for 7/23  Labs with PCP/cardio. On metoprolol for HTN issues after aortic valve repair surgery 2015.    Past Medical History:  Diagnosis Date   Aortic root dilation ([place])    Colon polyp 2020   Congenital heart disease    bicuspid aortic valve with coarctation repair 04/1974   Depression    Edema    Fibrocystic breast    GERD (gastroesophageal  reflux disease)    takes Zantac daily as needed   HPV (human papilloma virus) infection    Seizures (HCC)    as a baby and only 1 time d/t high fever   Shortness of breath dyspnea    with exertion   Sinus infection    Nov 16 and treated with antibiotic   TMJ (dislocation of temporomandibular joint)    Wears a night gear    UTI (lower urinary tract infection)    hx of    Yeast infection    08/17/14    Past Surgical History:  Procedure Laterality Date   ASCENDING AORTIC ROOT REPLACEMENT N/A 09/03/2014   Procedure: ASCENDING AORTIC ROOT REPLACEMENT & VALVE SPARING ROOT;  Surgeon: Gaye Pollack, MD;  Location: [facility];  Service: Open Heart Surgery;  Laterality: N/A;  CIRC ARREST   COARCTATION OF AORTA EXCISION  1975   COLONOSCOPY  08/2019   [facility] in [place], repeat after 5 yrs   COLPOSCOPY  04/26/2015   CRYOTHERAPY  2009   EYE SURGERY     EYE SURGERY Bilateral 1985   INTRAOPERATIVE TRANSESOPHAGEAL ECHOCARDIOGRAM N/A 09/03/2014   Procedure: INTRAOPERATIVE TRANSESOPHAGEAL ECHOCARDIOGRAM;  Surgeon: Gaye Pollack, MD;  Location: [facility];  Service: Open Heart Surgery;  Laterality: N/A;   LEFT HEART CATHETERIZATION WITH CORONARY ANGIOGRAM N/A 08/06/2014  Procedure: LEFT HEART CATHETERIZATION WITH CORONARY ANGIOGRAM;  Surgeon: Josue Hector, MD;  Location[facility] C[facility];  Service: Cardiovascular;  Laterality: N/A;   TEE WITHOUT CARDIOVERSION N/A 08/06/2014   Procedure: TRANSESOPHAGEAL ECHOCARDIOGRAM (TEE);  Surgeon: Josue Hector, MD;  Location: [facility] ENDOSCOPY;  Service: Cardiovascular;  Laterality: N/A;    Family History  Problem Relation Age of Onset   Hypertension Father    Diabetes Father        DM type 2   Cancer Father        lung    Breast cancer Maternal Grandmother        57s   Leukemia Maternal Grandmother    Ovarian cancer Paternal Grandmother        78s, malignant   Celiac disease Son    Down syndrome Son     Social History   Socioeconomic History   Marital status:  Divorced    Spouse name: Not on file   Number of children: 2   Years of education: Not on file   Highest education level: Not on file  Occupational History   Not on file  Tobacco Use   Smoking status: Never   Smokeless tobacco: Never  Vaping Use   Vaping Use: Never used  Substance and Sexual Activity   Alcohol use: Yes    Alcohol/week: 0.0 standard drinks of alcohol    Comment: occasional   Drug use: No   Sexual activity: Not Currently    Birth control/protection: None  Other Topics Concern   Not on file  Social History Narrative   Not on file   Social Determinants of Health   Financial Resource Strain: Not on file  Food Insecurity: Not on file  Transportation Needs: Not on file  Physical Activity: Not on file  Stress: Not on file  Social Connections: Not on file  Intimate Partner Violence: Not on file    Current Meds  Medication Sig   Biotin w/ Vitamins C & E (HAIR SKIN & NAILS GUMMIES PO) Take 1 tablet by mouth daily.   CALCIUM-VITAMIN D PO Take 2 tablets by mouth daily.   FORFIVO XL 450 MG TB24 TAKE 1 TABLET BY MOUTH  DAILY   metoprolol succinate (TOPROL-XL) 50 MG 24 hr tablet TAKE 1 TABLET BY MOUTH  DAILY WITH OR IMMEDIATELY  FOLLOWING A MEAL   rosuvastatin (CRESTOR) 5 MG tablet Take 1 tablet (5 mg total) by mouth daily.     ROS:  Review of Systems  Constitutional:  Negative for fatigue, fever and unexpected weight change.  Respiratory:  Negative for cough, shortness of breath and wheezing.   Cardiovascular:  Negative for chest pain, palpitations and leg swelling.  Gastrointestinal:  Negative for blood in stool, constipation, diarrhea, nausea and vomiting.  Endocrine: Positive for heat intolerance. Negative for cold intolerance and polyuria.  Genitourinary:  Negative for dyspareunia, dysuria, flank pain, frequency, genital sores, hematuria, menstrual problem, pelvic pain, urgency, vaginal bleeding, vaginal discharge and vaginal pain.  Musculoskeletal:   Negative for back pain, joint swelling and myalgias.  Skin:  Negative for rash.  Neurological:  Negative for dizziness, syncope, light-headedness, numbness and headaches.  Hematological:  Negative for adenopathy.  Psychiatric/Behavioral:  Negative for agitation, confusion, dysphoric mood, sleep disturbance and suicidal ideas. The patient is not nervous/anxious.      Objective: BP 90/60   Ht '5\' 8"'  (1.727 m)   Wt 160 lb (72.6 kg)   LMP 03/05/2022 (Exact Date)   BMI 24.33 kg/m  Physical Exam Constitutional:      Appearance: She is well-developed.  Genitourinary:     Vulva normal.     Right Labia: No rash, tenderness or lesions.    Left Labia: No tenderness, lesions or rash.    No vaginal discharge, erythema or tenderness.      Right Adnexa: not tender and no mass present.    Left Adnexa: not tender and no mass present.    No cervical motion tenderness, friability or polyp.     Uterus is not enlarged or tender.  Breasts:    Right: No mass, nipple discharge, skin change or tenderness.     Left: No mass, nipple discharge, skin change or tenderness.  Neck:     Thyroid: No thyromegaly.  Cardiovascular:     Rate and Rhythm: Normal rate and regular rhythm.     Heart sounds: Murmur heard.     Systolic murmur is present with a grade of 3/6.  Pulmonary:     Effort: Pulmonary effort is normal.     Breath sounds: Normal breath sounds.  Abdominal:     Palpations: Abdomen is soft.     Tenderness: There is no abdominal tenderness. There is no guarding or rebound.  Musculoskeletal:        General: Normal range of motion.     Cervical back: Normal range of motion.  Lymphadenopathy:     Cervical: No cervical adenopathy.  Neurological:     General: No focal deficit present.     Mental Status: She is alert and oriented to person, place, and time.     Cranial Nerves: No cranial nerve deficit.  Skin:    General: Skin is warm and dry.  Psychiatric:        Mood and Affect: Mood  normal.        Behavior: Behavior normal.        Thought Content: Thought content normal.        Judgment: Judgment normal.  Vitals reviewed.     Assessment/Plan: Encounter for annual routine gynecological examination  Cervical cancer screening - Plan: IGP, Aptima HPV  Screening for HPV (human papillomavirus) - Plan: IGP, Aptima HPV  History of abnormal cervical Pap smear - Plan: IGP, Aptima HPV; repeat pap today, will f/u with results.   Encounter for screening mammogram for malignant neoplasm of breast - Plan: MM 3D SCREEN BREAST BILATERAL, pt to schedule mammo for 12/23  Family history of breast cancer - Plan: MM 3D SCREEN BREAST BILATERAL, pt is gene panel neg  Family history of ovarian cancer  Screening for colon cancer--has colonoscopy 7/23  Pt stoppped POPs. Will follow cycles and f/u prn. If needs prog only BC again for bleeding/contraception, can try aygestin.  Herbal products suggested for vasomotor sx.       GYN counsel breast self exam, mammography screening, adequate intake of calcium and vitamin D     F/U  Return in about 1 year (around 03/25/2023).  Darnita Woodrum B. Khadeeja Elden, PA-C 03/24/2022 12:13 PM

## 2022-03-24 ENCOUNTER — Encounter: Payer: Self-pay | Admitting: Obstetrics and Gynecology

## 2022-03-24 ENCOUNTER — Ambulatory Visit (INDEPENDENT_AMBULATORY_CARE_PROVIDER_SITE_OTHER): Payer: Managed Care, Other (non HMO) | Admitting: Obstetrics and Gynecology

## 2022-03-24 VITALS — BP 90/60 | Ht 68.0 in | Wt 160.0 lb

## 2022-03-24 DIAGNOSIS — Z803 Family history of malignant neoplasm of breast: Secondary | ICD-10-CM

## 2022-03-24 DIAGNOSIS — Z124 Encounter for screening for malignant neoplasm of cervix: Secondary | ICD-10-CM

## 2022-03-24 DIAGNOSIS — Z1211 Encounter for screening for malignant neoplasm of colon: Secondary | ICD-10-CM

## 2022-03-24 DIAGNOSIS — Z1151 Encounter for screening for human papillomavirus (HPV): Secondary | ICD-10-CM | POA: Diagnosis not present

## 2022-03-24 DIAGNOSIS — Z8742 Personal history of other diseases of the female genital tract: Secondary | ICD-10-CM

## 2022-03-24 DIAGNOSIS — Z01419 Encounter for gynecological examination (general) (routine) without abnormal findings: Secondary | ICD-10-CM

## 2022-03-24 DIAGNOSIS — Z1231 Encounter for screening mammogram for malignant neoplasm of breast: Secondary | ICD-10-CM

## 2022-03-24 NOTE — Patient Instructions (Signed)
I value your feedback and you entrusting us with your care. If you get a [facility] patient survey, I would appreciate you taking the time to let us know about your experience today. Thank you! ? ? ?

## 2022-03-30 LAB — IGP, APTIMA HPV: HPV Aptima: NEGATIVE

## 2022-06-25 ENCOUNTER — Other Ambulatory Visit: Payer: Self-pay | Admitting: Family Medicine

## 2022-06-25 ENCOUNTER — Telehealth: Payer: Self-pay | Admitting: Family Medicine

## 2022-06-25 DIAGNOSIS — Z0000 Encounter for general adult medical examination without abnormal findings: Secondary | ICD-10-CM

## 2022-06-25 DIAGNOSIS — R7309 Other abnormal glucose: Secondary | ICD-10-CM

## 2022-06-25 DIAGNOSIS — E7800 Pure hypercholesterolemia, unspecified: Secondary | ICD-10-CM

## 2022-06-25 NOTE — Telephone Encounter (Signed)
Patient would like for labs to be sent into [facility] to be drawn there prior to her appointment on 07/03/2022. Thank you!

## 2022-06-25 NOTE — Telephone Encounter (Signed)
Orders are in for [facility]  Lab collect  Thanks

## 2022-06-25 NOTE — Addendum Note (Signed)
Addended by: Roxy Manns A on: 06/25/2022 04:31 PM   Modules accepted: Orders

## 2022-06-26 ENCOUNTER — Other Ambulatory Visit: Payer: Managed Care, Other (non HMO)

## 2022-06-26 NOTE — Telephone Encounter (Signed)
Orders are released into [facility]'s sxs and pt notified

## 2022-07-02 LAB — CBC WITH DIFFERENTIAL/PLATELET
Basophils Absolute: 0.1 10*3/uL (ref 0.0–0.2)
Basos: 1 %
EOS (ABSOLUTE): 0.1 10*3/uL (ref 0.0–0.4)
Eos: 2 %
Hematocrit: 39.1 % (ref 34.0–46.6)
Hemoglobin: 13 g/dL (ref 11.1–15.9)
Immature Grans (Abs): 0 10*3/uL (ref 0.0–0.1)
Immature Granulocytes: 0 %
Lymphocytes Absolute: 2.2 10*3/uL (ref 0.7–3.1)
Lymphs: 33 %
MCH: 30.3 pg (ref 26.6–33.0)
MCHC: 33.2 g/dL (ref 31.5–35.7)
MCV: 91 fL (ref 79–97)
Monocytes Absolute: 0.7 10*3/uL (ref 0.1–0.9)
Monocytes: 11 %
Neutrophils Absolute: 3.4 10*3/uL (ref 1.4–7.0)
Neutrophils: 53 %
Platelets: 269 10*3/uL (ref 150–450)
RBC: 4.29 x10E6/uL (ref 3.77–5.28)
RDW: 12 % (ref 11.7–15.4)
WBC: 6.5 10*3/uL (ref 3.4–10.8)

## 2022-07-02 LAB — LIPID PANEL
Chol/HDL Ratio: 2.4 ratio (ref 0.0–4.4)
Cholesterol, Total: 149 mg/dL (ref 100–199)
HDL: 62 mg/dL (ref >39)
LDL Chol Calc (NIH): 72 mg/dL (ref 0–99)
Triglycerides: 78 mg/dL (ref 0–149)
VLDL Cholesterol Cal: 15 mg/dL (ref 5–40)

## 2022-07-02 LAB — COMPREHENSIVE METABOLIC PANEL
ALT: 18 IU/L (ref 0–32)
AST: 24 IU/L (ref 0–40)
Albumin/Globulin Ratio: 2 (ref 1.2–2.2)
Albumin: 4.7 g/dL (ref 3.8–4.9)
Alkaline Phosphatase: 64 IU/L (ref 44–121)
BUN/Creatinine Ratio: 19 (ref 9–23)
BUN: 19 mg/dL (ref 6–24)
Bilirubin Total: 0.4 mg/dL (ref 0.0–1.2)
CO2: 26 mmol/L (ref 20–29)
Calcium: 9.5 mg/dL (ref 8.7–10.2)
Chloride: 103 mmol/L (ref 96–106)
Creatinine, Ser: 1.01 mg/dL — ABNORMAL HIGH (ref 0.57–1.00)
Globulin, Total: 2.3 g/dL (ref 1.5–4.5)
Glucose: 121 mg/dL — ABNORMAL HIGH (ref 70–99)
Potassium: 4.6 mmol/L (ref 3.5–5.2)
Sodium: 142 mmol/L (ref 134–144)
Total Protein: 7 g/dL (ref 6.0–8.5)
eGFR: 67 mL/min/{1.73_m2} (ref >59)

## 2022-07-02 LAB — HEMOGLOBIN A1C
Est. average glucose Bld gHb Est-mCnc: 120 mg/dL
Hgb A1c MFr Bld: 5.8 % — ABNORMAL HIGH (ref 4.8–5.6)

## 2022-07-02 LAB — TSH: TSH: 1.81 u[IU]/mL (ref 0.450–4.500)

## 2022-07-03 ENCOUNTER — Ambulatory Visit (INDEPENDENT_AMBULATORY_CARE_PROVIDER_SITE_OTHER): Payer: Managed Care, Other (non HMO) | Admitting: Family Medicine

## 2022-07-03 VITALS — BP 110/70 | HR 70 | Temp 98.1°F | Ht 68.0 in | Wt 148.6 lb

## 2022-07-03 DIAGNOSIS — R7309 Other abnormal glucose: Secondary | ICD-10-CM

## 2022-07-03 DIAGNOSIS — I712 Thoracic aortic aneurysm, without rupture, unspecified: Secondary | ICD-10-CM | POA: Diagnosis not present

## 2022-07-03 DIAGNOSIS — E7800 Pure hypercholesterolemia, unspecified: Secondary | ICD-10-CM

## 2022-07-03 DIAGNOSIS — Z23 Encounter for immunization: Secondary | ICD-10-CM

## 2022-07-03 DIAGNOSIS — F341 Dysthymic disorder: Secondary | ICD-10-CM

## 2022-07-03 DIAGNOSIS — Z0000 Encounter for general adult medical examination without abnormal findings: Secondary | ICD-10-CM | POA: Diagnosis not present

## 2022-07-03 DIAGNOSIS — Z8659 Personal history of other mental and behavioral disorders: Secondary | ICD-10-CM

## 2022-07-03 NOTE — Assessment & Plan Note (Signed)
Down to bmi of 22 inst her to stop loosing and maintain- she agrees and voiced understanding  Feels she has better control of this now

## 2022-07-03 NOTE — Assessment & Plan Note (Signed)
Lab Results  Component Value Date   HGBA1C 5.8 (H) 07/01/2022   Fairly stable disc imp of low glycemic diet and wt loss to prevent DM2  Commended on good job so far

## 2022-07-03 NOTE — Assessment & Plan Note (Signed)
Disc goals for lipids and reasons to control them Rev last labs with pt Rev low sat fat diet in detail Much improved with crestor 5 mg  LDLdown to 72

## 2022-07-03 NOTE — Assessment & Plan Note (Signed)
Doing well No symptoms  Under care of cardiology  Rev echo from feb  On statin now

## 2022-07-03 NOTE — Patient Instructions (Addendum)
Flu shot today   Keep up the good work with healthy diet and exercise  You do not need to loose more weight /increase protein if needed   Take care of yourself   Preston Fleeting is helpful for menopause symptoms  Keep taking your calcium and vitamin D

## 2022-07-03 NOTE — Assessment & Plan Note (Signed)
Continues forfivo XL 450 for which she pays out of pocket Is very helpful Some menopause symptoms now as well   Reviewed stressors/ coping techniques/symptoms/ support sources/ tx options and side effects in detail today Encouraged good self care Good exercise habits

## 2022-07-03 NOTE — Assessment & Plan Note (Signed)
Reviewed health habits including diet and exercise and skin cancer prevention Reviewed appropriate screening tests for age  Also reviewed health mt list, fam hx and immunization status , as well as social and family history   See HPI Labs reviewed Flu shot today  Will [facility] and pap utd 03/2022 Taking ca and D Colonoscopy 04/2022- some ? Of recall date  Mammogram 09/2021 Good health habits

## 2022-07-03 NOTE — Progress Notes (Unsigned)
Subjective:    Patient ID: Elizabeth Hayden, female    DOB: 06/04/1971, 51 y.o.   MRN: [id number]  HPI Here for health maintenance exam and to review chronic medical problems    Wt Readings from Last 3 Encounters:  07/03/22 148 lb 9.6 oz (67.4 kg)  03/24/22 160 lb (72.6 kg)  11/05/21 163 lb (73.9 kg)   22.59 kg/m  Doing fairly well overall   Joints are more achy lately  Knees hurt  Some joint pain in hands  Some thoracic back pain   Came off the OC and eating less junk (prior to that she was craving sugar)  Getting more protein (some supplements)  Lost weight  Works out 5-6 d per week (HIIT, zumba)   Has body image issues   Immunization History  Administered Date(s) Administered   H1N1 11/13/2008   Hepatitis A, Adult 04/23/2017   Influenza Split 07/25/2012   Influenza Whole 07/15/2007   Influenza,inj,Quad PF,6+ Mos 08/01/2013, 09/08/2014, 07/28/2017, 08/06/2018, 06/23/2019   Janssen (J&J) SARS-COV-2 Vaccination 01/17/2020   Td 04/18/2004   Tdap 10/19/2014   Health Maintenance Due  Topic Date Due   Hepatitis C Screening  Never done   Zoster Vaccines- Shingrix (1 of 2) Never done   COVID-19 Vaccine (2 - Booster for Janssen series) 03/13/2020   INFLUENZA VACCINE  05/12/2022   Flu shot -today   S[facility]rix vaccine -[facility]   Gyn care- June  Pap 03/2022-nl with neg hpv  Takes ca and D  Colonoscopy  04/2022 with 3 y recall  (pt is unsure)-she thinks it is actually 6    Mammogram 09/2021 Self breast exam : no lumps or changes (has fibrocystic)   Sees cardiology for AAA and cardiomyopathy and bicuspid AV (with aortic root replacement)  Takes megoprolol xl 50 mg daily   BP Readings from Last 3 Encounters:  07/03/22 110/70  03/24/22 90/60  11/05/21 122/70   Pulse Readings from Last 3 Encounters:  07/03/22 70  11/05/21 62  07/01/21 71    Mood H/o dysthymia  Takes buproprion xl 450 mg   Past elevated glucose Lab Results   Component Value Date   HGBA1C 5.8 (H) 07/01/2022  Father had diabetes    Hyperlipidemia Lab Results  Component Value Date   CHOL 149 07/01/2022   CHOL 144 01/23/2022   CHOL 247 (H) 06/20/2021   Lab Results  Component Value Date   HDL 62 07/01/2022   HDL 58 01/23/2022   HDL 62 06/20/2021   Lab Results  Component Value Date   LDLCALC 72 07/01/2022   LDLCALC 75 01/23/2022   LDLCALC 173 (H) 06/20/2021   Lab Results  Component Value Date   TRIG 78 07/01/2022   TRIG 50 01/23/2022   TRIG 72 06/20/2021   Lab Results  Component Value Date   CHOLHDL 2.4 07/01/2022   CHOLHDL 2.5 01/23/2022   CHOLHDL 4.0 06/20/2021   No results found for: "LDLDIRECT"  Crestor 5 mg daily (from cardiology)  Much improved   Other labs  Lab Results  Component Value Date   CREATININE 1.01 (H) 07/01/2022   BUN 19 07/01/2022   NA 142 07/01/2022   K 4.6 07/01/2022   CL 103 07/01/2022   CO2 26 07/01/2022   Lab Results  Component Value Date   ALT 18 07/01/2022   AST 24 07/01/2022   ALKPHOS 64 07/01/2022   BILITOT 0.4 07/01/2022   Lab Results  Component Value  Date   WBC 6.5 07/01/2022   HGB 13.0 07/01/2022   HCT 39.1 07/01/2022   MCV 91 07/01/2022   PLT 269 07/01/2022   Lab Results  Component Value Date   TSH 1.810 07/01/2022   Patient Active Problem List   Diagnosis Date Noted   Recurrent UTI 06/26/2020   History of abnormal cervical Pap smear 08/23/2019   Colon cancer screening 06/15/2019   Hyperlipidemia 06/15/2019   Elevated glucose level 03/25/2018   Family history of ovarian cancer 06/08/2017   Family history of breast cancer 06/08/2017   H/O fracture of wrist 06/02/2017   Coarctation of aorta 11/24/2011   Aortic aneurysm (HCC) 11/24/2011   Gynecological examination 05/19/2011   Routine general medical examination at a health care facility 05/19/2011   History of eating disorder 11/02/2007   Dysthymia 11/02/2007   CARDIOMYOPATHY, HYPERTROPHIC, OBSTRUCTIVE  11/02/2007   Allergic rhinitis 11/02/2007   BICUSPID AORTIC VALVE 11/02/2007   Past Medical History:  Diagnosis Date   Aortic root dilation (HCC)    Colon polyp 2020   Congenital heart disease    bicuspid aortic valve with coarctation repair 04/1974   Depression    Edema    Fibrocystic breast    GERD (gastroesophageal reflux disease)    takes Zantac daily as needed   HPV (human papilloma virus) infection    Seizures (HCC)    as a baby and only 1 time d/t high fever   Shortness of breath dyspnea    with exertion   Sinus infection    Nov 16 and treated with antibiotic   TMJ (dislocation of temporomandibular joint)    Wears a night gear    UTI (lower urinary tract infection)    hx of    Yeast infection    08/17/14   Past Surgical History:  Procedure Laterality Date   ASCENDING AORTIC ROOT REPLACEMENT N/A 09/03/2014   Procedure: ASCENDING AORTIC ROOT REPLACEMENT & VALVE SPARING ROOT;  Surgeon: Alleen Borne, MD;  Location: [facility];  Service: Open Heart Surgery;  Laterality: N/A;  CIRC ARREST   COARCTATION OF AORTA EXCISION  1975   COLONOSCOPY  08/2019   [facility] in [place], repeat after 5 yrs   COLPOSCOPY  04/26/2015   CRYOTHERAPY  2009   EYE SURGERY     EYE SURGERY Bilateral 1985   INTRAOPERATIVE TRANSESOPHAGEAL ECHOCARDIOGRAM N/A 09/03/2014   Procedure: INTRAOPERATIVE TRANSESOPHAGEAL ECHOCARDIOGRAM;  Surgeon: Alleen Borne, MD;  Location: [facility];  Service: Open Heart Surgery;  Laterality: N/A;   LEFT HEART CATHETERIZATION WITH CORONARY ANGIOGRAM N/A 08/06/2014   Procedure: LEFT HEART CATHETERIZATION WITH CORONARY ANGIOGRAM;  Surgeon: Wendall Stade, MD;  Location: [facility] CATH LAB;  Service: Cardiovascular;  Laterality: N/A;   TEE WITHOUT CARDIOVERSION N/A 08/06/2014   Procedure: TRANSESOPHAGEAL ECHOCARDIOGRAM (TEE);  Surgeon: Wendall Stade, MD;  Location: [facility] ENDOSCOPY;  Service: Cardiovascular;  Laterality: N/A;   Social History   Tobacco Use   Smoking status: Never   Smokeless  tobacco: Never  Vaping Use   Vaping Use: Never used  Substance Use Topics   Alcohol use: Yes    Alcohol/week: 0.0 standard drinks of alcohol    Comment: occasional   Drug use: No   Family History  Problem Relation Age of Onset   Hypertension Father    Diabetes Father        DM type 2   Cancer Father        lung    Breast cancer  Maternal Grandmother        47s   Leukemia Maternal Grandmother    Ovarian cancer Paternal Grandmother        24s, malignant   Celiac disease Son    Down syndrome Son    Allergies  Allergen Reactions   Ace Inhibitors     REACTION: rash and cough   Altace [Ramipril] Hives and Itching   Current Outpatient Medications on File Prior to Visit  Medication Sig Dispense Refill   Biotin w/ Vitamins C & E (HAIR SKIN & NAILS GUMMIES PO) Take 1 tablet by mouth daily.     CALCIUM-VITAMIN D PO Take 2 tablets by mouth daily.     FORFIVO XL 450 MG TB24 TAKE 1 TABLET BY MOUTH  DAILY 90 tablet 1   metoprolol succinate (TOPROL-XL) 50 MG 24 hr tablet TAKE 1 TABLET BY MOUTH  DAILY WITH OR IMMEDIATELY  FOLLOWING A MEAL 90 tablet 3   rosuvastatin (CRESTOR) 5 MG tablet Take 1 tablet (5 mg total) by mouth daily. 90 tablet 3   No current facility-administered medications on file prior to visit.    Review of Systems     Objective:   Physical Exam        Assessment & Plan:   Problem List Items Addressed This Visit       Cardiovascular and Mediastinum   Aortic aneurysm [facility])    Doing well No symptoms  Under care of cardiology  Rev echo from feb  On statin now         Other   Dysthymia    Continues forfivo XL 450 for which she pays out of pocket Is very helpful Some menopause symptoms now as well   Reviewed stressors/ coping techniques/symptoms/ support sources/ tx options and side effects in detail today Encouraged good self care Good exercise habits       Elevated glucose level    Lab Results  Component Value Date   HGBA1C 5.8 (H) 07/01/2022   Fairly stable disc imp of low glycemic diet and wt loss to prevent DM2  Commended on good job so far       History of eating disorder    Down to bmi of 22 inst her to stop loosing and maintain- she agrees and voiced understanding  Feels she has better control of this now      Hyperlipidemia    Disc goals for lipids and reasons to control them Rev last labs with pt Rev low sat fat diet in detail Much improved with crestor 5 mg  LDLdown to 72       Routine general medical examination at a health care facility - Primary    Reviewed health habits including diet and exercise and skin cancer prevention Reviewed appropriate screening tests for age  Also reviewed health mt list, fam hx and immunization status , as well as social and family history   See HPI Labs reviewed Flu shot today  Will send for [facility] and pap utd 03/2022 Taking ca and D Colonoscopy 04/2022- some ? Of recall date  Mammogram 09/2021 Good health habits       Relevant Orders   Flu Vaccine QUAD 6+ mos PF IM (Fluarix Quad PF) (Completed)   Other Visit Diagnoses     Need for influenza vaccination       Relevant Orders   Flu Vaccine QUAD 6+ mos PF IM (Fluarix Quad PF) (Completed)

## 2022-07-05 ENCOUNTER — Encounter: Payer: Self-pay | Admitting: Family Medicine

## 2022-09-17 ENCOUNTER — Other Ambulatory Visit: Payer: Self-pay | Admitting: Family Medicine

## 2022-09-17 MED ORDER — FORFIVO XL 450 MG PO TB24: 1.0000 | ORAL_TABLET | Freq: Every day | ORAL | 1 refills | Status: DC

## 2022-09-17 NOTE — Telephone Encounter (Signed)
  Encourage patient to contact the pharmacy for refills or they can request refills through [facility]  Did the patient contact the pharmacy: Yes  LAST APPOINTMENT DATE: 07/03/2022  NEXT APPOINTMENT DATE: N/A  MEDICATION: FORFIVO XL 450 MG TB24   Is the patient out of medication? No, a few left  Is this a 90 day supply: Yes  PHARMACY: [id number] - [place], [place] - [address]   Let patient know to contact pharmacy at the end of the day to make sure medication is ready.  Please notify patient to allow 48-72 hours to process

## 2022-09-22 ENCOUNTER — Other Ambulatory Visit: Payer: Self-pay | Admitting: Obstetrics and Gynecology

## 2022-09-22 DIAGNOSIS — Z1231 Encounter for screening mammogram for malignant neoplasm of breast: Secondary | ICD-10-CM

## 2022-09-24 MED ORDER — BUPROPION HCL ER (XL) 300 MG PO TB24: 300.0000 mg | ORAL_TABLET | Freq: Every day | ORAL | 1 refills | Status: DC

## 2022-09-24 MED ORDER — BUPROPION HCL ER (XL) 150 MG PO TB24: 150.0000 mg | ORAL_TABLET | Freq: Every day | ORAL | 1 refills | Status: DC

## 2022-09-24 NOTE — Telephone Encounter (Signed)
Patient called and stated the Elizabeth Hayden is on back order and is there anything that Dr. Milinda Antis can suggest.

## 2022-09-24 NOTE — Telephone Encounter (Signed)
We would have to replace it with wellbutrin until it comes back Does she want me to send in wellbutrin xl ?  Would to 3 150 xl pills daily

## 2022-09-24 NOTE — Telephone Encounter (Signed)
Pt agrees with the change. Only question she asked is if you could send in Rx for a 300 mg and 150 mg tab so she only has to take 2 pills instead of 3 but if that's not an option she is okay taking 3 tabs of the 150 mg. Pt would like a 90 day Rx sent to [facility] [address] (on file). Pt advised if she has any issues or side eff from med to let us know

## 2022-09-24 NOTE — Addendum Note (Signed)
Addended by: Roxy Manns A on: 09/24/2022 12:57 PM   Modules accepted: Orders

## 2022-09-24 NOTE — Telephone Encounter (Signed)
Left VM requesting pt to call the office back 

## 2022-09-25 ENCOUNTER — Other Ambulatory Visit (HOSPITAL_COMMUNITY): Payer: Self-pay

## 2022-09-25 NOTE — Telephone Encounter (Signed)
Patient called and stated she needs a prior authorization for Wellbutrin. Insurance company won't pay for it with an prior authorization cause she can't do the generic brand due to insurance. Call back number [phone].

## 2022-09-29 ENCOUNTER — Other Ambulatory Visit (HOSPITAL_COMMUNITY): Payer: Self-pay

## 2022-09-29 MED ORDER — WELLBUTRIN XL 150 MG PO TB24: 150.0000 mg | ORAL_TABLET | Freq: Every day | ORAL | 1 refills | Status: DC

## 2022-09-29 MED ORDER — WELLBUTRIN XL 300 MG PO TB24: 300.0000 mg | ORAL_TABLET | Freq: Every day | ORAL | 1 refills | Status: DC

## 2022-09-29 NOTE — Telephone Encounter (Signed)
Called pharmacy to get insurance information. Ran test claims for both 150mg  and 300mg  Wellbutrin. No PA needed. Prescriptions must be written as DAW 1 (prescriber requested).  Wellbutrin XL 150MG  copay for 30 day supply: $1,707.01 (DAW 2)  $100.00 (DAW 1)  Wellbutrin XL 300MG  copay for 30 day supply:  $2,253.13 (DAW 2)  $100.00 (DAW 1)

## 2022-09-29 NOTE — Telephone Encounter (Signed)
See prev note. Even with sending DAW it's $500 pt stated

## 2022-09-29 NOTE — Addendum Note (Signed)
Addended by: Roxy Manns A on: 09/29/2022 12:53 PM   Modules accepted: Orders

## 2022-09-29 NOTE — Telephone Encounter (Signed)
I sent for DAW 1  Please let me know if I need to change anything

## 2022-09-29 NOTE — Telephone Encounter (Signed)
Patient called to discuss Welbutrin to be changed because $500 dollars is a lot of money she stated. Call back number [phone].

## 2022-09-29 NOTE — Telephone Encounter (Signed)
That is our only option for the daw , I agree that is a lot  If she is not open to the generic then please f/u and we can discuss options from a different class of medicine

## 2022-10-01 MED ORDER — WELLBUTRIN XL 150 MG PO TB24: 450.0000 mg | ORAL_TABLET | Freq: Every day | ORAL | 0 refills | Status: DC

## 2022-10-01 NOTE — Telephone Encounter (Signed)
Pt is afraid to change meds right here during holidays. Pt said that she would rather stay on med for now. Pt said that at 1st PCP was going to give her 3 tabs of the 150 mg and she is the one who asked for a 150mg  and 300 mg Rx. Her insurance is running both Rxs through like 2 separate Rxs with 2 different co-pays. Pt is asking if PCP can send in Name Brand Wellbutrin in for 3 tabs of the 150 mg so that her co-pay will at least be cut in half from $500 to $250 for a 3 month supply since it will be only one Rx not 2.  Pt said in the future she will get an appt scheduled to discuss alt meds since it is so expensive but given it's right here at the holidays she will pay the $250 for the 3 months and f/u with PCP before that refill is out to get alt meds set up  [facility] on file

## 2022-10-01 NOTE — Addendum Note (Signed)
Addended by: Roxy Manns A on: 10/01/2022 01:18 PM   Modules accepted: Orders

## 2022-10-01 NOTE — Addendum Note (Signed)
Addended by: Shon Millet on: 10/01/2022 12:52 PM   Modules accepted: Orders

## 2022-11-18 ENCOUNTER — Ambulatory Visit: Payer: Managed Care, Other (non HMO)

## 2022-12-29 NOTE — Progress Notes (Signed)
Cardiology Office Note   Date:  01/05/2023   ID:  Elizabeth Hayden, DOB 1971/02/16, MRN [id number]  PCP:  Abner Greenspan, MD  Cardiologist: Dr. Johnsie Cancel, MD   History of Present Illness:  52 y.o. f/u AVR for bicuspid AV and aortic root aneurysm   09/03/14  AVR with [facility]  Cath with no CAD prior to surgery    3.   Valve-sparing aortic root replacement with reimplantation of the native aortic valve and left and right coronary arteries ( 28 mm Gelweave valsalva graft). 4.   Replacement of ascending aortic aneurysm with a 28 mm Hemashield graft using deep hypothermic circulatory arrest.  Echo 09/20/18 with no significant AR/gradients normal EF and root Echo 11/21/21 EF normal no gradients/AR no coarctation gradients   Son Elizabeth Hayden has Down's Syndrome  Daughter age 44 works in Nutritional therapist in [place] working at [employer] in good shape   LDL has been as high as 171 prior to Rx Started on Crestor 5 mg 01/28/22  LDL improved to 72   [name] died of massive heart attack in February and did not have a will     Past Medical History:  Diagnosis Date   Aortic root dilation ([place])    Colon polyp 2020   Congenital heart disease    bicuspid aortic valve with coarctation repair 04/1974   Depression    Edema    Fibrocystic breast    GERD (gastroesophageal reflux disease)    takes Zantac daily as needed   HPV (human papilloma virus) infection    Seizures ([place])    as a baby and only 1 time d/t high fever   Shortness of breath dyspnea    with exertion   Sinus infection    Nov 16 and treated with antibiotic   TMJ (dislocation of temporomandibular joint)    Wears a night gear    UTI (lower urinary tract infection)    hx of    Yeast infection    08/17/14    Past Surgical History:  Procedure Laterality Date   ASCENDING AORTIC ROOT REPLACEMENT N/A 09/03/2014   Procedure: ASCENDING AORTIC ROOT REPLACEMENT & VALVE SPARING ROOT;  Surgeon: Gaye Pollack, MD;   Location: [place];  Service: Open Heart Surgery;  Laterality: N/A;  CIRC ARREST   COARCTATION OF AORTA EXCISION  1975   COLONOSCOPY  08/2019   [facility] in [place], repeat after 5 yrs   COLPOSCOPY  04/26/2015   CRYOTHERAPY  2009   EYE SURGERY     EYE SURGERY Bilateral 1985   INTRAOPERATIVE TRANSESOPHAGEAL ECHOCARDIOGRAM N/A 09/03/2014   Procedure: INTRAOPERATIVE TRANSESOPHAGEAL ECHOCARDIOGRAM;  Surgeon: Gaye Pollack, MD;  Location: [facility];  Service: Open Heart Surgery;  Laterality: N/A;   LEFT HEART CATHETERIZATION WITH CORONARY ANGIOGRAM N/A 08/06/2014   Procedure: LEFT HEART CATHETERIZATION WITH CORONARY ANGIOGRAM;  Surgeon: Josue Hector, MD;  Location: [facility] CATH LAB;  Service: Cardiovascular;  Laterality: N/A;   TEE WITHOUT CARDIOVERSION N/A 08/06/2014   Procedure: TRANSESOPHAGEAL ECHOCARDIOGRAM (TEE);  Surgeon: Josue Hector, MD;  [facility] ENDOSCOPY;  Service: Cardiovascular;  Laterality: N/A;     Current Outpatient Medications  Medication Sig Dispense Refill   Biotin w/ Vitamins C & E (HAIR SKIN & NAILS GUMMIES PO) Take 1 tablet by mouth daily.     CALCIUM-VITAMIN D PO Take 2 tablets by mouth daily.     metoprolol succinate (TOPROL-XL) 50 MG 24  hr tablet TAKE 1 TABLET BY MOUTH  DAILY WITH OR IMMEDIATELY  FOLLOWING A MEAL 90 tablet 3   rosuvastatin (CRESTOR) 5 MG tablet Take 1 tablet (5 mg total) by mouth daily. 90 tablet 3   WELLBUTRIN XL 150 MG 24 hr tablet TAKE 3 GABLETS BY MOUTH DAILY 270 tablet 0   No current facility-administered medications for this visit.    Allergies:   Ace inhibitors and Altace [ramipril]    Social History:  The patient  reports that she has never smoked. She has never used smokeless tobacco. She reports current alcohol use. She reports that she does not use drugs.   Family History:  The patient'sfamily history includes Breast cancer in her maternal grandmother; Cancer in her father; Celiac disease in her son; Diabetes in her father; Down syndrome in her  son; Hypertension in her father; Leukemia in her maternal grandmother; Ovarian cancer in her paternal grandmother.    ROS:  Please see the history of present illness. Otherwise, review of systems are positive for none.   All other systems are reviewed and negative.    PHYSICAL EXAM: VS:  There were no vitals taken for this visit. , BMI There is no height or weight on file to calculate BMI.   Affect appropriate Healthy:  appears stated age 53: normal Neck supple with no adenopathy JVP normal no bruits no thyromegaly Lungs clear with no wheezing and good diaphragmatic motion Heart:  S1/S2 SEM no AR  murmur, no rub, gallop or click PMI normal post sternotomy  Abdomen: benighn, BS positve, no tenderness, no AAA no bruit.  No HSM or HJR Distal pulses intact with no bruits No edema Neuro non-focal Skin warm and dry No muscular weakness     EKG:  SR rate 70 LAE LAD 08/11/19 09/17/20 SR rate 58 nonspecific ST changes   Recent Labs: 07/01/2022: ALT 18; BUN 19; Creatinine, Ser 1.01; Hemoglobin 13.0; Platelets 269; Potassium 4.6; Sodium 142; TSH 1.810    Lipid Panel    Component Value Date/Time   CHOL 149 07/01/2022 1619   TRIG 78 07/01/2022 1619   HDL 62 07/01/2022 1619   CHOLHDL 2.4 07/01/2022 1619   CHOLHDL 3.4 CALC 11/13/2008 1030   VLDL 9 11/13/2008 1030   LDLCALC 72 07/01/2022 1619      Wt Readings from Last 3 Encounters:  07/03/22 148 lb 9.6 oz (67.4 kg)  03/24/22 160 lb (72.6 kg)  11/05/21 163 lb (73.9 kg)    Other studies Reviewed: Additional studies/ records that were reviewed today include:   Echocardiogram 09/20/2018: Study Conclusions   - Left ventricle: The cavity size was normal. Wall thickness was   increased in a pattern of mild LVH. Systolic function was normal.   The estimated ejection fraction was in the range of 55% to 60%.   Wall motion was normal; there were no regional wall motion   abnormalities. Doppler parameters are consistent with  abnormal   left ventricular relaxation (grade 1 diastolic dysfunction). - Aorta: Stable aortic root size. Prior valve sparing aortic root   replacement. Reported coarctation repair visualized without   significant stenosis. - Mitral valve: Calcified annulus. Mildly thickened leaflets . - Left atrium: The atrium was normal in size. - Inferior vena cava: The vessel was normal in size. The   respirophasic diameter changes were in the normal range (>= 50%),   consistent with normal central venous pressure.   Impressions:   - Compared to a prior study in 2016, the  post surgical changes are   stable.     ASSESSMENT AND PLAN:  1.  Aortic aneurysm: -Post root replacement 08/2014 per Dr. Caffie Pinto -No symptoms -Continue beta-blocker - SBE prophylaxis   2.  Bicuspid AV: -post valve sparing AVR with root replacement Echo 09/2018  EF normal -  TTE 11/21/21 with no AR/AS and no high velocities suprasternal notch post coarctation repair   3.  Chronic edema: -Consider as needed diuretic  4. HLDL  Improved on crestor update labs   Current medicines are reviewed at length with the patient today.  The patient does not have concerns regarding medicines.  Labs/ tests ordered today include: Echo f/u AV repair with root replacement   No orders of the defined types were placed in this encounter.   Disposition:   FU  In a year    Signed, Jenkins Rouge, MD  01/05/2023 8:56 AM    [facility] [address], [place], [place]  [address] Phone: [phone]; Fax: [phone]

## 2022-12-30 ENCOUNTER — Other Ambulatory Visit: Payer: Self-pay | Admitting: Family Medicine

## 2022-12-31 ENCOUNTER — Ambulatory Visit
Admission: RE | Admit: 2022-12-31 | Discharge: 2022-12-31 | Disposition: A | Discharge status: Home / Self Care | Payer: Managed Care, Other (non HMO) | Source: Ambulatory Visit | Attending: Obstetrics and Gynecology | Admitting: Obstetrics and Gynecology

## 2022-12-31 DIAGNOSIS — Z1231 Encounter for screening mammogram for malignant neoplasm of breast: Secondary | ICD-10-CM

## 2023-01-05 ENCOUNTER — Ambulatory Visit: Payer: Managed Care, Other (non HMO) | Attending: Cardiovascular Disease | Admitting: Cardiovascular Disease

## 2023-01-05 ENCOUNTER — Encounter: Payer: Self-pay | Admitting: Cardiovascular Disease

## 2023-01-05 VITALS — BP 140/76 | HR 58 | Ht 68.0 in | Wt 150.4 lb

## 2023-01-05 DIAGNOSIS — E782 Mixed hyperlipidemia: Secondary | ICD-10-CM

## 2023-01-05 DIAGNOSIS — Q231 Congenital insufficiency of aortic valve: Secondary | ICD-10-CM

## 2023-01-05 DIAGNOSIS — I359 Nonrheumatic aortic valve disorder, unspecified: Secondary | ICD-10-CM

## 2023-01-05 NOTE — Patient Instructions (Signed)
Medication Instructions:  Your physician recommends that you continue on your current medications as directed. Please refer to the Current Medication list given to you today.  *If you need a refill on your cardiac medications before your next appointment, please call your pharmacy*  Lab Work: If you have labs (blood work) drawn today and your tests are completely normal, you will receive your results only by: MyChart Message (if you have MyChart) OR A paper copy in the mail If you have any lab test that is abnormal or we need to change your treatment, we will call you to review the results.  Testing/Procedures: None ordered today.  Follow-Up: At [facility], you and your health needs are our priority.  As part of our continuing mission to provide you with exceptional heart care, we have created designated Provider Care Teams.  These Care Teams include your primary Cardiologist (physician) and Advanced Practice Providers (APPs -  Physician Assistants and Nurse Practitioners) who all work together to provide you with the care you need, when you need it.  We recommend signing up for the patient portal called "MyChart".  Sign up information is provided on this After Visit Summary.  MyChart is used to connect with patients for Virtual Visits (Telemedicine).  Patients are able to view lab/test results, encounter notes, upcoming appointments, etc.  Non-urgent messages can be sent to your provider as well.   To learn more about what you can do with MyChart, go to [email or web address].    Your next appointment:   1 year(s)  Provider:   Peter Nishan, MD      

## 2023-01-10 ENCOUNTER — Other Ambulatory Visit: Payer: Self-pay | Admitting: Cardiovascular Disease

## 2023-01-14 ENCOUNTER — Other Ambulatory Visit: Payer: Self-pay | Admitting: Neurology

## 2023-01-14 DIAGNOSIS — R42 Dizziness and giddiness: Secondary | ICD-10-CM

## 2023-01-14 DIAGNOSIS — G43019 Migraine without aura, intractable, without status migrainosus: Secondary | ICD-10-CM

## 2023-02-02 ENCOUNTER — Ambulatory Visit
Admission: RE | Admit: 2023-02-02 | Discharge: 2023-02-02 | Disposition: A | Discharge status: Home / Self Care | Payer: Managed Care, Other (non HMO) | Source: Ambulatory Visit | Attending: Neurology | Admitting: Neurology

## 2023-02-02 DIAGNOSIS — G43019 Migraine without aura, intractable, without status migrainosus: Secondary | ICD-10-CM

## 2023-02-02 DIAGNOSIS — R42 Dizziness and giddiness: Secondary | ICD-10-CM

## 2023-02-02 MED ORDER — GADOPICLENOL 0.5 MMOL/ML IV SOLN
7.0000 mL | Freq: Once | INTRAVENOUS | Status: AC | PRN
  Administered 2023-02-02: 7 mL via INTRAVENOUS

## 2023-04-02 ENCOUNTER — Other Ambulatory Visit: Payer: Self-pay | Admitting: Family Medicine

## 2023-04-08 ENCOUNTER — Encounter: Payer: Self-pay | Admitting: Family Medicine

## 2023-04-08 LAB — LAB REPORT - SCANNED: A1c: 5.9

## 2023-04-08 NOTE — Telephone Encounter (Signed)
Copy of results placed in your inbox for review

## 2023-05-07 ENCOUNTER — Encounter: Payer: Self-pay | Admitting: Family Medicine

## 2023-05-07 ENCOUNTER — Ambulatory Visit: Payer: Managed Care, Other (non HMO) | Admitting: Family Medicine

## 2023-05-07 VITALS — BP 132/80 | HR 53 | Temp 97.9°F | Ht 68.0 in | Wt 146.0 lb

## 2023-05-07 DIAGNOSIS — N951 Menopausal and female climacteric states: Secondary | ICD-10-CM | POA: Diagnosis not present

## 2023-05-07 DIAGNOSIS — F430 Acute stress reaction: Secondary | ICD-10-CM | POA: Diagnosis not present

## 2023-05-07 DIAGNOSIS — N912 Amenorrhea, unspecified: Secondary | ICD-10-CM | POA: Insufficient documentation

## 2023-05-07 MED ORDER — GABAPENTIN 100 MG PO CAPS: 100.0000 mg | ORAL_CAPSULE | Freq: Two times a day (BID) | ORAL | 3 refills | Status: DC

## 2023-05-07 NOTE — Assessment & Plan Note (Signed)
Irregular menses None since April  Suspect perimenopause or menopause at this point  [facility], LH and TSH ordered  Discussed treatment options (no hrt due to cardiac history) For hot flashes discussed opt of gabapentin, ssri or veozah and discussed pros/cons of each Will start gabapentin (discussed possible side effects in detail) 100 mg at bedtime , after a week bid  Then update with progress  Call back and Er precautions noted in detail today   Emotional symptoms may be also due to recent life stressors, counseling referral offered

## 2023-05-07 NOTE — Assessment & Plan Note (Signed)
Recently lost her ex husband, lost job and in midst of hormonal change Continues wellbutrin  Will try low dose gabapentin for sleep and hot flashes Offered counseling referral  Has good coping skills

## 2023-05-07 NOTE — Progress Notes (Signed)
Subjective:    Patient ID: Elizabeth Hayden, female    DOB: 20-Jun-1971, 52 y.o.   MRN: [id number]  HPI  Wt Readings from Last 3 Encounters:  05/07/23 146 lb (66.2 kg)  01/05/23 150 lb 6.4 oz (68.2 kg)  07/03/22 148 lb 9.6 oz (67.4 kg)   22.20 kg/m  Vitals:   05/07/23 1246  BP: 132/80  Pulse: (!) 53  Temp: 97.9 F (36.6 C)  SpO2: 99%     Pt presents to discuss menopausal symptoms   Hot flashes with sweats  Day and night   Some mood changes but also lost her ex husband in [place] / grief has been hard  He died withough a will / has disabled son  Also told she will be losing her job    Not sleeping well   Cannot take hormones safely due to heart issues   LMP was in April   Was irregular before then   - 3-6 mo in between   Not sexually active   Hast not tried- Ssri Gabapentin  Veozah   Lab Results  Component Value Date   TSH 1.810 07/01/2022   Has annual exam planned for sept    Patient Active Problem List   Diagnosis Date Noted   Symptoms, such as flushing, sleeplessness, headache, lack of concentration, associated with the menopause 05/07/2023   Amenorrhea 05/07/2023   Recurrent UTI 06/26/2020   History of abnormal cervical Pap smear 08/23/2019   Colon cancer screening 06/15/2019   Hyperlipidemia 06/15/2019   Elevated glucose level 03/25/2018   Family history of ovarian cancer 06/08/2017   Family history of breast cancer 06/08/2017   H/O fracture of wrist 06/02/2017   Coarctation of aorta 11/24/2011   Aortic aneurysm ([facility]) 11/24/2011   Gynecological examination 05/19/2011   Routine general medical examination at a health care facility 05/19/2011   History of eating disorder 11/02/2007   Dysthymia 11/02/2007   CARDIOMYOPATHY, HYPERTROPHIC, OBSTRUCTIVE 11/02/2007   Allergic rhinitis 11/02/2007   BICUSPID AORTIC VALVE 11/02/2007   Past Medical History:  Diagnosis Date   Aortic root dilation ([facility])    Colon polyp 2020   Congenital heart disease     bicuspid aortic valve with coarctation repair 04/1974   Depression    Edema    Fibrocystic breast    GERD (gastroesophageal reflux disease)    takes Zantac daily as needed   HPV (human papilloma virus) infection    Seizures ([facility])    as a baby and only 1 time d/t high fever   Shortness of breath dyspnea    with exertion   Sinus infection    Nov 16 and treated with antibiotic   TMJ (dislocation of temporomandibular joint)    Wears a night gear    UTI (lower urinary tract infection)    hx of    Yeast infection    08/17/14   Past Surgical History:  Procedure Laterality Date   ASCENDING AORTIC ROOT REPLACEMENT N/A 09/03/2014   Procedure: ASCENDING AORTIC ROOT REPLACEMENT & VALVE SPARING ROOT;  Surgeon: Alleen Borne, MD;  Location: [facility];  Service: Open Heart Surgery;  Laterality: N/A;  CIRC ARREST   COARCTATION OF AORTA EXCISION  1975   COLONOSCOPY  08/2019   [facility] in [place], repeat after 5 yrs   COLPOSCOPY  04/26/2015   CRYOTHERAPY  2009   EYE SURGERY     EYE SURGERY Bilateral 1985   INTRAOPERATIVE TRANSESOPHAGEAL ECHOCARDIOGRAM N/A 09/03/2014   Procedure:  INTRAOPERATIVE TRANSESOPHAGEAL ECHOCARDIOGRAM;  Surgeon: Alleen Borne, MD;  [facility] OR;  Service: Open Heart Surgery;  Laterality: N/A;   LEFT HEART CATHETERIZATION WITH CORONARY ANGIOGRAM N/A 08/06/2014   Procedure: LEFT HEART CATHETERIZATION WITH CORONARY ANGIOGRAM;  Surgeon: Wendall Stade, MD;  Location: [facility] CATH LAB;  Service: Cardiovascular;  Laterality: N/A;   TEE WITHOUT CARDIOVERSION N/A 08/06/2014   Procedure: TRANSESOPHAGEAL ECHOCARDIOGRAM (TEE);  Surgeon: Wendall Stade, MD;  Location: [facility] ENDOSCOPY;  Service: Cardiovascular;  Laterality: N/A;   Social History   Tobacco Use   Smoking status: Never   Smokeless tobacco: Never  Vaping Use   Vaping status: Never Used  Substance Use Topics   Alcohol use: Yes    Alcohol/week: 0.0 standard drinks of alcohol    Comment: occasional   Drug use: No   Family  History  Problem Relation Age of Onset   Hypertension Father    Diabetes Father        DM type 2   Cancer Father        lung    Breast cancer Maternal Grandmother        30s   Leukemia Maternal Grandmother    Ovarian cancer Paternal Grandmother        3s, malignant   Celiac disease Son    Down syndrome Son    Allergies  Allergen Reactions   Ace Inhibitors     REACTION: rash and cough   Altace [Ramipril] Hives and Itching   Current Outpatient Medications on File Prior to Visit  Medication Sig Dispense Refill   Biotin w/ Vitamins C & E (HAIR SKIN & NAILS GUMMIES PO) Take 1 tablet by mouth daily.     CALCIUM-VITAMIN D PO Take 2 tablets by mouth daily.     metoprolol succinate (TOPROL-XL) 50 MG 24 hr tablet TAKE 1 TABLET BY MOUTH DAILY  WITH OR IMMEDIATELY FOLLOWING A  MEAL 90 tablet 3   rosuvastatin (CRESTOR) 5 MG tablet TAKE 1 TABLET BY MOUTH DAILY 90 tablet 3   WELLBUTRIN XL 150 MG 24 hr tablet TAKE 3 TABLETS BY MOUTH DAILY 270 tablet 0   No current facility-administered medications on file prior to visit.    Review of Systems  Constitutional:  Positive for fatigue. Negative for activity change, appetite change, fever and unexpected weight change.  HENT:  Negative for congestion, ear pain, rhinorrhea, sinus pressure and sore throat.   Eyes:  Negative for pain, redness and visual disturbance.  Respiratory:  Negative for cough, shortness of breath and wheezing.   Cardiovascular:  Negative for chest pain and palpitations.  Gastrointestinal:  Negative for abdominal pain, blood in stool, constipation and diarrhea.  Endocrine: Positive for heat intolerance. Negative for polydipsia and polyuria.  Genitourinary:  Negative for dysuria, frequency and urgency.  Musculoskeletal:  Negative for arthralgias, back pain and myalgias.  Skin:  Negative for pallor and rash.  Allergic/Immunologic: Negative for environmental allergies.  Neurological:  Negative for dizziness, syncope and  headaches.  Hematological:  Negative for adenopathy. Does not bruise/bleed easily.  Psychiatric/Behavioral:  Positive for sleep disturbance. Negative for decreased concentration and dysphoric mood. The patient is nervous/anxious.        Objective:   Physical Exam Constitutional:      General: She is not in acute distress.    Appearance: Normal appearance. She is normal weight. She is not ill-appearing.  HENT:     Head: Normocephalic and atraumatic.  Cardiovascular:     Rate and Rhythm:  Normal rate and regular rhythm.  Pulmonary:     Effort: Pulmonary effort is normal. No respiratory distress.  Skin:    General: Skin is warm and dry.     Coloration: Skin is not jaundiced or pale.  Neurological:     Mental Status: She is alert.     Cranial Nerves: No cranial nerve deficit.     Comments: No tremor   Psychiatric:        Mood and Affect: Mood normal.           Assessment & Plan:   Problem List Items Addressed This Visit       Other   Symptoms, such as flushing, sleeplessness, headache, lack of concentration, associated with the menopause - Primary    Irregular menses None since April  Suspect perimenopause or menopause at this point  FSH, LH and TSH ordered  Discussed treatment options (no hrt due to cardiac history) For hot flashes discussed opt of gabapentin, ssri or veozah and discussed pros/cons of each Will start gabapentin (discussed possible side effects in detail) 100 mg at bedtime , after a week bid  Then update with progress  Call back and Er precautions noted in detail today   Emotional symptoms may be also due to recent life stressors, counseling referral offered       Relevant Orders   F[place] Stimulating Hormone   Luteinizing hormone   TSH   Amenorrhea    TSH, FSH, LH ordered Not sexually active Suspect menopause or peri menopause based on symptoms       Relevant Orders   Follicle Stimulating Hormone   Luteinizing hormone   TSH

## 2023-05-07 NOTE — Assessment & Plan Note (Signed)
TSH, FSH, LH ordered Not sexually active Suspect menopause or peri menopause based on symptoms

## 2023-05-07 NOTE — Patient Instructions (Signed)
Labs today   Try gabapentin 100 mg at bedtime If tolerated well in a week take it am and pm   Then a week later message me to let me know how you are and if it helps yet    If severely dizzy or sleepy stop it If mild / keep [place] posted

## 2023-05-27 ENCOUNTER — Encounter (INDEPENDENT_AMBULATORY_CARE_PROVIDER_SITE_OTHER): Payer: Self-pay

## 2023-05-31 ENCOUNTER — Encounter: Payer: Self-pay | Admitting: Family Medicine

## 2023-06-07 MED ORDER — GABAPENTIN 100 MG PO CAPS: ORAL_CAPSULE | ORAL | 2 refills | Status: DC

## 2023-06-18 ENCOUNTER — Other Ambulatory Visit: Payer: Self-pay

## 2023-06-18 NOTE — Progress Notes (Signed)
Pt cleared pre-employment UDS. HR notified.  

## 2023-06-21 ENCOUNTER — Other Ambulatory Visit: Payer: Self-pay | Admitting: Family Medicine

## 2023-07-01 ENCOUNTER — Encounter: Payer: Self-pay | Admitting: Family Medicine

## 2023-07-01 DIAGNOSIS — R7309 Other abnormal glucose: Secondary | ICD-10-CM

## 2023-07-01 DIAGNOSIS — Z0000 Encounter for general adult medical examination without abnormal findings: Secondary | ICD-10-CM

## 2023-07-01 DIAGNOSIS — E7800 Pure hypercholesterolemia, unspecified: Secondary | ICD-10-CM

## 2023-07-01 NOTE — Telephone Encounter (Signed)
The orders are in for [employer] lab collect, future

## 2023-07-03 LAB — CBC WITH DIFFERENTIAL/PLATELET
Basophils Absolute: 0 10*3/uL (ref 0.0–0.2)
Basos: 1 %
EOS (ABSOLUTE): 0.2 10*3/uL (ref 0.0–0.4)
Eos: 3 %
Hematocrit: 41.9 % (ref 34.0–46.6)
Hemoglobin: 13.4 g/dL (ref 11.1–15.9)
Immature Grans (Abs): 0 10*3/uL (ref 0.0–0.1)
Immature Granulocytes: 0 %
Lymphocytes Absolute: 2.1 10*3/uL (ref 0.7–3.1)
Lymphs: 38 %
MCH: 30.3 pg (ref 26.6–33.0)
MCHC: 32 g/dL (ref 31.5–35.7)
MCV: 95 fL (ref 79–97)
Monocytes Absolute: 0.6 10*3/uL (ref 0.1–0.9)
Monocytes: 11 %
Neutrophils Absolute: 2.5 10*3/uL (ref 1.4–7.0)
Neutrophils: 47 %
Platelets: 256 10*3/uL (ref 150–450)
RBC: 4.42 x10E6/uL (ref 3.77–5.28)
RDW: 12.6 % (ref 11.7–15.4)
WBC: 5.4 10*3/uL (ref 3.4–10.8)

## 2023-07-03 LAB — COMPREHENSIVE METABOLIC PANEL
ALT: 20 IU/L (ref 0–32)
AST: 29 IU/L (ref 0–40)
Albumin: 4.4 g/dL (ref 3.8–4.9)
Alkaline Phosphatase: 73 IU/L (ref 44–121)
BUN/Creatinine Ratio: 19 (ref 9–23)
BUN: 18 mg/dL (ref 6–24)
Bilirubin Total: 0.4 mg/dL (ref 0.0–1.2)
CO2: 26 mmol/L (ref 20–29)
Calcium: 9.6 mg/dL (ref 8.7–10.2)
Chloride: 101 mmol/L (ref 96–106)
Creatinine, Ser: 0.96 mg/dL (ref 0.57–1.00)
Globulin, Total: 2.7 g/dL (ref 1.5–4.5)
Glucose: 113 mg/dL — ABNORMAL HIGH (ref 70–99)
Potassium: 4.2 mmol/L (ref 3.5–5.2)
Sodium: 139 mmol/L (ref 134–144)
Total Protein: 7.1 g/dL (ref 6.0–8.5)
eGFR: 70 mL/min/{1.73_m2} (ref >59)

## 2023-07-03 LAB — HEMOGLOBIN A1C
Est. average glucose Bld gHb Est-mCnc: 114 mg/dL
Hgb A1c MFr Bld: 5.6 % (ref 4.8–5.6)

## 2023-07-03 LAB — TSH: TSH: 2.26 u[IU]/mL (ref 0.450–4.500)

## 2023-07-03 LAB — LIPID PANEL
Chol/HDL Ratio: 2.5 ratio (ref 0.0–4.4)
Cholesterol, Total: 171 mg/dL (ref 100–199)
HDL: 69 mg/dL (ref >39)
LDL Chol Calc (NIH): 88 mg/dL (ref 0–99)
Triglycerides: 77 mg/dL (ref 0–149)
VLDL Cholesterol Cal: 14 mg/dL (ref 5–40)

## 2023-07-06 NOTE — Progress Notes (Unsigned)
Subjective:    Patient ID: Elizabeth Hayden, female    DOB: [birth year], 52 y.o.   MRN: [id number]  HPI  Here for health maintenance exam and to review chronic medical problems   Wt Readings from Last 3 Encounters:  07/07/23 148 lb 6 oz (67.3 kg)  05/07/23 146 lb (66.2 kg)  01/05/23 150 lb 6.4 oz (68.2 kg)   22.56 kg/m  Vitals:   07/07/23 0854  BP: 118/76  Pulse: 65  Temp: 97.8 F (36.6 C)  SpO2: 100%    Immunization History  Administered Date(s) Administered   H1N1 11/13/2008   Hepatitis A, Adult 04/23/2017   Influenza Split 07/25/2012   Influenza Whole 07/15/2007   Influenza, Seasonal, Injecte, Preservative Fre 07/07/2023   Influenza,inj,Quad PF,6+ Mos 08/01/2013, 09/08/2014, 07/28/2017, 08/06/2018, 06/23/2019, 07/03/2022   Janssen (J&J) SARS-COV-2 Vaccination 01/17/2020   Td 04/18/2004   Tdap 10/19/2014    Health Maintenance Due  Topic Date Due   Hepatitis C Screening  Never done   Zoster Vaccines- Shingrix (1 of 2) Never done   Shingrix-had last year    Flu vaccine -today    Mammogram  12/2022 Self breast exam- is lumpy in general/nothing new  Fibrocystic breast   Wants breast exam today  Gyn health Pap 03/2022 -normal with neg HPV at gyn   Has taken gabapentin for menopausal symptoms  Helping a little  Wants to go up on pm dose    Colon cancer screening -colonoscopy 04/2022   Bone health   Falls-none  Fractures-none  Supplements -ca and D  Exercise : zumba 3 times per week  One HIIT or muscle pump class weekly   Some low sugar/high protein shakes when she cannot get a meal    Mood    07/07/2023    9:39 AM 05/07/2023   12:48 PM 07/03/2022    9:38 AM 07/01/2021    9:53 AM 06/26/2020    3:48 PM  Depression screen PHQ 2/9  Decreased Interest 1 0 0 1 0  Down, Depressed, Hopeless 1 1 1 1 1   PHQ - 2 Score 2 1 1 2 1   Altered sleeping 1 2 1 2  0  Tired, decreased energy 2 1 1 1  0  Change in appetite 2 0 1 2 0  Feeling bad or failure about  yourself  1 1 2 2 1   Trouble concentrating 1 0 2 1 0  Moving slowly or fidgety/restless 0 0 1 0 0  Suicidal thoughts 0 0 1 1 0  PHQ-9 Score 9 5 10 11 2   Difficult doing work/chores Somewhat difficult Not difficult at all Not difficult at all Somewhat difficult Not difficult at all   Takes wellbutrin xl 450 mg daily for mood  Does not tolerate the generic    (when the generic changes her depression/ anxiety go crazy) Pays for the daw   Also on back order   Losing insurance and job soon  Will have new job and benefits starting in November  Trying to use her flex card for the last fill but her pharmacy cannot get it   One pharmacy may have it in a few days    Under care of aortic aneurysm and cardiomyopathy by cardiology    Elevated glucose level Lab Results  Component Value Date   HGBA1C 5.6 07/02/2023   Hyperlipidemia Lab Results  Component Value Date   CHOL 171 07/02/2023   CHOL 149 07/01/2022   CHOL 144 01/23/2022   Lab Results  Component Value Date   HDL 69 07/02/2023   HDL 62 07/01/2022   HDL 58 01/23/2022   Lab Results  Component Value Date   LDLCALC 88 07/02/2023   LDLCALC 72 07/01/2022   LDLCALC 75 01/23/2022   Lab Results  Component Value Date   TRIG 77 07/02/2023   TRIG 78 07/01/2022   TRIG 50 01/23/2022   Lab Results  Component Value Date   CHOLHDL 2.5 07/02/2023   CHOLHDL 2.4 07/01/2022   CHOLHDL 2.5 01/23/2022   No results found for: "LDLDIRECT" Crestor 5 mg daily     Other labs Lab Results  Component Value Date   NA 139 07/02/2023   K 4.2 07/02/2023   CO2 26 07/02/2023   GLUCOSE 113 (H) 07/02/2023   BUN 18 07/02/2023   CREATININE 0.96 07/02/2023   CALCIUM 9.6 07/02/2023   EGFR 70 07/02/2023   GFRNONAA 68 06/19/2020    Lab Results  Component Value Date   ALT 20 07/02/2023   AST 29 07/02/2023   ALKPHOS 73 07/02/2023   BILITOT 0.4 07/02/2023   Lab Results  Component Value Date   WBC 5.4 07/02/2023   HGB 13.4 07/02/2023    HCT 41.9 07/02/2023   MCV 95 07/02/2023   PLT 256 07/02/2023   Lab Results  Component Value Date   TSH 2.260 07/02/2023       Patient Active Problem List   Diagnosis Date Noted   Right shoulder pain 07/07/2023   Symptoms, such as flushing, sleeplessness, headache, lack of concentration, associated with the menopause 05/07/2023   Amenorrhea 05/07/2023   Stress reaction 05/07/2023   Recurrent UTI 06/26/2020   History of abnormal cervical Pap smear 08/23/2019   Colon cancer screening 06/15/2019   Hyperlipidemia 06/15/2019   Elevated glucose level 03/25/2018   Family history of ovarian cancer 06/08/2017   Family history of breast cancer 06/08/2017   H/O fracture of wrist 06/02/2017   Coarctation of aorta 11/24/2011   Aortic aneurysm (HCC) 11/24/2011   Gynecological examination 05/19/2011   Routine general medical examination at a health care facility 05/19/2011   History of eating disorder 11/02/2007   Dysthymia 11/02/2007   CARDIOMYOPATHY, HYPERTROPHIC, OBSTRUCTIVE 11/02/2007   Allergic rhinitis 11/02/2007   BICUSPID AORTIC VALVE 11/02/2007   Past Medical History:  Diagnosis Date   Aortic root dilation (HCC)    Colon polyp 2020   Congenital heart disease    bicuspid aortic valve with coarctation repair 04/1974   Depression    Edema    Fibrocystic breast    GERD (gastroesophageal reflux disease)    takes Zantac daily as needed   HPV (human papilloma virus) infection    Seizures (HCC)    as a baby and only 1 time d/t high fever   Shortness of breath dyspnea    with exertion   Sinus infection    Nov 16 and treated with antibiotic   TMJ (dislocation of temporomandibular joint)    Wears a night gear    UTI (lower urinary tract infection)    hx of    Yeast infection    08/17/14   Past Surgical History:  Procedure Laterality Date   ASCENDING AORTIC ROOT REPLACEMENT N/A 09/03/2014   Procedure: ASCENDING AORTIC ROOT REPLACEMENT & VALVE SPARING ROOT;  Surgeon: Alleen Borne, MD;  Location: [facility] OR;  Service: Open Heart Surgery;  Laterality: N/A;  CIRC ARREST   COARCTATION OF AORTA EXCISION  1975   COLONOSCOPY  08/2019  [facility] in [place], repeat after 5 yrs   COLPOSCOPY  04/26/2015   CRYOTHERAPY  2009   EYE SURGERY     EYE SURGERY Bilateral 1985   INTRAOPERATIVE TRANSESOPHAGEAL ECHOCARDIOGRAM N/A 09/03/2014   Procedure: INTRAOPERATIVE TRANSESOPHAGEAL ECHOCARDIOGRAM;  Surgeon: Alleen Borne, MD;  Location: [facility] OR;  Service: Open Heart Surgery;  Laterality: N/A;   LEFT HEART CATHETERIZATION WITH CORONARY ANGIOGRAM N/A 08/06/2014   Procedure: LEFT HEART CATHETERIZATION WITH CORONARY ANGIOGRAM;  Surgeon: Wendall Stade, MD;  Location: [facility] CATH LAB;  Service: Cardiovascular;  Laterality: N/A;   TEE WITHOUT CARDIOVERSION N/A 08/06/2014   Procedure: TRANSESOPHAGEAL ECHOCARDIOGRAM (TEE);  Surgeon: Wendall Stade, MD;  Location: [facility] ENDOSCOPY;  Service: Cardiovascular;  Laterality: N/A;   Social History   Tobacco Use   Smoking status: Never   Smokeless tobacco: Never  Vaping Use   Vaping status: Never Used  Substance Use Topics   Alcohol use: Yes    Alcohol/week: 0.0 standard drinks of alcohol    Comment: occasional   Drug use: No   Family History  Problem Relation Age of Onset   Hypertension Father    Diabetes Father        DM type 2   Cancer Father        lung    Breast cancer Maternal Grandmother        30s   Leukemia Maternal Grandmother    Ovarian cancer Paternal Grandmother        4s, malignant   Celiac disease Son    Down syndrome Son    Allergies  Allergen Reactions   Ace Inhibitors     REACTION: rash and cough   Altace [Ramipril] Hives and Itching   Current Outpatient Medications on File Prior to Visit  Medication Sig Dispense Refill   Biotin w/ Vitamins C & E (HAIR SKIN & NAILS GUMMIES PO) Take 1 tablet by mouth daily.     CALCIUM-VITAMIN D PO Take 2 tablets by mouth daily.     metoprolol succinate (TOPROL-XL) 50 MG 24 hr tablet  TAKE 1 TABLET BY MOUTH DAILY  WITH OR IMMEDIATELY FOLLOWING A  MEAL 90 tablet 3   rosuvastatin (CRESTOR) 5 MG tablet TAKE 1 TABLET BY MOUTH DAILY 90 tablet 3   WELLBUTRIN XL 150 MG 24 hr tablet TAKE 3 TABLETS BY MOUTH DAILY 270 tablet 0   No current facility-administered medications on file prior to visit.    Review of Systems  Constitutional:  Negative for activity change, appetite change, fatigue, fever and unexpected weight change.  HENT:  Negative for congestion, ear pain, rhinorrhea, sinus pressure and sore throat.   Eyes:  Negative for pain, redness and visual disturbance.  Respiratory:  Negative for cough, shortness of breath and wheezing.   Cardiovascular:  Negative for chest pain and palpitations.  Gastrointestinal:  Negative for abdominal pain, blood in stool, constipation and diarrhea.  Endocrine: Negative for polydipsia and polyuria.  Genitourinary:  Negative for dysuria, frequency and urgency.  Musculoskeletal:  Negative for arthralgias, back pain and myalgias.       Right shoulder pain - thinks it is bursitis    Skin:  Negative for pallor and rash.  Allergic/Immunologic: Negative for environmental allergies.  Neurological:  Negative for dizziness, syncope and headaches.  Hematological:  Negative for adenopathy. Does not bruise/bleed easily.  Psychiatric/Behavioral:  Negative for decreased concentration and dysphoric mood. The patient is not nervous/anxious.        Stress level is often high  Objective:   Physical Exam Constitutional:      General: She is not in acute distress.    Appearance: Normal appearance. She is well-developed and normal weight. She is not ill-appearing or diaphoretic.  HENT:     Head: Normocephalic and atraumatic.     Right Ear: Tympanic membrane, ear canal and external ear normal.     Left Ear: Tympanic membrane, ear canal and external ear normal.     Nose: Nose normal. No congestion.     Mouth/Throat:     Mouth: Mucous membranes are  moist.     Pharynx: Oropharynx is clear. No posterior oropharyngeal erythema.  Eyes:     General: No scleral icterus.    Extraocular Movements: Extraocular movements intact.     Conjunctiva/sclera: Conjunctivae normal.     Pupils: Pupils are equal, round, and reactive to light.  Neck:     Thyroid: No thyromegaly.     Vascular: No carotid bruit or JVD.  Cardiovascular:     Rate and Rhythm: Normal rate and regular rhythm.     Pulses: Normal pulses.     Heart sounds: Normal heart sounds.     No gallop.     Comments: Baseline systolic click heard Pulmonary:     Effort: Pulmonary effort is normal. No respiratory distress.     Breath sounds: Normal breath sounds. No wheezing.     Comments: Good air exch Chest:     Chest wall: No tenderness.  Abdominal:     General: Bowel sounds are normal. There is no distension or abdominal bruit.     Palpations: Abdomen is soft. There is no mass.     Tenderness: There is no abdominal tenderness.     Hernia: No hernia is present.  Genitourinary:    Comments: Breast exam: No mass, nodules, thickening, tenderness, bulging, retraction, inflamation, nipple discharge or skin changes noted.  No axillary or clavicular LA.     Musculoskeletal:        General: No tenderness. Normal range of motion.     Cervical back: Normal range of motion and neck supple. No rigidity. No muscular tenderness.     Right lower leg: No edema.     Left lower leg: No edema.     Comments: No kyphosis   Lymphadenopathy:     Cervical: No cervical adenopathy.  Skin:    General: Skin is warm and dry.     Coloration: Skin is not pale.     Findings: No erythema or rash.     Comments: Solar lentigines diffusely   Neurological:     Mental Status: She is alert. Mental status is at baseline.     Cranial Nerves: No cranial nerve deficit.     Motor: No abnormal muscle tone.     Coordination: Coordination normal.     Gait: Gait normal.     Deep Tendon Reflexes: Reflexes are normal  and symmetric. Reflexes normal.  Psychiatric:        Mood and Affect: Mood normal.        Cognition and Memory: Cognition and memory normal.           Assessment & Plan:   Problem List Items Addressed This Visit       Cardiovascular and Mediastinum   Aortic aneurysm (HCC)    Continues care from cardiology  Lipids controlled with crestor  No clinical changes or symptms         Other   Colon cancer screening  Colonoscopy 04/2022       Dysthymia    Pt is stable as long as she takes wellbutrin xl 450 mg daily  In the past mood changes greatly when generic is changed so she gets it daw  This has been hard to get and pay for (on back order now she thinks)  Will also be changing ins soon  Using up a bene card   Wants to see if a generic forfivo 450 mg xl would be available (given printed prescription to take to several pharmacies to see if available0  If not will inquire with pharmacist re: how to do the other   Good self care Stress level stays high also       Relevant Medications   buPROPion HCl ER, XL, 450 MG TB24   Elevated glucose level    Lab Results  Component Value Date   HGBA1C 5.6 07/02/2023   Fasting glucose 113 Eating more sweets lately  Will continue to monitor disc imp of low glycemic diet and wt loss to prevent DM2        Hyperlipidemia    Disc goals for lipids and reasons to control them Rev last labs with pt Rev low sat fat diet in detail   Conrolled  LDL 88  On crestor 5 mg daily      Right shoulder pain    Pt suspects bursitis Offered ref to Dr [employer] [facility] in future if she wants to be seen       Routine general medical examination at a health care facility - Primary    Reviewed health habits including diet and exercise and skin cancer prevention Reviewed appropriate screening tests for age  Also reviewed health mt list, fam hx and immunization status , as well as social and family history   See HPI Labs reviewed and  ordered Flu vaccine today  Had shingrix series last year Mammogram utd 12/2022 Pap utd 03/2022 from gyn  Colonoscopy utd 04/2022  Discussed fall prevention, supplements and exercise for bone density   PHQ 5 - having trouble getting her antidepressant       Symptoms, such as flushing, sleeplessness, headache, lack of concentration, associated with the menopause    Gabapentin is helping some 100 am and 200 pm  Would like to increase her pm dose Send in 300 mg for pm  Watch for sedation   Will update if not helpful      Other Visit Diagnoses     Need for influenza vaccination       Relevant Orders   Flu vaccine trivalent PF, 6mos and older(Flulaval,Afluria,Fluarix,Fluzone) (Completed)

## 2023-07-07 ENCOUNTER — Encounter: Payer: Self-pay | Admitting: Family Medicine

## 2023-07-07 ENCOUNTER — Ambulatory Visit (INDEPENDENT_AMBULATORY_CARE_PROVIDER_SITE_OTHER): Payer: Managed Care, Other (non HMO) | Admitting: Family Medicine

## 2023-07-07 VITALS — BP 118/76 | HR 65 | Temp 97.8°F | Ht 68.0 in | Wt 148.4 lb

## 2023-07-07 DIAGNOSIS — M25511 Pain in right shoulder: Secondary | ICD-10-CM | POA: Insufficient documentation

## 2023-07-07 DIAGNOSIS — I712 Thoracic aortic aneurysm, without rupture, unspecified: Secondary | ICD-10-CM

## 2023-07-07 DIAGNOSIS — R7309 Other abnormal glucose: Secondary | ICD-10-CM

## 2023-07-07 DIAGNOSIS — Z23 Encounter for immunization: Secondary | ICD-10-CM

## 2023-07-07 DIAGNOSIS — Z0000 Encounter for general adult medical examination without abnormal findings: Secondary | ICD-10-CM

## 2023-07-07 DIAGNOSIS — E7800 Pure hypercholesterolemia, unspecified: Secondary | ICD-10-CM | POA: Diagnosis not present

## 2023-07-07 DIAGNOSIS — Z1211 Encounter for screening for malignant neoplasm of colon: Secondary | ICD-10-CM

## 2023-07-07 DIAGNOSIS — N951 Menopausal and female climacteric states: Secondary | ICD-10-CM

## 2023-07-07 DIAGNOSIS — F341 Dysthymic disorder: Secondary | ICD-10-CM

## 2023-07-07 MED ORDER — BUPROPION HCL ER (XL) 450 MG PO TB24: 1.0000 | ORAL_TABLET | Freq: Every day | ORAL | 3 refills | Status: DC

## 2023-07-07 MED ORDER — GABAPENTIN 300 MG PO CAPS: 300.0000 mg | ORAL_CAPSULE | Freq: Every morning | ORAL | 3 refills | Status: DC

## 2023-07-07 MED ORDER — GABAPENTIN 100 MG PO CAPS: ORAL_CAPSULE | ORAL | Status: DC

## 2023-07-07 NOTE — Assessment & Plan Note (Signed)
Continues care from cardiology  Lipids controlled with crestor  No clinical changes or symptms

## 2023-07-07 NOTE — Assessment & Plan Note (Signed)
Colonoscopy 04/2022

## 2023-07-07 NOTE — Patient Instructions (Addendum)
Take 100 mg gabapentin in am  Take 300 mg at night     Add some strength training to your routine, this is important for bone and brain health and can reduce your risk of falls and help your body use insulin properly and regulate weight  Light weights, exercise bands , and internet videos are a good way to start  Yoga (chair or regular), machines , floor exercises or a gym with machines are also good options   Call the pharmacy about the wellbutrin xl (daw)- and if you find out it is not gettable then let me know so I can communicate with our pharmacist    I will print generic wellbutrin xl mg 450 mg just to see if you can find it

## 2023-07-07 NOTE — Assessment & Plan Note (Signed)
Reviewed health habits including diet and exercise and skin cancer prevention Reviewed appropriate screening tests for age  Also reviewed health mt list, fam hx and immunization status , as well as social and family history   See HPI Labs reviewed and ordered Flu vaccine today  Had shingrix series last year Mammogram utd 12/2022 Pap utd 03/2022 from gyn  Colonoscopy utd 04/2022  Discussed fall prevention, supplements and exercise for bone density   PHQ 5 - having trouble getting her antidepressant

## 2023-07-07 NOTE — Assessment & Plan Note (Signed)
Disc goals for lipids and reasons to control them Rev last labs with pt Rev low sat fat diet in detail   Conrolled  LDL 88  On crestor 5 mg daily

## 2023-07-07 NOTE — Assessment & Plan Note (Signed)
Under care of cardiology  Past surg from coarctation of aorta  Taking metoprolol xl 50 mg daily  No symptoms or clinical changes

## 2023-07-07 NOTE — Assessment & Plan Note (Signed)
Pt is stable as long as she takes wellbutrin xl 450 mg daily  In the past mood changes greatly when generic is changed so she gets it daw  This has been hard to get and pay for (on back order now she thinks)  Will also be changing ins soon  Using up a bene card   Wants to see if a generic forfivo 450 mg xl would be available (given printed prescription to take to several pharmacies to see if available0  If not will inquire with pharmacist re: how to do the other   Good self care Stress level stays high also

## 2023-07-07 NOTE — Assessment & Plan Note (Signed)
Pt suspects bursitis Offered ref to Dr Copland[facility] in future if she wants to be seen

## 2023-07-07 NOTE — Assessment & Plan Note (Signed)
Lab Results  Component Value Date   HGBA1C 5.6 07/02/2023   Fasting glucose 113 Eating more sweets lately  Will continue to monitor disc imp of low glycemic diet and wt loss to prevent DM2

## 2023-07-07 NOTE — Assessment & Plan Note (Signed)
Gabapentin is helping some 100 am and 200 pm  Would like to increase her pm dose Send in 300 mg for pm  Watch for sedation   Will update if not helpful

## 2023-09-29 ENCOUNTER — Other Ambulatory Visit: Payer: Self-pay | Admitting: *Deleted

## 2023-09-29 ENCOUNTER — Telehealth: Payer: Self-pay | Admitting: *Deleted

## 2023-09-29 MED ORDER — GABAPENTIN 300 MG PO CAPS: 300.0000 mg | ORAL_CAPSULE | Freq: Every morning | ORAL | 2 refills | Status: DC

## 2023-09-29 NOTE — Telephone Encounter (Signed)
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 She can't xfer the Rx due to the type of med it is

## 2023-09-29 NOTE — Telephone Encounter (Signed)
CRM # [id number] Owner: None Status: Resolved Open  Priority: Routine Created on: 09/29/2023 03:10 PM By: Saunders Revel D   Primary Information  Source  Elizabeth Hayden (Patient)   Subject  Elizabeth Hayden (Patient)   Topic  Clinical - Prescription Issue    Communication  Reason for CRM: Patient called to inform Medical Office that she has a different Insurance Administrator) and Monia Pouch will be requiring a Prior Authorization for buPROPion HCl ER, XL, 450 MG TB24. Patient also stated that she will need a new refill script as well for Gabapentin due to new insurance as also.

## 2023-09-29 NOTE — Telephone Encounter (Signed)
Will route to Prior Auth team to start on PA. Will make a refill request for refill of med

## 2023-10-02 ENCOUNTER — Other Ambulatory Visit: Payer: Self-pay | Admitting: Family Medicine

## 2023-10-04 ENCOUNTER — Telehealth: Payer: Self-pay

## 2023-10-04 ENCOUNTER — Other Ambulatory Visit (HOSPITAL_COMMUNITY): Payer: Self-pay

## 2023-10-04 NOTE — Telephone Encounter (Signed)
PA request has been Submitted. New Encounter created for follow up. For additional info see Pharmacy Prior Auth telephone encounter from 10/04/23.

## 2023-10-04 NOTE — Telephone Encounter (Signed)
Pharmacy Patient Advocate Encounter   Received notification from Pt Calls Messages that prior authorization for buPROPion HCl ER (XL) 450MG  er tablets  is required/requested.   Insurance verification completed.   The patient is insured through [facility] .   Per test claim: PA required; PA submitted to above mentioned insurance via CoverMyMeds Key/confirmation #/EOC [id number] Status is pending

## 2023-10-05 ENCOUNTER — Other Ambulatory Visit (HOSPITAL_COMMUNITY): Payer: Self-pay

## 2023-10-05 NOTE — Telephone Encounter (Signed)
Pharmacy Patient Advocate Encounter  Received notification from [facility] that Prior Authorization for buPROPion HCl ER (XL) 450MG  er tablets has been APPROVED from 10/04/2023 to 10/03/2024. Ran test claim, Copay is $100.00. This test claim was processed through [facility]- copay amounts may vary at other pharmacies due to pharmacy/plan contracts, or as the patient moves through the different stages of their insurance plan.   PA #/Case ID/Reference #:  [id number]

## 2023-10-21 ENCOUNTER — Encounter: Payer: Self-pay | Admitting: Physician Assistant

## 2023-10-21 ENCOUNTER — Ambulatory Visit: Payer: Self-pay | Admitting: Physician Assistant

## 2023-10-21 DIAGNOSIS — M7551 Bursitis of right shoulder: Secondary | ICD-10-CM

## 2023-10-21 MED ORDER — METHYLPREDNISOLONE 4 MG PO TBPK: ORAL_TABLET | ORAL | 0 refills | Status: DC

## 2023-10-21 MED ORDER — DICLOFENAC SODIUM 1 % EX GEL: 2.0000 g | Freq: Four times a day (QID) | CUTANEOUS | 0 refills | Status: AC

## 2023-10-21 NOTE — Progress Notes (Signed)
   Subjective: Right shoulder pain    Patient ID: Elizabeth Hayden, female    DOB: 1971-03-17, 53 y.o.   MRN: [id number]  HPI Patient complaining of 2 days of right superior anterior shoulder pain secondary to exercising.  Patient states pain increased abduction and overhead reaching.  Denies loss of sensation.  Rates pain as a 4/10.  Patient is a history of left shoulder bursitis that was treated in the past with steroid injections.   Review of Systems Allergic rhinitis, aortic aneurysm, and hyperlipidemia.  Cardiomyopathy    Objective:   Physical Exam  Patient is right-hand dominant.  No obvious deformity.  No ecchymosis, edema, erythema.  Patient has full and equal range of motion.  Mild guarding at the [facility] joint.      Assessment & Plan: Bursitis  Patient given prescription Medrol  Dosepak and Voltaren  gel.  Advised to follow-up in 5 days if no improvement or worsening complaint.  Decreased exercise involving the right upper extremity for 1 week.  Will consider imaging and a consult to orthopedics.

## 2023-10-21 NOTE — Progress Notes (Signed)
 Shoulder pain off & on for a while, but last night when she was exercing she started experiencing intermittent sharp pain that lingers for a little while & then goes away.  Took some tylenol  Can't take NSAIDs because she's on Metoprolol  Hx of bursitis in left shoulder & has rec'd sterroid injection in that shoulder with relief.

## 2023-10-25 DIAGNOSIS — H6123 Impacted cerumen, bilateral: Secondary | ICD-10-CM | POA: Diagnosis not present

## 2023-10-25 DIAGNOSIS — H902 Conductive hearing loss, unspecified: Secondary | ICD-10-CM | POA: Diagnosis not present

## 2023-10-28 ENCOUNTER — Encounter: Payer: Self-pay | Admitting: Physician Assistant

## 2023-10-28 ENCOUNTER — Ambulatory Visit: Payer: Self-pay | Admitting: Physician Assistant

## 2023-10-28 DIAGNOSIS — G8929 Other chronic pain: Secondary | ICD-10-CM

## 2023-10-28 NOTE — Progress Notes (Signed)
   Subjective: Right shoulder pain    Patient ID: Elizabeth Hayden, female    DOB: 12-Mar-1971, 53 y.o.   MRN: [id number]  HPI Patient is follow-up for right shoulder pain which increased with forward extension.  Stated no relief with muscle relaxants and anti-inflammatory medications.  Patient states she is limiting by pain reason for the touch screen in her vehicle.  Denies loss of sensation.   Review of Systems Allergic rhinitis and cardiomyopathy    Objective:   Physical Exam Vital signs not taken. No obvious ecchymosis, edema, erythema, or obvious deformity.  Moderate guarding with palpation of the GH joint.      Assessment & Plan: Right upper shoulder pain  Patient will be sent to [facility] for imaging.

## 2023-10-29 ENCOUNTER — Other Ambulatory Visit: Payer: Self-pay | Admitting: Family Medicine

## 2023-10-29 DIAGNOSIS — M7541 Impingement syndrome of right shoulder: Secondary | ICD-10-CM | POA: Diagnosis not present

## 2023-10-29 MED ORDER — BUPROPION HCL ER (XL) 450 MG PO TB24: 1.0000 | ORAL_TABLET | Freq: Every day | ORAL | 1 refills | Status: DC

## 2023-10-29 MED ORDER — BUPROPION HCL ER (XL) 450 MG PO TB24: 1.0000 | ORAL_TABLET | Freq: Every day | ORAL | 3 refills | Status: DC

## 2023-10-29 NOTE — Addendum Note (Signed)
Addended by: Wendie Simmer B on: 10/29/2023 04:08 PM   Modules accepted: Orders

## 2023-10-29 NOTE — Telephone Encounter (Signed)
Copied from CRM [id number]. Topic: Clinical - Medication Refill >> Oct 29, 2023  3:46 PM Irine Seal wrote: Most Recent Primary Care Visit:  Provider: Roxy Manns A  Department: [facility]  Visit Type: PHYSICAL  Date: 07/07/2023  Medication:  buPROPion HCl ER, XL, 450 MG TB24 [[id number]]     Has the patient contacted their pharmacy? Yes (Agent: If no, request that the patient contact the pharmacy for the refill. If patient does not wish to contact the pharmacy document the reason why and proceed with request.) (Agent: If yes, when and what did the pharmacy advise?) insurance changed needing to change pharmacy   Is this the correct pharmacy for this prescription?  If no, delete pharmacy and type the correct one.  This is the patient's preferred pharmacy:    [facility] [place], [place] - [address] [facility] AT Portal to [facility] [address] [place] [address] Phone: [phone] Fax: [phone]    Has the prescription been filled recently? Yes  Is the patient out of the medication? Yes  Has the patient been seen for an appointment in the last year OR does the patient have an upcoming appointment? Yes  Can we respond through MyChart? Yes  Agent: Please be advised that Rx refills may take up to 3 business days. We ask that you follow-up with your pharmacy.

## 2023-12-24 ENCOUNTER — Other Ambulatory Visit: Payer: Self-pay | Admitting: Obstetrics and Gynecology

## 2023-12-24 DIAGNOSIS — Z0000 Encounter for general adult medical examination without abnormal findings: Secondary | ICD-10-CM

## 2024-01-03 ENCOUNTER — Ambulatory Visit
Admission: RE | Admit: 2024-01-03 | Discharge: 2024-01-03 | Disposition: A | Discharge status: Home / Self Care | Source: Ambulatory Visit | Attending: Obstetrics and Gynecology | Admitting: Obstetrics and Gynecology

## 2024-01-03 DIAGNOSIS — Z0000 Encounter for general adult medical examination without abnormal findings: Secondary | ICD-10-CM

## 2024-01-03 DIAGNOSIS — Z1231 Encounter for screening mammogram for malignant neoplasm of breast: Secondary | ICD-10-CM | POA: Diagnosis not present

## 2024-01-05 ENCOUNTER — Encounter: Payer: Self-pay | Admitting: Family Medicine

## 2024-01-14 NOTE — Progress Notes (Signed)
 Cardiology Office Note   Date:  01/21/2024   ID:  Elizabeth Hayden, DOB Jun 23, 1971, MRN [id number]  PCP:  Judy Pimple, MD  Cardiologist: Dr. Eden Emms, MD   History of Present Illness:  53 y.o. f/u AVR for bicuspid AV and aortic root aneurysm   09/03/14  AVR with [facility]  Cath with no CAD prior to surgery    3.   Valve-sparing aortic root replacement with reimplantation of the native aortic valve and left and right coronary arteries ( 28 mm Gelweave valsalva graft). 4.   Replacement of ascending aortic aneurysm with a 28 mm Hemashield graft using deep hypothermic circulatory arrest.  Echo 09/20/18 with no significant AR/gradients normal EF and root Echo 11/21/21 EF normal no gradients/AR no coarctation gradients   Son Molli Hazard has Down's Syndrome  Daughter age 83 works in Proofreader in [place]   Still working at [employer] in good shape   LDL has been as high as 171 prior to Rx Started on Crestor 5 mg 01/28/22  LDL improved to 72   [name] died of massive heart attack in February and did not have a will     Past Medical History:  Diagnosis Date   Aortic root dilation (HCC)    Colon polyp 2020   Congenital heart disease    bicuspid aortic valve with coarctation repair 04/1974   Depression    Edema    Fibrocystic breast    GERD (gastroesophageal reflux disease)    takes Zantac daily as needed   HPV (human papilloma virus) infection    Seizures (HCC)    as a baby and only 1 time d/t high fever   Shortness of breath dyspnea    with exertion   Sinus infection    Nov 16 and treated with antibiotic   TMJ (dislocation of temporomandibular joint)    Wears a night gear    UTI (lower urinary tract infection)    hx of    Yeast infection    08/17/14    Past Surgical History:  Procedure Laterality Date   ASCENDING AORTIC ROOT REPLACEMENT N/A 09/03/2014   Procedure: ASCENDING AORTIC ROOT REPLACEMENT & VALVE SPARING ROOT;  Surgeon: Alleen Borne, MD;   Location: [facility] OR;  Service: Open Heart Surgery;  Laterality: N/A;  CIRC ARREST   COARCTATION OF AORTA EXCISION  1975   COLONOSCOPY  08/2019   [facility] in [place], repeat after 5 yrs   COLPOSCOPY  04/26/2015   CRYOTHERAPY  2009   EYE SURGERY     EYE SURGERY Bilateral 1985   INTRAOPERATIVE TRANSESOPHAGEAL ECHOCARDIOGRAM N/A 09/03/2014   Procedure: INTRAOPERATIVE TRANSESOPHAGEAL ECHOCARDIOGRAM;  Surgeon: Alleen Borne, MD;  Location: [facility] OR;  Service: Open Heart Surgery;  Laterality: N/A;   LEFT HEART CATHETERIZATION WITH CORONARY ANGIOGRAM N/A 08/06/2014   Procedure: LEFT HEART CATHETERIZATION WITH CORONARY ANGIOGRAM;  Surgeon: Wendall Stade, MD;  Location: [facility] CATH LAB;  Service: Cardiovascular;  Laterality: N/A;   TEE WITHOUT CARDIOVERSION N/A 08/06/2014   Procedure: TRANSESOPHAGEAL ECHOCARDIOGRAM (TEE);  Surgeon: Wendall Stade, MD;  Location: [facility] ENDOSCOPY;  Service: Cardiovascular;  Laterality: N/A;     Current Outpatient Medications  Medication Sig Dispense Refill   Biotin w/ Vitamins C & E (HAIR SKIN & NAILS GUMMIES PO) Take 1 tablet by mouth daily.     buPROPion HCl ER, XL, 450 MG TB24 Take 1 tablet (450 mg total) by mouth daily. 90 tablet 1  CALCIUM-VITAMIN D PO Take 2 tablets by mouth daily.     diclofenac Sodium (VOLTAREN ARTHRITIS PAIN) 1 % GEL Apply 2 g topically 4 (four) times daily. 50 g 0   gabapentin (NEURONTIN) 100 MG capsule Take 1 pill by mouth in am and 2 pills at bed time (Patient taking differently: in the morning. Take 1 pill by mouth in am and 2 pills)     gabapentin (NEURONTIN) 300 MG capsule Take 1 capsule (300 mg total) by mouth in the morning. (Patient taking differently: Take 300 mg by mouth at bedtime.) 90 capsule 2   metoprolol succinate (TOPROL-XL) 50 MG 24 hr tablet TAKE 1 TABLET BY MOUTH DAILY  WITH OR IMMEDIATELY FOLLOWING A  MEAL 90 tablet 3   rosuvastatin (CRESTOR) 5 MG tablet TAKE 1 TABLET BY MOUTH DAILY 90 tablet 3   methylPREDNISolone (MEDROL DOSEPAK) 4 MG  TBPK tablet Take Tapered dose as directed (Patient not taking: Reported on 01/21/2024) 21 tablet 0   No current facility-administered medications for this visit.    Allergies:   Ace inhibitors and Altace [ramipril]    Social History:  The patient  reports that she has never smoked. She has never used smokeless tobacco. She reports current alcohol use. She reports that she does not use drugs.   Family History:  The patient'sfamily history includes Breast cancer in her maternal grandmother; Cancer in her father; Celiac disease in her son; Diabetes in her father; Down syndrome in her son; Hypertension in her father; Leukemia in her maternal grandmother; Ovarian cancer in her paternal grandmother.    ROS:  Please see the history of present illness. Otherwise, review of systems are positive for none.   All other systems are reviewed and negative.    PHYSICAL EXAM: VS:  BP 102/72   Pulse 68   Ht 5\' 8"  (1.727 m)   Wt 155 lb (70.3 kg)   LMP 03/05/2022 (Exact Date)   SpO2 98%   BMI 23.57 kg/m  , BMI Body mass index is 23.57 kg/m.   Affect appropriate Healthy:  appears stated age HEENT: normal Neck supple with no adenopathy JVP normal no bruits no thyromegaly Lungs clear with no wheezing and good diaphragmatic motion Heart:  S1/S2 SEM no AR  murmur, no rub, gallop or click PMI normal post sternotomy  Abdomen: benighn, BS positve, no tenderness, no AAA no bruit.  No HSM or HJR Distal pulses intact with no bruits No edema Neuro non-focal Skin warm and dry No muscular weakness     EKG:  SR rate 70 LAE LAD 08/11/19 09/17/20 SR rate 58 nonspecific ST changes   Recent Labs: 07/02/2023: ALT 20; BUN 18; Creatinine, Ser 0.96; Hemoglobin 13.4; Platelets 256; Potassium 4.2; Sodium 139; TSH 2.260    Lipid Panel    Component Value Date/Time   CHOL 171 07/02/2023 0936   TRIG 77 07/02/2023 0936   HDL 69 07/02/2023 0936   CHOLHDL 2.5 07/02/2023 0936   CHOLHDL 3.4 CALC 11/13/2008 1030    VLDL 9 11/13/2008 1030   LDLCALC 88 07/02/2023 0936      Wt Readings from Last 3 Encounters:  01/21/24 155 lb (70.3 kg)  07/07/23 148 lb 6 oz (67.3 kg)  05/07/23 146 lb (66.2 kg)    Other studies Reviewed: Additional studies/ records that were reviewed today include:   Echocardiogram 11/21/21   IMPRESSIONS     1. Left ventricular ejection fraction, by estimation, is 60 to 65%. The  left ventricle has normal  function. The left ventricle has no regional  wall motion abnormalities. There is mild left ventricular hypertrophy.  Left ventricular diastolic parameters  were normal. The average left ventricular global longitudinal strain is  -20.6 %.   2. Right ventricular systolic function is normal. The right ventricular  size is normal. There is normal pulmonary artery systolic pressure. The  estimated right ventricular systolic pressure is 21.7 mmHg.   3. The mitral valve is normal in structure. Trivial mitral valve  regurgitation.   4. The aortic valve was not well visualized. Aortic valve regurgitation  is not visualized. No aortic stenosis is present.   5. S/p valve sparing aortic root replacement   6. The inferior vena cava is normal in size with greater than 50%  respiratory variability, suggesting right atrial pressure of 3 mmHg.    ASSESSMENT AND PLAN:  1.  Aortic aneurysm: -Post root replacement 08/2014 per Dr. Lavinia Sharps -No symptoms -Continue beta-blocker - SBE prophylaxis   2.  Bicuspid AV: -post valve sparing AVR with root replacement  08/2014  -  TTE 11/21/21 with no AR/AS and no high velocities suprasternal notch post coarctation repair   3.  Chronic edema: -Consider as needed diuretic  4. HLDL  Improved on crestor update labs   Current medicines are reviewed at length with the patient today.  The patient does not have concerns regarding medicines.  Labs/ tests ordered today include: None  Orders Placed This Encounter  Procedures   EKG 12-Lead     Disposition:   FU  In a year    Signed, Charlton Haws, MD  01/21/2024 8:24 AM    [facility] [address] [place], [place], [place]  [address] Phone: [phone]; Fax: [phone]

## 2024-01-21 ENCOUNTER — Encounter: Payer: Self-pay | Admitting: Cardiovascular Disease

## 2024-01-21 ENCOUNTER — Ambulatory Visit: Payer: Self-pay | Attending: Cardiology | Admitting: Cardiovascular Disease

## 2024-01-21 VITALS — BP 102/72 | HR 68 | Ht 68.0 in | Wt 155.0 lb

## 2024-01-21 DIAGNOSIS — Q231 Congenital insufficiency of aortic valve: Secondary | ICD-10-CM

## 2024-01-21 DIAGNOSIS — I359 Nonrheumatic aortic valve disorder, unspecified: Secondary | ICD-10-CM | POA: Diagnosis not present

## 2024-01-21 NOTE — Patient Instructions (Signed)
 Medication Instructions:  Your physician recommends that you continue on your current medications as directed. Please refer to the Current Medication list given to you today.  *If you need a refill on your cardiac medications before your next appointment, please call your pharmacy*  Lab Work: If you have labs (blood work) drawn today and your tests are completely normal, you will receive your results only by: MyChart Message (if you have MyChart) OR A paper copy in the mail If you have any lab test that is abnormal or we need to change your treatment, we will call you to review the results.  Testing/Procedures: None ordered today.  Follow-Up: At [facility], you and your health needs are our priority.  As part of our continuing mission to provide you with exceptional heart care, our providers are all part of one team.  This team includes your primary Cardiologist (physician) and Advanced Practice Providers or APPs (Physician Assistants and Nurse Practitioners) who all work together to provide you with the care you need, when you need it.  Your next appointment:   1 year(s)  Provider:   Charlton Haws, MD     We recommend signing up for the patient portal called "MyChart".  Sign up information is provided on this After Visit Summary.  MyChart is used to connect with patients for Virtual Visits (Telemedicine).  Patients are able to view lab/test results, encounter notes, upcoming appointments, etc.  Non-urgent messages can be sent to your provider as well.   To learn more about what you can do with MyChart, go to [email or web address].   Other Instructions       1st Floor: - Lobby - Registration  - Pharmacy  - Lab - Cafe  2nd Floor: - PV Lab - Diagnostic Testing (echo, CT, nuclear med)  3rd Floor: - Vacant  4th Floor: - TCTS (cardiothoracic surgery) - AFib Clinic - Structural Heart Clinic - Vascular Surgery  - Vascular Ultrasound  5th Floor: - HeartCare  Cardiology (general and EP) - Clinical Pharmacy for coumadin, hypertension, lipid, weight-loss medications, and med management appointments    Valet parking services will be available as well.

## 2024-03-15 ENCOUNTER — Telehealth: Payer: Self-pay | Admitting: Cardiovascular Disease

## 2024-03-15 ENCOUNTER — Other Ambulatory Visit (HOSPITAL_COMMUNITY): Payer: Self-pay

## 2024-03-15 MED ORDER — METOPROLOL SUCCINATE ER 50 MG PO TB24
50.0000 mg | ORAL_TABLET | Freq: Every day | ORAL | 3 refills | Status: DC
  Filled 2024-03-15: qty 30, 30d supply, fill #0

## 2024-03-15 NOTE — Telephone Encounter (Signed)
*  STAT* If patient is at the pharmacy, call can be transferred to refill team.   1. Which medications need to be refilled? (please list name of each medication and dose if known)   rosuvastatin  (CRESTOR ) 5 MG tablet  metoprolol  succinate (TOPROL -XL) 50 MG 24 hr tablet   2. Would you like to learn more about the convenience, safety, & potential cost savings by using the [facility]?   3. Are you open to using the [facility] (Type [facility]. ).  4. Which pharmacy/location (including street and city if local pharmacy) is medication to be sent to?  [facility] - [place], [place] - [address] [facility] AT Portal to Reg[facility] Sites   5. Do they need a 30 day or 90 day supply?   90 day   Patient stated she still has some medication but has now changed insurance.

## 2024-03-23 ENCOUNTER — Other Ambulatory Visit (HOSPITAL_COMMUNITY): Payer: Self-pay

## 2024-03-23 MED ORDER — METOPROLOL SUCCINATE ER 50 MG PO TB24: 50.0000 mg | ORAL_TABLET | Freq: Every day | ORAL | 3 refills | Status: AC

## 2024-03-23 MED ORDER — ROSUVASTATIN CALCIUM 5 MG PO TABS
5.0000 mg | ORAL_TABLET | Freq: Every day | ORAL | 3 refills | Status: AC
Start: 2024-03-23 — End: ?

## 2024-03-23 NOTE — Telephone Encounter (Signed)
 Patient called stating her pharmacy ([facility]) has not received her refill request and wants refill prescriptions for rosuvastatin  (CRESTOR ) 5 MG tablet  metoprolol  succinate (TOPROL -XL) 50 MG 24 hr tablet resent.  Patient wants call to confirm refills have been sent.  Patient noted she only has a week left of this medication.

## 2024-03-23 NOTE — Addendum Note (Signed)
 Addended by: Debrah Fan, Carlyann Placide L on: 03/23/2024 09:06 AM   Modules accepted: Orders

## 2024-03-23 NOTE — Telephone Encounter (Signed)
 RX's sent in to [facility]. Pt called and she verbalized understanding of this.

## 2024-04-27 DIAGNOSIS — H6123 Impacted cerumen, bilateral: Secondary | ICD-10-CM | POA: Diagnosis not present

## 2024-04-27 DIAGNOSIS — H902 Conductive hearing loss, unspecified: Secondary | ICD-10-CM | POA: Diagnosis not present

## 2024-05-10 ENCOUNTER — Other Ambulatory Visit: Payer: Self-pay | Admitting: Family Medicine

## 2024-05-29 ENCOUNTER — Other Ambulatory Visit: Payer: Self-pay | Admitting: Medical Genetics

## 2024-05-31 ENCOUNTER — Ambulatory Visit (INDEPENDENT_AMBULATORY_CARE_PROVIDER_SITE_OTHER): Admitting: Podiatry

## 2024-05-31 ENCOUNTER — Ambulatory Visit (INDEPENDENT_AMBULATORY_CARE_PROVIDER_SITE_OTHER)

## 2024-05-31 VITALS — Ht 68.0 in | Wt 155.0 lb

## 2024-05-31 DIAGNOSIS — M7742 Metatarsalgia, left foot: Secondary | ICD-10-CM

## 2024-05-31 DIAGNOSIS — M62461 Contracture of muscle, right lower leg: Secondary | ICD-10-CM | POA: Diagnosis not present

## 2024-05-31 DIAGNOSIS — Q6672 Congenital pes cavus, left foot: Secondary | ICD-10-CM | POA: Diagnosis not present

## 2024-05-31 DIAGNOSIS — M62462 Contracture of muscle, left lower leg: Secondary | ICD-10-CM | POA: Diagnosis not present

## 2024-05-31 DIAGNOSIS — Q6671 Congenital pes cavus, right foot: Secondary | ICD-10-CM | POA: Diagnosis not present

## 2024-05-31 DIAGNOSIS — M7751 Other enthesopathy of right foot: Secondary | ICD-10-CM | POA: Diagnosis not present

## 2024-05-31 DIAGNOSIS — M7752 Other enthesopathy of left foot: Secondary | ICD-10-CM

## 2024-05-31 DIAGNOSIS — M7741 Metatarsalgia, right foot: Secondary | ICD-10-CM | POA: Diagnosis not present

## 2024-05-31 NOTE — Progress Notes (Signed)
 Subjective:  Patient ID: Elizabeth Hayden, female    DOB: Feb 20, 1971,  MRN: [id number]  Chief Complaint  Patient presents with   Foot Pain    Rm 2 Patient is here for bilateral foot pain. Patient states pain in top of right foot began three weeks when flexing foot forwards  it feels like the skin is gong to tear. Patient has bilateral pain in the ball of the feet (was seen in 2018) and received a recommendation to purchase new shoes.     Discussed the use of AI scribe software for clinical note transcription with the patient, who gave verbal consent to proceed.  History of Present Illness Elizabeth Hayden is a 53 year old female who presents with foot pain and discomfort during physical activity.  She experiences pain in her right foot, particularly when stretching or pointing her foot during gym activities. The sensation is described as feeling like the skin is going to rip. This pain is localized to the right foot and occurs only during stretching, not while walking.  She also experiences pain in the balls of both feet after prolonged periods of walking or standing, describing it as a sensation of the bones rubbing together. The pain builds up over time rather than occurring immediately upon walking. Additionally, there is numbness in the right foot, particularly in the area where the pain is most pronounced.  Her big toe on the right foot tends to hurt occasionally, although she is unsure if this is related to aging. She can bend her toes without issue when not wearing shoes, suggesting that footwear may be contributing to her symptoms.  She reports difficulty finding properly fitting shoes due to her foot shape, including thin heels. She currently uses non-custom orthotics in her gym sneakers, which provide some relief, but she has not tried custom orthotics.  No shooting pain or numbness in the toes when the pain occurs.      Objective:    Physical Exam VASCULAR: DP and PT pulse  palpable. Foot is warm and well-perfused. Capillary fill time is brisk. DERMATOLOGIC: Normal skin turgor, texture, and temperature. No open lesions, rashes, or ulcerations. NEUROLOGIC: Normal sensation to light touch and pressure. No paresthesias. ORTHOPEDIC: Bilateral Pes cavus foot type with diffuse tenderness on the ball of the foot and slight tenderness in the third interspace. No neuritic symptoms, palpable neuroma, Mulder's click, or Sullivan sign. Gastrocnemius equinus.     No images are attached to the encounter.    Results RADIOLOGY Bilateral Foot radiograph: Pes cavus deformity without major arthritic changes (05/31/2024)   Assessment:   1. Pes cavus of both feet   2. Gastrocnemius equinus of left lower extremity   3. Gastrocnemius equinus of right lower extremity   4. Metatarsalgia of both feet      Plan:  Patient was evaluated and treated and all questions answered.  Assessment and Plan Assessment & Plan Metatarsalgia Chronic pain in the balls of both feet, exacerbated by prolonged walking or standing, due to high arches (pes cavus) increasing pressure on the metatarsals. Radiographs confirm pes cavus deformity without major arthritic changes. The condition is not severe enough to warrant invasive interventions at this time. - Refer to orthotist for custom orthotics with metatarsal pad to redistribute pressure and alleviate symptoms - Advise on shoe lacing technique to reduce pressure on the top of the foot - Recommend calf stretching exercises twice daily to alleviate tightness in the Achilles tendon  Pes Cavus High arches leading  to increased pressure on the metatarsals and contributing to metatarsalgia. The condition complicates shoe fitting, requiring attention to shoe width and toe box size. - Recommend trying Altra brand shoes for a better fit due to her wide toe box and narrow heel  Gastrocnemius Equinus Tightness in the gastrocnemius muscle contributing to  increased pressure on the forefoot and metatarsalgia. - Recommend calf stretching exercises twice daily to improve flexibility and reduce pressure on the forefoot  Neuroma Possible small neuroma in the forefoot contributing to pain. No neuritic symptoms or palpable neuroma were present during the examination. The condition is not severe enough to require steroid injection at this time. - Monitor symptoms and consider steroid injection if pain worsens or if a palpable neuroma develops  Bursitis in shoulders Bursitis in both shoulders, mentioned in the context of discussing pain management.      Return if symptoms worsen or fail to improve.

## 2024-05-31 NOTE — Patient Instructions (Signed)
  VISIT SUMMARY: Today, we discussed the pain and discomfort you are experiencing in your feet, particularly during physical activity. We reviewed your symptoms, including pain in the balls of your feet, numbness, and occasional pain in your big toe. We also discussed your difficulty in finding properly fitting shoes and your current use of non-custom orthotics.  YOUR PLAN: -METATARSALGIA: Metatarsalgia is pain in the balls of your feet, often due to high arches putting extra pressure on the metatarsals. We confirmed this with radiographs. You will be referred to an orthotist for custom orthotics with a metatarsal pad to help redistribute pressure and alleviate your symptoms. Additionally, we discussed a shoe lacing technique to reduce pressure on the top of your foot and recommended calf stretching exercises twice daily to relieve tightness in your Achilles tendon.  -PES CAVUS: Pes Cavus means having high arches, which can increase pressure on the metatarsals and make it difficult to find properly fitting shoes. We recommend trying Altra brand shoes, which may provide a better fit due to their wide toe box and narrow heel.  -GASTROCNEMIUS EQUINUS: Gastrocnemius Equinus is tightness in the calf muscle, which can increase pressure on the forefoot and contribute to metatarsalgia. We recommend doing calf stretching exercises twice daily to improve flexibility and reduce pressure on your forefoot.  -NEUROMA: A neuroma is a thickening of nerve tissue in the forefoot that can cause pain. While no neuroma was found during your examination, we will monitor your symptoms and consider a steroid injection if the pain worsens or if a neuroma develops.  -BURSITIS IN SHOULDERS: Bursitis is inflammation of the fluid-filled sacs (bursae) that cushion your joints. This was mentioned in the context of discussing your overall pain management.  INSTRUCTIONS: Please follow up with the orthotist for custom orthotics.  Continue with the recommended calf stretching exercises twice daily. Monitor your symptoms, especially in the forefoot, and report any worsening pain or development of a palpable neuroma. Consider trying Altra brand shoes for a better fit. If your shoulder pain from bursitis persists, please let us  know.                      Contains text generated by Abridge.                                 Contains text generated by Abridge.

## 2024-06-01 ENCOUNTER — Other Ambulatory Visit
Admission: RE | Admit: 2024-06-01 | Discharge: 2024-06-01 | Disposition: A | Discharge status: Home / Self Care | Payer: Self-pay | Source: Ambulatory Visit | Attending: Medical Genetics | Admitting: Medical Genetics

## 2024-06-07 ENCOUNTER — Ambulatory Visit: Payer: Self-pay | Admitting: Physician Assistant

## 2024-06-07 ENCOUNTER — Encounter: Payer: Self-pay | Admitting: Physician Assistant

## 2024-06-07 VITALS — BP 139/81 | HR 64 | Temp 98.9°F | Resp 16

## 2024-06-07 DIAGNOSIS — M7652 Patellar tendinitis, left knee: Secondary | ICD-10-CM

## 2024-06-07 NOTE — Progress Notes (Signed)
 Reports concern of left medial side discomfort since last evening and reports no pain at rest, but pain with movement between 6-7 and notes pain increased when walking down steps and also it woke her up last night.  She denies injury and used a knee soft support that had been effective.  Skin intact and no evidence of swelling.  Reports no treatment to this area before, but has had [facility] to evaluate R shoulder.  Was seen at [facility] for her feet and being measured for orthotics.  Wishes for her comfort to speak to provider.

## 2024-06-07 NOTE — Progress Notes (Signed)
   Subjective: Left knee pain    Patient ID: Elizabeth Hayden, female    DOB: 1971/05/19, 53 y.o.   MRN: [id number]  HPI Patient complaining of left medial knee pain since last evening.  Patient states she noticed increased pain and a popping sensation while walking downstairs yesterday.  Patient also stated knee pain woke her up from sleeping last night.  No other provocative incident for complaint.  Patient relates history of increased physical training consisting of Zuma and weight training.  Denies loss sensation or loss of function.   Review of Systems Aortic aneurysm, cardiomyopathy, allergic rhinitis, and hyperlipidemia.    Objective:   Physical Exam BP 139/81  Pulse Rate 64  Temp 98.9 F (37.2 C)  Resp 16  SpO2 98 %  No obvious deformity to the left knee.  No ecchymosis, edema, or erythema.  Mild crepitus of palpation.  Full and equal range of motion.  Strength against resistance 5/5.  Neurovascular intact.       Assessment & Plan: Tendinitis versus early arthritis left knee  Advise elastic knee support for physical activities.  Advised Voltaren  gel application 4 times a day.  Follow-up in 2 weeks if no improvement or worsening complaint.

## 2024-06-11 LAB — GENECONNECT MOLECULAR SCREEN: Genetic Analysis Overall Interpretation: NEGATIVE

## 2024-06-12 ENCOUNTER — Other Ambulatory Visit: Payer: Self-pay | Admitting: Family Medicine

## 2024-06-13 ENCOUNTER — Encounter: Payer: Self-pay | Admitting: Family Medicine

## 2024-06-13 MED ORDER — GABAPENTIN 100 MG PO CAPS: 100.0000 mg | ORAL_CAPSULE | Freq: Every morning | ORAL | 0 refills | Status: DC

## 2024-06-13 NOTE — Telephone Encounter (Signed)
 See pt's mychart message regarding med   CPE scheduled 07/07/24

## 2024-06-14 NOTE — Telephone Encounter (Signed)
 Last filled on 09/29/23 #90 caps/ 2 refill

## 2024-06-21 DIAGNOSIS — M2392 Unspecified internal derangement of left knee: Secondary | ICD-10-CM | POA: Diagnosis not present

## 2024-06-21 DIAGNOSIS — S8992XA Unspecified injury of left lower leg, initial encounter: Secondary | ICD-10-CM | POA: Diagnosis not present

## 2024-06-21 DIAGNOSIS — M25462 Effusion, left knee: Secondary | ICD-10-CM | POA: Diagnosis not present

## 2024-06-21 DIAGNOSIS — M25562 Pain in left knee: Secondary | ICD-10-CM | POA: Diagnosis not present

## 2024-06-22 ENCOUNTER — Other Ambulatory Visit: Payer: Self-pay | Admitting: Sports Medicine

## 2024-06-22 DIAGNOSIS — M25462 Effusion, left knee: Secondary | ICD-10-CM

## 2024-06-22 DIAGNOSIS — S8992XA Unspecified injury of left lower leg, initial encounter: Secondary | ICD-10-CM

## 2024-06-22 DIAGNOSIS — M2392 Unspecified internal derangement of left knee: Secondary | ICD-10-CM

## 2024-06-28 ENCOUNTER — Ambulatory Visit
Admission: RE | Admit: 2024-06-28 | Discharge: 2024-06-28 | Disposition: A | Discharge status: Home / Self Care | Source: Ambulatory Visit | Attending: Sports Medicine | Admitting: Sports Medicine

## 2024-06-28 DIAGNOSIS — M25462 Effusion, left knee: Secondary | ICD-10-CM | POA: Diagnosis not present

## 2024-06-28 DIAGNOSIS — M2392 Unspecified internal derangement of left knee: Secondary | ICD-10-CM | POA: Insufficient documentation

## 2024-06-28 DIAGNOSIS — S8992XA Unspecified injury of left lower leg, initial encounter: Secondary | ICD-10-CM | POA: Diagnosis not present

## 2024-06-28 DIAGNOSIS — M25562 Pain in left knee: Secondary | ICD-10-CM | POA: Diagnosis not present

## 2024-06-28 DIAGNOSIS — M94262 Chondromalacia, left knee: Secondary | ICD-10-CM | POA: Diagnosis not present

## 2024-06-30 ENCOUNTER — Ambulatory Visit (INDEPENDENT_AMBULATORY_CARE_PROVIDER_SITE_OTHER)

## 2024-06-30 DIAGNOSIS — Q6672 Congenital pes cavus, left foot: Secondary | ICD-10-CM

## 2024-06-30 DIAGNOSIS — M62461 Contracture of muscle, right lower leg: Secondary | ICD-10-CM | POA: Diagnosis not present

## 2024-06-30 DIAGNOSIS — Q6671 Congenital pes cavus, right foot: Secondary | ICD-10-CM | POA: Diagnosis not present

## 2024-06-30 DIAGNOSIS — M7742 Metatarsalgia, left foot: Secondary | ICD-10-CM | POA: Diagnosis not present

## 2024-06-30 DIAGNOSIS — M7741 Metatarsalgia, right foot: Secondary | ICD-10-CM

## 2024-06-30 DIAGNOSIS — M62462 Contracture of muscle, left lower leg: Secondary | ICD-10-CM

## 2024-07-03 NOTE — Progress Notes (Signed)
 Orthotics   Patient was present and evaluated for Custom molded foot orthotics. Patient will benefit from CFO's to provide total contact to BIL MLA's helping to balance and distribute body weight more evenly across BIL feet helping to reduce plantar pressure and pain. Orthotic will also encourage FF / RF alignment  Patient was scanned today and will return for fitting upon receipt  Lolita Schultze Cped,

## 2024-07-04 DIAGNOSIS — M25462 Effusion, left knee: Secondary | ICD-10-CM | POA: Diagnosis not present

## 2024-07-04 DIAGNOSIS — M25562 Pain in left knee: Secondary | ICD-10-CM | POA: Diagnosis not present

## 2024-07-04 DIAGNOSIS — M1712 Unilateral primary osteoarthritis, left knee: Secondary | ICD-10-CM | POA: Diagnosis not present

## 2024-07-04 DIAGNOSIS — S8992XD Unspecified injury of left lower leg, subsequent encounter: Secondary | ICD-10-CM | POA: Diagnosis not present

## 2024-07-07 ENCOUNTER — Encounter: Payer: Self-pay | Admitting: Family Medicine

## 2024-07-07 ENCOUNTER — Ambulatory Visit: Admitting: Family Medicine

## 2024-07-07 VITALS — BP 118/70 | HR 52 | Temp 98.0°F | Ht 68.0 in | Wt 156.4 lb

## 2024-07-07 DIAGNOSIS — Z0000 Encounter for general adult medical examination without abnormal findings: Secondary | ICD-10-CM

## 2024-07-07 DIAGNOSIS — N951 Menopausal and female climacteric states: Secondary | ICD-10-CM

## 2024-07-07 DIAGNOSIS — Z803 Family history of malignant neoplasm of breast: Secondary | ICD-10-CM

## 2024-07-07 DIAGNOSIS — Z1211 Encounter for screening for malignant neoplasm of colon: Secondary | ICD-10-CM | POA: Diagnosis not present

## 2024-07-07 DIAGNOSIS — R7309 Other abnormal glucose: Secondary | ICD-10-CM

## 2024-07-07 DIAGNOSIS — Z8041 Family history of malignant neoplasm of ovary: Secondary | ICD-10-CM

## 2024-07-07 DIAGNOSIS — N912 Amenorrhea, unspecified: Secondary | ICD-10-CM | POA: Diagnosis not present

## 2024-07-07 DIAGNOSIS — E7800 Pure hypercholesterolemia, unspecified: Secondary | ICD-10-CM

## 2024-07-07 DIAGNOSIS — Z23 Encounter for immunization: Secondary | ICD-10-CM

## 2024-07-07 LAB — CBC WITH DIFFERENTIAL/PLATELET
Basophils Absolute: 0 K/uL (ref 0.0–0.1)
Basophils Relative: 0.6 % (ref 0.0–3.0)
Eosinophils Absolute: 0.1 K/uL (ref 0.0–0.7)
Eosinophils Relative: 2.4 % (ref 0.0–5.0)
HCT: 40.1 % (ref 36.0–46.0)
Hemoglobin: 13.4 g/dL (ref 12.0–15.0)
Lymphocytes Relative: 43.7 % (ref 12.0–46.0)
Lymphs Abs: 2.7 K/uL (ref 0.7–4.0)
MCHC: 33.3 g/dL (ref 30.0–36.0)
MCV: 93 fl (ref 78.0–100.0)
Monocytes Absolute: 0.6 K/uL (ref 0.1–1.0)
Monocytes Relative: 10 % (ref 3.0–12.0)
Neutro Abs: 2.7 K/uL (ref 1.4–7.7)
Neutrophils Relative %: 43.3 % (ref 43.0–77.0)
Platelets: 265 K/uL (ref 150.0–400.0)
RBC: 4.32 Mil/uL (ref 3.87–5.11)
RDW: 12.7 % (ref 11.5–15.5)
WBC: 6.2 K/uL (ref 4.0–10.5)

## 2024-07-07 LAB — COMPREHENSIVE METABOLIC PANEL WITH GFR
ALT: 19 U/L (ref 0–35)
AST: 21 U/L (ref 0–37)
Albumin: 4.5 g/dL (ref 3.5–5.2)
Alkaline Phosphatase: 54 U/L (ref 39–117)
BUN: 15 mg/dL (ref 6–23)
CO2: 35 meq/L — ABNORMAL HIGH (ref 19–32)
Calcium: 9.9 mg/dL (ref 8.4–10.5)
Chloride: 101 meq/L (ref 96–112)
Creatinine, Ser: 0.97 mg/dL (ref 0.40–1.20)
GFR: 65.91 mL/min (ref >60.00)
Glucose, Bld: 83 mg/dL (ref 70–99)
Potassium: 3.8 meq/L (ref 3.5–5.1)
Sodium: 141 meq/L (ref 135–145)
Total Bilirubin: 0.7 mg/dL (ref 0.2–1.2)
Total Protein: 7.1 g/dL (ref 6.0–8.3)

## 2024-07-07 LAB — LIPID PANEL
Cholesterol: 160 mg/dL (ref 0–200)
HDL: 58.8 mg/dL (ref >39.00)
LDL Cholesterol: 80 mg/dL (ref 0–99)
NonHDL: 100.75
Total CHOL/HDL Ratio: 3
Triglycerides: 102 mg/dL (ref 0.0–149.0)
VLDL: 20.4 mg/dL (ref 0.0–40.0)

## 2024-07-07 LAB — HEMOGLOBIN A1C: Hgb A1c MFr Bld: 5.9 % (ref 4.6–6.5)

## 2024-07-07 LAB — TSH: TSH: 3.77 u[IU]/mL (ref 0.35–5.50)

## 2024-07-07 NOTE — Assessment & Plan Note (Signed)
Colonoscopy 04/2022

## 2024-07-07 NOTE — Assessment & Plan Note (Signed)
Under care of cardiology  Past surg from coarctation of aorta  Taking metoprolol xl 50 mg daily  No symptoms or clinical changes

## 2024-07-07 NOTE — Progress Notes (Signed)
 Subjective:    Patient ID: Elizabeth Hayden, female    DOB: 1971/03/24, 53 y.o.   MRN: [id number]  HPI  Here for health maintenance exam and to review chronic medical problems   Wt Readings from Last 3 Encounters:  07/07/24 156 lb 6 oz (70.9 kg)  06/28/24 155 lb (70.3 kg)  05/31/24 155 lb (70.3 kg)   23.78 kg/m  Vitals:   07/07/24 0827  BP: 118/70  Pulse: (!) 52  Temp: 98 F (36.7 C)  SpO2: 98%    Immunization History  Administered Date(s) Administered   H1N1 11/13/2008   Hepatitis A, Adult 04/23/2017   Influenza Split 07/25/2012   Influenza Whole 07/15/2007   Influenza, Seasonal, Injecte, Preservative Fre 07/07/2023   Influenza,inj,Quad PF,6+ Mos 08/01/2013, 09/08/2014, 07/28/2017, 08/06/2018, 06/23/2019, 07/03/2022   Janssen (J&J) SARS-COV-2 Vaccination 01/17/2020   Td 04/18/2004   Tdap 10/19/2014    Health Maintenance Due  Topic Date Due   Hepatitis C Screening  Never done   Pneumococcal Vaccine: 50+ Years (1 of 2 - PCV) Never done   Hepatitis B Vaccines 19-59 Average Risk (1 of 3 - 19+ 3-dose series) Never done   Zoster Vaccines- Shingrix (1 of 2) Never done   Influenza Vaccine  05/12/2024   Had a knee injury  Cortisone shot Tuesday MRI no tear , mild chondromalacia   Flu shot  Will do today   [facility] - [facility]   Mammogram 12/2023 Self breast exam- no lumps today (generally lumpy)  Fibrocystic breasts    Gyn health Pap 03/2022  Period in nov and then another one last month (sept 3)  Perimenopausal   Gabapentin  for menopausal symptoms  Takes 300 mg at night - helps some    Colon cancer screening  Colonoscopy 04/2022 with 3 y recall  Bone health   Falls- none  Fractures-none  Supplements ca and D    Exercise  Zumba  Muscle building class  Is careful with the knee    Mood    07/07/2024    8:33 AM 07/07/2023    9:39 AM 05/07/2023   12:48 PM 07/03/2022    9:38 AM 07/01/2021    9:53 AM  Depression screen PHQ 2/9  Decreased  Interest 1 1 0 0 1  Down, Depressed, Hopeless 1 1 1 1 1   PHQ - 2 Score 2 2 1 1 2   Altered sleeping 1 1 2 1 2   Tired, decreased energy 1 2 1 1 1   Change in appetite 1 2 0 1 2  Feeling bad or failure about yourself  1 1 1 2 2   Trouble concentrating 0 1 0 2 1  Moving slowly or fidgety/restless 0 0 0 1 0  Suicidal thoughts 0 0 0 1 1  PHQ-9 Score 6 9 5 10 11   Difficult doing work/chores Not difficult at all Somewhat difficult Not difficult at all Not difficult at all Somewhat difficult   Good and bad days  Stress is fairly steady  / less stressful job  Bupropion  XL 450 mg daily    History of cardiomyopathy / coarctation of aorta and aneurysm with surgery in the past On metoprolol  xl 50 mg daily  BP Readings from Last 3 Encounters:  07/07/24 118/70  06/07/24 139/81  01/21/24 102/72   Pulse Readings from Last 3 Encounters:  07/07/24 (!) 52  06/07/24 64  01/21/24 68      Glucose Lab Results  Component Value Date   HGBA1C 5.6  07/02/2023   HGBA1C 5.8 (H) 07/01/2022   HGBA1C 5.7 (H) 06/20/2021   Hyperlipidemia Lab Results  Component Value Date   CHOL 171 07/02/2023   HDL 69 07/02/2023   LDLCALC 88 07/02/2023   TRIG 77 07/02/2023   CHOLHDL 2.5 07/02/2023   Due for labs  Crestor  5 mg daily      Patient Active Problem List   Diagnosis Date Noted   Symptoms, such as flushing, sleeplessness, headache, lack of concentration, associated with the menopause 05/07/2023   Amenorrhea 05/07/2023   Stress reaction 05/07/2023   Contusion of right hand 02/23/2021   Recurrent UTI 06/26/2020   History of abnormal cervical Pap smear 08/23/2019   Colon cancer screening 06/15/2019   Hyperlipidemia 06/15/2019   Elevated glucose level 03/25/2018   Family history of ovarian cancer 06/08/2017   Family history of breast cancer 06/08/2017   H/O fracture of wrist 06/02/2017   Coarctation of aorta 11/24/2011   Aortic aneurysm 11/24/2011   Encounter for gynecological examination  05/19/2011   Routine general medical examination at a health care facility 05/19/2011   History of eating disorder 11/02/2007   Dysthymia 11/02/2007   CARDIOMYOPATHY, HYPERTROPHIC, OBSTRUCTIVE 11/02/2007   Allergic rhinitis 11/02/2007   BICUSPID AORTIC VALVE 11/02/2007   Past Medical History:  Diagnosis Date   Allergy 2004   altace   Aortic root dilation    Colon polyp 2020   Congenital heart disease    bicuspid aortic valve with coarctation repair 04/1974   Depression    Edema    Fibrocystic breast    GERD (gastroesophageal reflux disease)    takes Zantac daily as needed   HPV (human papilloma virus) infection    Seizures (HCC)    as a baby and only 1 time d/t high fever   Shortness of breath dyspnea    with exertion   Sinus infection    Nov 16 and treated with antibiotic   TMJ (dislocation of temporomandibular joint)    Wears a night gear    UTI (lower urinary tract infection)    hx of    Yeast infection    08/17/14   Past Surgical History:  Procedure Laterality Date   ASCENDING AORTIC ROOT REPLACEMENT N/A 09/03/2014   Procedure: ASCENDING AORTIC ROOT REPLACEMENT & VALVE SPARING ROOT;  Surgeon: Dorise MARLA Fellers, MD;  Location: [facility] OR;  Service: Open Heart Surgery;  Laterality: N/A;  CIRC ARREST   COARCTATION OF AORTA EXCISION  1975   COLONOSCOPY  08/2019   [facility] in [place], repeat after 5 yrs   COLPOSCOPY  04/26/2015   CRYOTHERAPY  2009   EYE SURGERY     EYE SURGERY Bilateral 1985   INTRAOPERATIVE TRANSESOPHAGEAL ECHOCARDIOGRAM N/A 09/03/2014   Procedure: INTRAOPERATIVE TRANSESOPHAGEAL ECHOCARDIOGRAM;  Surgeon: Dorise MARLA Fellers, MD;  Location: [facility] OR;  Service: Open Heart Surgery;  Laterality: N/A;   LEFT HEART CATHETERIZATION WITH CORONARY ANGIOGRAM N/A 08/06/2014   Procedure: LEFT HEART CATHETERIZATION WITH CORONARY ANGIOGRAM;  Surgeon: Maude JAYSON Emmer, MD;  Location: [facility] CATH LAB;  Service: Cardiovascular;  Laterality: N/A;   TEE WITHOUT CARDIOVERSION N/A 08/06/2014    Procedure: TRANSESOPHAGEAL ECHOCARDIOGRAM (TEE);  Surgeon: Maude JAYSON Emmer, MD;  Location: [facility] ENDOSCOPY;  Service: Cardiovascular;  Laterality: N/A;   Social History   Tobacco Use   Smoking status: Never   Smokeless tobacco: Never  Vaping Use   Vaping status: Never Used  Substance Use Topics   Alcohol use: Yes    Comment: occasional  Drug use: No   Family History  Problem Relation Age of Onset   Hypertension Father    Diabetes Father        DM type 2   Cancer Father        lung    Breast cancer Maternal Grandmother        30s   Leukemia Maternal Grandmother    Ovarian cancer Paternal Grandmother        55s, malignant   Celiac disease Son    Down syndrome Son    Intellectual disability Son    Learning disabilities Son    Allergies  Allergen Reactions   Ace Inhibitors     REACTION: rash and cough   Altace [Ramipril] Hives and Itching   Current Outpatient Medications on File Prior to Visit  Medication Sig Dispense Refill   Biotin w/ Vitamins C & E (HAIR SKIN & NAILS GUMMIES PO) Take 1 tablet by mouth daily.     buPROPion  HCl ER, XL, 450 MG TB24 TAKE 1 TABLET DAILY 90 tablet 0   CALCIUM -VITAMIN D PO Take 2 tablets by mouth daily.     diclofenac  Sodium (VOLTAREN  ARTHRITIS PAIN) 1 % GEL Apply 2 g topically 4 (four) times daily. 50 g 0   gabapentin  (NEURONTIN ) 100 MG capsule Take 1 capsule (100 mg total) by mouth in the morning. 90 capsule 0   gabapentin  (NEURONTIN ) 300 MG capsule TAKE 1 CAPSULE IN THE      MORNING (Patient taking differently: Take 300 mg by mouth at bedtime.) 90 capsule 0   metoprolol  succinate (TOPROL -XL) 50 MG 24 hr tablet Take 1 tablet (50 mg total) by mouth daily with or immediately following a meal. 90 tablet 3   rosuvastatin  (CRESTOR ) 5 MG tablet Take 1 tablet (5 mg total) by mouth daily. 90 tablet 3   No current facility-administered medications on file prior to visit.    Review of Systems  Constitutional:  Negative for activity change, appetite  change, fatigue, fever and unexpected weight change.  HENT:  Negative for congestion, ear pain, rhinorrhea, sinus pressure and sore throat.   Eyes:  Negative for pain, redness and visual disturbance.  Respiratory:  Negative for cough, shortness of breath and wheezing.   Cardiovascular:  Negative for chest pain and palpitations.  Gastrointestinal:  Negative for abdominal pain, blood in stool, constipation and diarrhea.  Endocrine: Positive for heat intolerance. Negative for polydipsia and polyuria.       Menopausal hot flashes on /off   Genitourinary:  Negative for dysuria, frequency and urgency.  Musculoskeletal:  Negative for arthralgias, back pain and myalgias.  Skin:  Negative for pallor and rash.  Allergic/Immunologic: Negative for environmental allergies.  Neurological:  Negative for dizziness, syncope and headaches.  Hematological:  Negative for adenopathy. Does not bruise/bleed easily.  Psychiatric/Behavioral:  Negative for decreased concentration and dysphoric mood. The patient is not nervous/anxious.        Stress and mood -up and down       Objective:   Physical Exam Constitutional:      General: She is not in acute distress.    Appearance: Normal appearance. She is well-developed and normal weight. She is not ill-appearing or diaphoretic.  HENT:     Head: Normocephalic and atraumatic.     Right Ear: Tympanic membrane, ear canal and external ear normal.     Left Ear: Tympanic membrane, ear canal and external ear normal.     Nose: Nose normal. No congestion.  Mouth/Throat:     Mouth: Mucous membranes are moist.     Pharynx: Oropharynx is clear. No posterior oropharyngeal erythema.  Eyes:     General: No scleral icterus.    Extraocular Movements: Extraocular movements intact.     Conjunctiva/sclera: Conjunctivae normal.     Pupils: Pupils are equal, round, and reactive to light.  Neck:     Thyroid: No thyromegaly.     Vascular: No carotid bruit or JVD.   Cardiovascular:     Rate and Rhythm: Normal rate and regular rhythm.     Pulses: Normal pulses.     Heart sounds: Normal heart sounds.     No gallop.  Pulmonary:     Effort: Pulmonary effort is normal. No respiratory distress.     Breath sounds: Normal breath sounds. No wheezing.     Comments: Good air exch Chest:     Chest wall: No tenderness.  Abdominal:     General: Bowel sounds are normal. There is no distension or abdominal bruit.     Palpations: Abdomen is soft. There is no mass.     Tenderness: There is no abdominal tenderness.     Hernia: No hernia is present.  Genitourinary:    Comments: Breast exam: No mass, nodules, thickening, tenderness, bulging, retraction, inflamation, nipple discharge or skin changes noted.  No axillary or clavicular LA.     Musculoskeletal:        General: No tenderness. Normal range of motion.     Cervical back: Normal range of motion and neck supple. No rigidity. No muscular tenderness.     Right lower leg: No edema.     Left lower leg: No edema.     Comments: No kyphosis   Lymphadenopathy:     Cervical: No cervical adenopathy.  Skin:    General: Skin is warm and dry.     Coloration: Skin is not pale.     Findings: No erythema or rash.     Comments: Solar lentigines diffusely   Mid sternal scar Scar over left scapula area   Neurological:     Mental Status: She is alert. Mental status is at baseline.     Cranial Nerves: No cranial nerve deficit.     Motor: No abnormal muscle tone.     Coordination: Coordination normal.     Gait: Gait normal.     Deep Tendon Reflexes: Reflexes are normal and symmetric. Reflexes normal.  Psychiatric:        Mood and Affect: Mood normal.        Cognition and Memory: Cognition and memory normal.           Assessment & Plan:   Problem List Items Addressed This Visit       Other   Symptoms, such as flushing, sleeplessness, headache, lack of concentration, associated with the menopause    Continues gabapentin  100 amd 300 pm  Helps some  Irregular menses        Routine general medical examination at a health care facility - Primary   Reviewed health habits including diet and exercise and skin cancer prevention Reviewed appropriate screening tests for age  Also reviewed health mt list, fam hx and immunization status , as well as social and family history   See HPI Labs reviewed and ordered Health Maintenance  Topic Date Due   Hepatitis C Screening  Never done   Pneumococcal Vaccine for age over 59 (1 of 2 - PCV) Never done  Hepatitis B Vaccine (1 of 3 - 19+ 3-dose series) Never done   Zoster (Shingles) Vaccine (1 of 2) Never done   Flu Shot  05/12/2024   COVID-19 Vaccine (2 - 2025-26 season) 07/07/2028*   DTaP/Tdap/Td vaccine (3 - Td or Tdap) 10/19/2024   Breast Cancer Screening  01/02/2025   Colon Cancer Screening  05/08/2025   Pap with HPV screening  03/25/2027   HIV Screening  Completed   HPV Vaccine  Aged Out   Meningitis B Vaccine  Aged Out  *Topic was postponed. The date shown is not the original due date.    Flu shot today  Discussed fall prevention, supplements and exercise for bone density  Perimneopausal  PHQ 6 with treatment /in midst of stress and hormone change  Labs today      Relevant Orders   TSH   Lipid Panel   Comprehensive metabolic panel with GFR   CBC with Differential/Platelet   Hyperlipidemia   Disc goals for lipids and reasons to control them Rev last labs with pt Rev low sat fat diet in detail Labs today  Continues crestor  5 mg daily      Relevant Orders   Lipid Panel   Comprehensive metabolic panel with GFR   Family history of ovarian cancer   Negative genetic screen for [facility] and others       Family history of breast cancer   Neg genetic screen for [facility] and others       Elevated glucose level   A1c ordered  Encouraged low glycemic diet with lean protein      Relevant Orders   Hemoglobin A1c   Colon cancer  screening   Colonoscopy 04/2022       Amenorrhea   After about 9 months without menses had a period early this month  Some menopausal symptoms  Gabapentin  is helpful 100 am and 300 pm      Other Visit Diagnoses       Need for influenza vaccination       Relevant Orders   Flu vaccine trivalent PF, 6mos and older(Flulaval,Afluria,Fluarix,Fluzone)

## 2024-07-07 NOTE — Patient Instructions (Signed)
 Take care of yourself   Keep up the good exercise (knee friendly if needed)   Keep up with fluids   Labs today   Flu shot today

## 2024-07-07 NOTE — Assessment & Plan Note (Signed)
 Reviewed health habits including diet and exercise and skin cancer prevention Reviewed appropriate screening tests for age  Also reviewed health mt list, fam hx and immunization status , as well as social and family history   See HPI Labs reviewed and ordered Health Maintenance  Topic Date Due   Hepatitis C Screening  Never done   Pneumococcal Vaccine for age over 19 (1 of 2 - PCV) Never done   Hepatitis B Vaccine (1 of 3 - 19+ 3-dose series) Never done   Zoster (Shingles) Vaccine (1 of 2) Never done   Flu Shot  05/12/2024   COVID-19 Vaccine (2 - 2025-26 season) 07/07/2028*   DTaP/Tdap/Td vaccine (3 - Td or Tdap) 10/19/2024   Breast Cancer Screening  01/02/2025   Colon Cancer Screening  05/08/2025   Pap with HPV screening  03/25/2027   HIV Screening  Completed   HPV Vaccine  Aged Out   Meningitis B Vaccine  Aged Out  *Topic was postponed. The date shown is not the original due date.    Flu shot today  Discussed fall prevention, supplements and exercise for bone density  Perimneopausal  PHQ 6 with treatment /in midst of stress and hormone change  Labs today

## 2024-07-07 NOTE — Assessment & Plan Note (Signed)
 A1c ordered  Encouraged low glycemic diet with lean protein

## 2024-07-07 NOTE — Assessment & Plan Note (Signed)
 Continues gabapentin  100 amd 300 pm  Helps some  Irregular menses

## 2024-07-07 NOTE — Assessment & Plan Note (Signed)
 Neg genetic screen for [facility] and others

## 2024-07-07 NOTE — Assessment & Plan Note (Signed)
 After about 9 months without menses had a period early this month  Some menopausal symptoms  Gabapentin  is helpful 100 am and 300 pm

## 2024-07-07 NOTE — Assessment & Plan Note (Signed)
 Disc goals for lipids and reasons to control them Rev last labs with pt Rev low sat fat diet in detail Labs today  Continues crestor  5 mg daily

## 2024-07-07 NOTE — Assessment & Plan Note (Signed)
 Negative genetic screen for [facility] and others

## 2024-07-09 ENCOUNTER — Ambulatory Visit: Payer: Self-pay | Admitting: Family Medicine

## 2024-07-27 ENCOUNTER — Telehealth: Payer: Self-pay

## 2024-07-27 NOTE — Telephone Encounter (Signed)
 CALLED TO CANCEL ORTHOTIC PU APPT. ORTHOTICS NOT IN (HELD AT CUSTOMS) I WILL CALL PATIENT WHEN IN

## 2024-07-28 ENCOUNTER — Other Ambulatory Visit

## 2024-08-01 ENCOUNTER — Other Ambulatory Visit: Payer: Self-pay | Admitting: Family Medicine

## 2024-08-29 ENCOUNTER — Other Ambulatory Visit: Payer: Self-pay | Admitting: Family Medicine

## 2024-08-29 NOTE — Telephone Encounter (Signed)
 Gabapentin  100 mg filled on 06/13/24 #90 caps/ 0 refills  Gabapentin  300 mg filled on 06/14/24 #90 caps/ 0 refills   CPE was on 07/07/24

## 2024-09-04 ENCOUNTER — Other Ambulatory Visit (HOSPITAL_COMMUNITY): Payer: Self-pay

## 2024-09-04 ENCOUNTER — Telehealth: Payer: Self-pay

## 2024-09-04 NOTE — Telephone Encounter (Signed)
 Pharmacy Patient Advocate Encounter  Received notification from [facility] that Prior Authorization for Bupropion  Hcl ER (XR) 450 tabs has been APPROVED from 09/04/24 to 09/04/25. Unable to obtain price due to refill too soon rejection, last fill date 08/07/24 next available fill date1/7/26   PA #/Case ID/Reference #: # [id number]

## 2024-09-04 NOTE — Telephone Encounter (Signed)
 Pharmacy Patient Advocate Encounter   Received notification from Onbase that prior authorization for Bupropion  HCL ER (XR) 450 tabs is required/requested.   Insurance verification completed.   The patient is insured through [facility].   Per test claim: PA required; PA submitted to above mentioned insurance via Latent Key/confirmation #/EOC [id number] Status is pending

## 2025-02-16 ENCOUNTER — Ambulatory Visit: Admitting: Cardiovascular Disease
